# Patient Record
Sex: Male | Born: 1945
Health system: Southern US, Community
[De-identification: ages and names within clinical notes are randomized; demographics above are authoritative.]

## PROBLEM LIST (undated history)

## (undated) DIAGNOSIS — H409 Unspecified glaucoma: Secondary | ICD-10-CM

## (undated) DIAGNOSIS — G4730 Sleep apnea, unspecified: Secondary | ICD-10-CM

## (undated) DIAGNOSIS — M1990 Unspecified osteoarthritis, unspecified site: Secondary | ICD-10-CM

## (undated) DIAGNOSIS — F419 Anxiety disorder, unspecified: Secondary | ICD-10-CM

## (undated) DIAGNOSIS — J449 Chronic obstructive pulmonary disease, unspecified: Secondary | ICD-10-CM

## (undated) DIAGNOSIS — F32A Depression, unspecified: Secondary | ICD-10-CM

## (undated) DIAGNOSIS — N400 Enlarged prostate without lower urinary tract symptoms: Secondary | ICD-10-CM

## (undated) DIAGNOSIS — K219 Gastro-esophageal reflux disease without esophagitis: Secondary | ICD-10-CM

## (undated) DIAGNOSIS — I10 Essential (primary) hypertension: Secondary | ICD-10-CM

## (undated) DIAGNOSIS — T7840XA Allergy, unspecified, initial encounter: Secondary | ICD-10-CM

## (undated) HISTORY — PX: HERNIA REPAIR: SHX51

## (undated) HISTORY — DX: Unspecified glaucoma: H40.9

## (undated) HISTORY — DX: Depression, unspecified: F32.A

## (undated) HISTORY — DX: Anxiety disorder, unspecified: F41.9

## (undated) HISTORY — DX: Allergy, unspecified, initial encounter: T78.40XA

## (undated) HISTORY — PX: KNEE ARTHROSCOPY: SUR90

## (undated) HISTORY — PX: OTHER SURGICAL HISTORY: SHX169

## (undated) HISTORY — PX: COLONOSCOPY: SHX174

## (undated) HISTORY — DX: Essential (primary) hypertension: I10

## (undated) HISTORY — DX: Sleep apnea, unspecified: G47.30

## (undated) HISTORY — PX: SHOULDER SURGERY: SHX246

## (undated) HISTORY — PX: APPENDECTOMY: SHX54

---

## 1999-07-06 ENCOUNTER — Emergency Department (HOSPITAL_COMMUNITY): Admission: EM | Admit: 1999-07-06 | Discharge: 1999-07-06 | Payer: Self-pay | Admitting: Emergency Medicine

## 2004-06-02 ENCOUNTER — Ambulatory Visit (HOSPITAL_COMMUNITY): Admission: RE | Admit: 2004-06-02 | Discharge: 2004-06-02 | Payer: Self-pay | Admitting: Internal Medicine

## 2006-02-06 ENCOUNTER — Inpatient Hospital Stay (HOSPITAL_COMMUNITY): Admission: EM | Admit: 2006-02-06 | Discharge: 2006-02-07 | Payer: Self-pay | Admitting: Emergency Medicine

## 2006-04-09 ENCOUNTER — Ambulatory Visit (HOSPITAL_COMMUNITY): Admission: RE | Admit: 2006-04-09 | Discharge: 2006-04-09 | Payer: Self-pay | Admitting: *Deleted

## 2007-08-25 ENCOUNTER — Ambulatory Visit (HOSPITAL_COMMUNITY): Admission: RE | Admit: 2007-08-25 | Discharge: 2007-08-25 | Payer: Self-pay | Admitting: Orthopedic Surgery

## 2010-06-26 ENCOUNTER — Encounter: Payer: Self-pay | Admitting: Internal Medicine

## 2010-10-20 NOTE — Consult Note (Signed)
Robert, Adams NO.:  [id number]   MEDICAL RECORD NO.:  [id number]          PATIENT TYPE:  INP   LOCATION:  4736                         FACILITY:  [facility]   PHYSICIAN:  Peter M. Swaziland, M.D.  DATE OF BIRTH:  Jan 14, 1946   DATE OF CONSULTATION:  02/07/2006  DATE OF DISCHARGE:                                   CONSULTATION   HISTORY OF PRESENT ILLNESS:  Robert Adams is a very pleasant 65 year old white  male, who presented yesterday for evaluation of chest pain.  He was out  working in his workshop when he developed sudden onset of a sharp pain  localized to his right lower anterior rib cage.  The patient states he just  sat down for 20 minutes and after proximal 25 minutes, his pain resolved.  He has had no further chest pain since then.  He did have mild discomfort in  his right cheek.  He denied any diaphoresis, nausea, vomiting, and had only  mild shortness of breath.  The patient states he has had prior cardiac work-  up with a cardiac catheterization in 2003 when he was in [place],  [place].  It was noted that he had some mild plaque at that time, up to  30% but no obstructive disease.  He reports he had a routine stress test 1  year ago with Dr. Ricki Miller and that this was normal.  He has no known history of  hypercholesterolemia or diabetes or hypertension.  He is a smoker.   PAST MEDICAL HISTORY:  1. Tobacco use.  2. Chronic rash in his groin.  3. Status post right shoulder and knee surgery.  4. Status post appendectomy.  5. History of hypoglycemia.  6. History of asbestosis and radiation exposure.  7. BPH.   His prior medications include Flomax 0.4 mg daily.  He has no known  allergies.   FAMILY HISTORY:  Both parents died of natural causes.  He has 2 siblings  with atrial fibrillation.   SOCIAL HISTORY:  The patient lives with his wife.  He is retired.  He  previously worked as a Academic librarian.  He has had previous asbestos exposure.  He  smokes 1 pack per day since his teenage years.  He denies alcohol use.   REVIEW OF SYSTEMS:  He has had recent weight loss of 15-20 pounds, and he  has had a chronic rash in his groin.  He has history of chronic insomnia.  Other review of systems are negative.   PHYSICAL EXAM:  GENERAL:  The patient is pleasant white male in no apparent  distress.  VITAL SIGNS:  His blood pressure is 130/80, his pulse is 70 and regular, his  respirations are 18, he is afebrile.  Saturations are 99% on room air.  HEENT:  He is normocephalic and atraumatic.  Pupils equal, round, reactive  to light and accommodation.  He wears a moustache.  His oropharynx is clear.  NECK:  Supple without JVD, adenopathy, thyromegaly or bruits.  LUNGS:  Clear to auscultation and percussion.  CARDIAC:  Regular rate and rhythm  without gallop, murmur, rub or click.  ABDOMEN:  Soft, nontender, without as splenomegaly, masses or bruits.  EXTREMITIES:  His femoral and pedal pulses were 2+ and symmetric.  He has no  lower extremity edema.   LABORATORY DATA:  His ECG shows normal sinus rhythm.  There is a question of  Q-waves in lead V2.  Cannot rule out septal infarct, age undetermined.  There are no serial ECG changes, and he has minor nonspecific ST  abnormality.  Chest x-ray showed some atelectasis in the bases.  He had a CT  of chest which showed bilateral lower lobe scarring and right middle lobe  scarring without active disease.  Additional laboratory data, his  electrolytes were normal.  Renal function is normal.  His CBC was normal.  Coags were normal.  He has had 5 sets of cardiac enzymes, all of which are  normal.   IMPRESSION:  1. Atypical chest pain.  The patient is ruled out for myocardial      infarction.  Cardiac evaluation in the past has been unremarkable.  2. Tobacco use.  3. Chronic pulmonary scarring.  4. Chronic rash in the groin  5. Weight loss.   PLAN:  I think it would be appropriate to let Mr.  Adams be discharged home  today.  We have counseled him on smoking cessation.  We can arrange him to  have a follow-up stress Cardiolite study as an outpatient and will call to  arrange this.           ______________________________  Peter M. Swaziland, M.D.     PMJ/MEDQ  D:  02/07/2006  T:  02/07/2006  Job:  [id number]   cc:   Isidor Holts, M.D.  Juline Patch, M.D.

## 2010-10-20 NOTE — H&P (Signed)
NAMEHYLAND, MOLLENKOPF NO.:  [id number]   MEDICAL RECORD NO.:  [id number]          PATIENT TYPE:  EMS   LOCATION:  MAJO                         FACILITY:  [facility]   PHYSICIAN:  Lonia Blood, M.D.DATE OF BIRTH:  [birth year]   DATE OF ADMISSION:  02/06/2006  DATE OF DISCHARGE:                                HISTORY & PHYSICAL   PRIMARY CARE PHYSICIAN:  Juline Patch, M.D.   CHIEF COMPLAINT:  Chest pain.   HISTORY OF PRESENT ILLNESS:  [name] is a very pleasant 65-year-  old gentleman. He reports the acute onset this morning of a right-sided  stabbing chest pain.  This occurred while he was out working in his shop.  He denies that he was physically exerting himself but he was moving around.  The pain was sudden.  The pain radiated up into his right neck and into his  jaw.  This pain was associated with the sudden sense of dyspnea and mild  diaphoresis.  The pain seemed to get better when the patient sat down to  rest.  After approximately 20-30 minutes the patient seemed to have resolved  completely.  He reported the symptoms to his wife who then encouraged him to  seek further attention in the emergency room.  When I asked the patient if  he has had recent symptoms,  he denies.  He does report,  however, that he  has been losing weight unintentionally.  He states he has lost anywhere from  15-20 pounds since January.  He states he simply just does not have an  appetite.  He does not have any melena or hematochezia.  He has not had  abdominal pain.  There has not been hot or cold intolerance.  There has not  been any change in his hair or skin texture.  The patient simply reports  that he has not had much of an appetite.  Wife does support the fact that he  has lost significant weight.  She also states he has very little energy and  just does not seem himself lately.  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  He reports that he was exposed to asbestos on a daily basis for greater than  10 years.  He has also smoked since he was a teenager.  He has no known  history of diabetes but is hyperglycemic in the emergency room.  His lipid  status is unknown.  His family history is not significant for coronary  disease.  At present he feels much better.  He is having no pain.  He is  having no trouble breathing.  He states he is at his baseline.   Of note, the patient does report, however, that he has been told that he  had a heart attack in the past although he was not aware of this.  He also  had a hospitalization in [place] back in 2003 with the diagnosis of  chest pain.  At that time he apparently  underwent a cardiac catheterization  and was told that he had some mild blockages but nothing that needed  treatment then.  He has not sought cardiac evaluation or medical attention  concerning his coronary disease since that time.   REVIEW OF SYSTEMS:  Comprehensive review of systems is unremarkable with the  exception of the multiple positive illnesses noted above.  Additionally, the  patient does report a chronic erythematous, pruritic rash in his groin and  on kissing regions of the medial thigh.  He states that this has been  present since he was in the [place] but much worse actually since the 90s.  He states he has seen many, many physicians including multiple  dermatologists as well as dermatology departments at [facility] and at [facility] and that no one has been able to diagnose this appropriately.  He  states he has been on numerous courses of antifungals, anti-inflammatories  and steroids, both topical and systemic and that none of them make any  difference.   PAST MEDICAL HISTORY:  1. Tobacco abuse, one pack per day since teen years.  2. Chronic rash to the groin as discussed above.  3. Status post right shoulder and bilateral knee orthopedic procedures.  4.  Status post bilateral inguinal hernia repairs.  5. Status post appendectomy.  6. Cardiac catheterization in 2003 in [place] with report of mild      blockage.  7. 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   MEDICATIONS:  Flomax 0.4 mg p.o. q.d.   ALLERGIES:  NO KNOWN DRUG ALLERGIES.   FAMILY HISTORY:  Mother is deceased from natural causes, father is deceased  from natural causes.  Patient has a brother and sister, both suffer with  atrial fibrillation but none of whom have a history of coronary artery  disease.  There is no known history of early coronary disease in the  patient's extended family.   SOCIAL HISTORY/OCCUPATION:  He is married. He lives in [place] with his  wife.  He  retired from Building [employer] in January of this year.  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  He  also served in [employer] in this capacity.   DATA:  Sodium, potassium, chloride, bicarb, BUN and creatinine are normal.  Serum glucose is elevated at 115; pH is 7.39, pcO2 is 41, pO2 is not  obtained on a venous scan.  Myoglobin, CK-MB and troponin were all negative.  Chest x-ray reveals bibasilar plate-like atelectasis versus scarring;  12-  lead EKG is normal sinus rhythm at 68 beats-per-minute with ST depression  notable in leads II and aVF as well as Q waves which are minimally  significant in lead I and definitely significant in aVL with J point type  elevation in leads V1 through V3.   PHYSICAL EXAMINATION:  VITAL SIGNS:  Temperature 97.2, blood pressure  131/80, heart rate 71, respiratory rate 18, O2 sat 99% on room air.  GENERAL:  Well-developed, well-nourished male in no acute respiratory  distress at the present time.  HEENT:  Normocephalic, atraumatic. Pupils equal, round and reactive to light and accommodation.  Extraocular muscles intact bilaterally.  OC/OP  clear.  NECK:  No JVD, no lymphadenopathy, no thyromegaly.  LUNGS:  Clear to auscultation throughout although breath sounds are distant.  There are no focal crackles, there are no wheezes at present.  CARDIOVASCULAR:  Regular rate and  rhythm without murmurs, rubs, or gallops.  Normal S1 and S2.  ABDOMEN:  Nontender, nondistended, soft, bowel sounds present, no  hepatosplenomegaly.  No rebound, no ascites.  EXTREMITIES:  No significant clubbing, cyanosis or edema bilateral lower  extremities.  CUTANEOUS:  The patient does have a macular erythematous rash in the groin  on the scrotum spreading onto the base of the penis and also within the  groin and spreading onto bilateral medial thighs.  There is no evidence of  abscess formation.  There is no purulent discharge.  There is no fluctuans.  There is no apparent break of the skin.  NEUROLOGIC:  Cranial nerves II through XII are intact, Strenth is 5/5  strength bilateral upper and lower extremities. Alert and oriented x4, no  Babinski, 1+ DTRs throughout.   IMPRESSION AND PLAN:  1. Chest pain--Robert Adams's chest pain is in fact atypical.  He describes      it as a sharp and stabbing type pain.  Nonetheless it did radiate to      the face and was associated with diaphoresis and shortness of breath.      It improved when  he rested.  Given his questionable history of mild      coronary disease in the past, I am concern that this could, in fact,      represent coronary disease and an anginal equivalent.  His EKG is also      somewhat alarming.  I feel it is in his best interest for [place] to      evaluate him with rule out using serial EKGs and enzymes.  I am not      sufficiently concerned that this is unstable angina that I am dosing      the patient with full dose heparin or nitroglycerin.  I will      empirically place him on a beta blocker, ACE inhibitor and aspirin      therapy.  Lovenox will be dosed for DVT prophylaxis alone.  If  cardiac      enzymes are positive, the patient may require risk stratification      during his hospital stay.  2. 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  He has a      longstanding history of asbestos exposure and also smokes.  Given his      right-sided atypical chest pain and his recent history of weight loss,      I am concerned that the patient could be suffering with      asbestosis/mesothelioma or even a primary lung cancer.  I will scan the      patient's chest for further evaluation.  3. Hyperglycemia--the patient has no known history of diabetes mellitus.      His blood sugar is 115 on a serum glucose.  This is markedly elevated.      We will check a hemoglobin A1c.  If this is elevated, we will check a      fasting CBG.  We will otherwise follow CBGs for the initial 36 hours.     This could simply be secondary to dextrose-containing IV fluids      administered in the ER  but it is not clear to me if he has actually      received D5 fluid or not.  4. Groin rash--this is indeed a chronic issue  for this patient.  On gross      physical examination, I am not able to make a diagnosis.  This is a      longstanding problem that will  need outpatient followup.  The patient      is not interested in any trials of antimicrobial, antifungal or topical      treatments at this time as he states he has literally tried them all.  5. Tobacco abuse--I have counseled the patient on  an absolute need to      discontinue tobacco abuse altogether.  Tobacco cessation consultation      will be requested during this stay to further encourage his      discontinuation of tobacco abuse.      Lonia Blood, M.D.  Electronically Signed     JTM/MEDQ  D:  02/06/2006  T:  02/06/2006  Job:  [id number]

## 2010-10-20 NOTE — Op Note (Signed)
NAMEKOJI, NIEHOFF                ACCOUNT NO.:  [id number]   MEDICAL RECORD NO.:  [id number]          PATIENT TYPE:  AMB   LOCATION:  ENDO                         FACILITY:  [facility]   PHYSICIAN:  Georgiana Spinner, M.D.    DATE OF BIRTH:  02-Aug-1945   DATE OF PROCEDURE:  04/09/2006  DATE OF DISCHARGE:                                 OPERATIVE REPORT   PROCEDURE:  Colonoscopy.   INDICATIONS:  Colon cancer screening.   ANESTHESIA:  Demerol 100, Versed 10 mg.   PROCEDURE:  With the patient mildly sedated in left lateral decubitus  position, a rectal exam was performed which was unremarkable.  Subsequently  the Olympus videoscopic colonoscope was inserted into the rectum and passed  under direct vision to the cecum identified by ileocecal valve and  appendiceal orifice, both of which were photographed.  From this point, the  colonoscope was slowly withdrawn taking circumferential views of colonic  mucosa stopping only in the rectum which appeared normal on direct and  showed hemorrhoids on retroflexed view.  The endoscope was straightened and  withdrawn.  The patient's vital signs, pulse oximeter remained stable.  The  patient tolerated the procedure well without apparent complications.   FINDINGS:  Internal hemorrhoids otherwise unremarkable colonoscopic  examination to the cecum.   PLAN:  Have patient follow up with me probably in 5 years or as needed.           ______________________________  Georgiana Spinner, M.D.     GMO/MEDQ  D:  04/09/2006  T:  04/09/2006  Job:  [id number]

## 2010-10-20 NOTE — Discharge Summary (Signed)
NAMELONNIE, RETH                ACCOUNT NO.:  [id number]   MEDICAL RECORD NO.:  [id number]          PATIENT TYPE:  INP   LOCATION:  4736                         FACILITY:  [facility]   PHYSICIAN:  Isidor Holts, M.D.  DATE OF BIRTH:  1946-04-27   DATE OF ADMISSION:  02/06/2006  DATE OF DISCHARGE:  02/07/2006                                 DISCHARGE SUMMARY   DISCHARGE DIAGNOSES:  1. Atypical chest pain.  2. Smoking history.  3. Hyperglycemia.   DISCHARGE MEDICATIONS:  1. Enteric coated aspirin 81 mg daily.  2. Lopressor 12.5 mg p.o. b.i.d.  3. Flomax 0.4 mg p.o. daily.  4. Nitroglycerine 0.4 mg p.r.n. sublingually q.5 minutes for chest pain.   PROGRESS NOTE:  1. Chest x-ray dated February 06, 2006 that showed bibasilar atelectasis      or scarring.  2. Chest CT scan dated February 06, 2006.  This showed no evidence of      mass or other active disease, though with mild right middle lobe and      lower lobe scarring, thoracic spine degenerative changes also noted.   CONSULTATIONS:  Dr. Sw[place]and, cardiologist.   ADMISSION HISTORY:  Please see H&P notes of February 06, 2006 dictated by  Dr. Reather Littler. However, in brief, this is a 65 year old male, with known  history of tobacco abuse, chronic dermatitis of the groin, history of non-  obstructive coronary artery disease, as well as DJD, who presents with  atypical-sounding chest pain, located on the right of his chest, radiating  up into the neck and into the jaw, associated with dyspnea, marked  diaphoresis of approximately 20-30 minutes duration.  Systems review also  revealed some weight loss, approximately 15-20 pounds since January 2007 and  decreased appetite as well as fatigue.  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  He had served in the [employer].  He is currently  retired from [employer].   INVESTIGATION AND EVALUATION:   CLINICAL COURSE:  1. Atypical chest pain.  The patient presents with chest pain, which from      the description, was atypical for coronary artery disease, described as      sharp, right sided, radiating into the right side of the neck and the      jaw.  However the patient is a rather heavy smoker, and apparently, in      2003, had a cardiac catheterization, which showed non-obstructive      coronary artery disease.  Given his age and gender the patient does      have some risk factors for coronary artery disease. cardiac enzymes      were cycled, and these remained unelevated.  EKG showed no acute      ischemic changes, although it was reportedly abnormal, with ST      depression in leads 2 and AVF, T-waves in lead 1 and AVL with high take-  off ST elevation in leads V1 through V3.  The patient was placed on      Aspirin, beta blockers, and cardiology consultation was called.  He      remained asymptomatic throughout the rest of his hospital stay and was      seen on February 07, 2006 by Dr. Sw[place]il[place], cardiologist, who concluded      that the patient does not have acute coronary syndrome and was      currently stable for workup with stress test on an outpatient basis.   1. Smoking history.  The patient has been counseled appropriately.  He has      declined Nicoderm CQ patch and wants to quit cold [place].   1. History of asbestos exposure.  This against the background of fatigue      with weight loss, raises concern for possible asbestos-related      disease/mesothelioma.  Chest CT scan however was negative.  The patient      has therefore been reassured accordingly.   1. Hyperglycemia.  At the time of presentation, the patient was found to      have a blood glucose of 115 raising some concern for possible      hyperglycemia.  The patient has no known history of diabetes mellitus.      His blood glucose  was monitored during the course of his hospital stay      and he remained normoglycemic. It was recommended that he follow a      carbohydrate-modified diet however, and we expect his primary M.D. to      check his blood sugar periodically.   DISPOSITION:  The patient was discharged in satisfactory condition on  February 07, 2006.   DIET:  Heart Healthy/carbohydrate modified.   ACTIVITY:  As tolerated.   WOUND CARE:  Not applicable.   PAIN MANAGEMENT:  Not applicable.   FOLLOW UP:  The patient is to follow up with Dr.Pang, his PMD within 1-2  weeks of discharge.  He is to also follow up with Dr. Swaziland, cardiologist.  Dr. Elvis Coil office has undertaken to contact the patient.   SPECIAL INSTRUCTIONS:  Outpatient stress test to be arranged by Dr. Sw[place]iland.  Dr. Ricki Miller, PMD to monitor the patient's blood glucose periodically.   SUMMARY:      Isidor Holts, M.D.  Electronically Signed     CO/MEDQ  D:  02/07/2006  T:  02/07/2006  Job:  [id number]   cc:   Juline Patch, M.D.  Peter M. Swazil[place], M.D.

## 2011-01-08 ENCOUNTER — Other Ambulatory Visit: Payer: Self-pay | Admitting: Internal Medicine

## 2011-01-08 DIAGNOSIS — I83812 Varicose veins of left lower extremities with pain: Secondary | ICD-10-CM

## 2011-01-17 ENCOUNTER — Ambulatory Visit
Admission: RE | Admit: 2011-01-17 | Discharge: 2011-01-17 | Disposition: A | Discharge status: Home / Self Care | Payer: Medicare Other | Source: Ambulatory Visit | Attending: Internal Medicine | Admitting: Internal Medicine

## 2011-01-17 ENCOUNTER — Other Ambulatory Visit: Payer: Self-pay | Admitting: Internal Medicine

## 2011-01-17 VITALS — BP 144/82 | HR 67 | Temp 97.9°F | Resp 16 | Ht 71.0 in | Wt 152.0 lb

## 2011-01-17 DIAGNOSIS — I83812 Varicose veins of left lower extremities with pain: Secondary | ICD-10-CM

## 2011-01-17 HISTORY — DX: Unspecified osteoarthritis, unspecified site: M19.90

## 2011-01-17 NOTE — Progress Notes (Signed)
Pt complaining of Left popliteal pain.  States that the pain occurs at frequent intervals, sometimes lasting only 2-3 days, sometimes over several weeks.    Describes the pain as sharp & at times associated with cramping sensation.     Pt given Rx for thigh high graduated compression garment (20-30 mm Hg) to use as directed (per Dr. Deanne Coffer).  Will check with pt's insurance company re:  preauthorization for Sclerotherapy.

## 2011-02-13 ENCOUNTER — Telehealth: Payer: Self-pay | Admitting: Emergency Medicine

## 2011-03-19 NOTE — Telephone Encounter (Signed)
SEE TELEPHONE NOTE

## 2011-03-27 ENCOUNTER — Telehealth: Payer: Self-pay | Admitting: Emergency Medicine

## 2011-03-27 NOTE — Telephone Encounter (Signed)
LM FOR PT. TO CALL us TO SET UP F/U CONSERVATIVE TX AFTER HE HAS BEGUN TO WEAR THE COMPRESSION STOCKINGS.

## 2011-04-02 ENCOUNTER — Telehealth: Payer: Self-pay | Admitting: Emergency Medicine

## 2011-04-02 NOTE — Telephone Encounter (Signed)
Pt. Called and just started wearing the compression stockings.  We will see him back in 92mo for f/u appt.

## 2011-04-19 ENCOUNTER — Other Ambulatory Visit: Payer: Self-pay | Admitting: Orthopedic Surgery

## 2011-04-20 ENCOUNTER — Encounter (HOSPITAL_BASED_OUTPATIENT_CLINIC_OR_DEPARTMENT_OTHER): Payer: Self-pay | Admitting: *Deleted

## 2011-04-23 ENCOUNTER — Ambulatory Visit (HOSPITAL_BASED_OUTPATIENT_CLINIC_OR_DEPARTMENT_OTHER)
Admission: RE | Admit: 2011-04-23 | Discharge: 2011-04-23 | Disposition: A | Discharge status: Home / Self Care | Payer: Medicare Other | Source: Ambulatory Visit | Attending: Orthopedic Surgery | Admitting: Orthopedic Surgery

## 2011-04-23 ENCOUNTER — Other Ambulatory Visit: Payer: Self-pay

## 2011-04-23 DIAGNOSIS — M19019 Primary osteoarthritis, unspecified shoulder: Secondary | ICD-10-CM | POA: Insufficient documentation

## 2011-04-23 DIAGNOSIS — M719 Bursopathy, unspecified: Secondary | ICD-10-CM | POA: Insufficient documentation

## 2011-04-23 DIAGNOSIS — M7500 Adhesive capsulitis of unspecified shoulder: Secondary | ICD-10-CM | POA: Insufficient documentation

## 2011-04-23 DIAGNOSIS — Z01810 Encounter for preprocedural cardiovascular examination: Secondary | ICD-10-CM | POA: Insufficient documentation

## 2011-04-23 DIAGNOSIS — Z5333 Arthroscopic surgical procedure converted to open procedure: Secondary | ICD-10-CM | POA: Insufficient documentation

## 2011-04-23 DIAGNOSIS — K219 Gastro-esophageal reflux disease without esophagitis: Secondary | ICD-10-CM | POA: Insufficient documentation

## 2011-04-23 DIAGNOSIS — M67919 Unspecified disorder of synovium and tendon, unspecified shoulder: Secondary | ICD-10-CM | POA: Insufficient documentation

## 2011-04-23 NOTE — H&P (Signed)
March 14, 2011   Juline Patch, MD Fax# [phone]  RE: Robert Adams DOB: 08/02/1945 MEDICARE   Dear Dr. Ricki Miller:  Robert Adams returns after 2 attempted to obtain an MRI of the right shoulder.  Even though no metal fragments are visible on his plain x-rays, he has considerable metal artifact remaining from his prior shoulder arthroscopy in the 1980's. This destroyed our ability to obtain an accurate image of his rotator cuff. He does have a very large osteophyte at the distal clavicle and medial acromion. This is causing indentation of the rotator cuff. More likely than not he has a partial or complete rotator cuff tear beneath the spur.   Despite 2 attempts by [facility] technologist to obtain proper images with fat saturation modification no adequate images were obtained.   In this setting we have advised Robert Adams to proceed with direct arthroscopy. We will perform subacromial decompression, distal clavicle resection and repair of any pathology we identify with direct inspection. The surgery, after care, risks and benefits were described in detail. He would like to schedule surgery sometime in November.   I will keep you informed of his progress. Thank you for allowing me to participate in the care of your patients.   Best regards,    Katy Fitch. Naaman Plummer., MD RVS/phe T: 03-15-11

## 2011-04-23 NOTE — H&P (Signed)
March 02, 2011      Juline Patch, MD [address] [place], [place] [address] FAX # [phone]  RE: Robert Adams  DOB:   [birth year]  [employer]   Dear Dr. Ricki Miller:  Robert Adams presented for evaluation of a painful right shoulder.  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  He had a history of right shoulder pain treated by Dr. Debria Garret with right shoulder arthroscopy and open acromioplasty in 1987.  Postoperatively he had a lengthy rehab, but ultimately recovered pain free motion.  During the past four months he has had severe pain in his right shoulder, weakness of abduction, external rotation, marked limitation in motion, night pain and difficulty sleeping.  He has been using Mobic without relief.  You sent him for plain films of the shoulder obtained at [facility].  These were interpreted by Dr. Purcell Mouton to reveal significant acromioclavicular and glenohumeral degenerative changes.  There was no sign of a fracture.    Due to his persistent pain he seeks an upper extremity orthopaedic consult.    Robert Adams's past medical history is reviewed in detail.  He is 5'11" tall and weighs 153 pounds.  His pain is constant, severe, sharp in quality associated with weakness of abduction, flexion and rotation.  It is getting worse over time.  He cannot sleep.  Meloxicam 15 mg. daily does not solve his pain predicament.  His past history reveals that he has no drug allergies.  Current medications, tamsulosin 0.4 mg. daily, clonazepam 0.5 mg. daily and the aforementioned meloxicam 50 mg. daily.    Prior surgery, right shoulder arthroscopy and open acromioplasty in 1987, bilateral knee surgery in 1990's, herniorrhaphy and appendectomy.  Social history reveals that he is married, he is a nonsmoker, he does not drink alcoholic beverages.   Family history detailed and positive for diabetes, hypertension and  arthritis.  14-point review of systems reveals weight loss, poor appetite, history of corrective lenses for presbyopia, rash, sleep impairment and easy bruising.   Juline Patch, MD Page 2 March 02, 2011   RE: Robert Adams  DOB:   [birth year]  [employer]   Physical examination reveals a thin, well appearing 65 year-old gentleman.  His shoulder range of motion is markedly restricted on the right.  He has combined elevation 50 degrees on the right limited by pain.  He has virtually no rotation.  He can internally rotate to the iliac crest vs. elevation 165, external rotation 70, internal rotation T-10 and extension interscapular plane plus 5 degrees ahead of the plane on the left.    Plain x-rays of his shoulder document advanced degenerative change of the glenohumeral joint, AC joint and signs of a prior robust anterolateral acromioplasty.  He has calcification at the site of the supraspinatus tendon and irregularity at the greater tuberosity consistent with rotator cuff disease.    ASSESSMENT:   1)  Severe adhesive capsulitis.   2)  AC degenerative arthritis.        3)  Moderate glenohumeral degenerative arthritis.  Due to his marked pain with any activation of his rotator cuff he is sent for MRI to rule out cuff tear.  This is a difficult combination of adhesive capsulitis, arthritis and cuff disease.    Based on the findings of the MRI we will develop a reconstruction plan for his shoulder.    I will keep you informed of his progress.  Best regards,     Katy Fitch. Geonna Lockyer, M.D., Montez Hageman.  RVS:cmf T:  10.1.12

## 2011-04-23 NOTE — H&P (Signed)
Robert Adams is an 65 y.o. male.   Chief Complaint: c/o chronic and progressive right shoulder pain. HPI: Pt is a 65 y/o right hand dominant male who presented c/o chronic and progressive right shoulder pain.  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  He had a history of right shoulder pain treated by Dr. Debria Adams with right shoulder arthroscopy and open acromioplasty in 1987.  Postoperatively he had a lengthy rehab, but ultimately recovered pain free motion.  During the past four months he has had severe pain in his right shoulder, weakness of abduction, external rotation, marked limitation in motion, night pain and difficulty sleeping.  He has been using Mobic without relief. Plain films of the shoulder obtained at [facility].  These were interpreted to reveal significant acromioclavicular and glenohumeral degenerative changes.  There was no sign of a fracture.     Past Medical History  Diagnosis Date  . Arthritis   . GERD (gastroesophageal reflux disease)   . BPH (benign prostatic hyperplasia)     Past Surgical History  Procedure Date  . Appendectomy   . Arthroscopic surgery     both knees  . Right shoulder surgery   . Hernia repair     bilat ing   . Colonoscopy   . Knee arthroscopy     rt and lt    History reviewed. No pertinent family history. Social History:  reports that he quit smoking about 2 years ago. He has never used smokeless tobacco. He reports that he does not drink alcohol or use illicit drugs.  Allergies: No Known Allergies  No current facility-administered medications on file as of .   Medications Prior to Admission  Medication Sig Dispense Refill  . ciprofloxacin (CIPRO) 500 MG tablet Take 500 mg by mouth Nightly.        . clonazePAM (KLONOPIN) 0.5 MG tablet Take 0.5 mg by mouth Nightly.        . finasteride (PROSCAR) 5 MG tablet Take 5 mg by mouth every evening.          . Tamsulosin HCl (FLOMAX) 0.4 MG CAPS Take by mouth Nightly.          No results found for this or any previous visit (from the past 48 hour(s)).  No results found.   Pertinent items are noted in HPI.  Height 5\' 11"  (1.803 m), weight 68.04 kg (150 lb).  General appearance: alert Head: Normocephalic, without obvious abnormality Neck: supple, symmetrical, trachea midline Resp: clear to auscultation bilaterally Cardio: regular rate and rhythm, S1, S2 normal, no murmur, click, rub or gallop GI: normal findings: bowel sounds normal Extremities:.  His shoulder range of motion is markedly restricted on the right.  He has combined elevation 50 degrees on the right limited by pain.  He has virtually no rotation.  He can internally rotate to the iliac crest vs. elevation 165, external rotation 70, internal rotation T-10 and extension interscapular plane plus 5 degrees ahead of the plane on the left.    Plain x-rays of his shoulder document advanced degenerative change of the glenohumeral joint, AC joint and signs of a prior robust anterolateral acromioplasty.  He has calcification at the site of the supraspinatus tendon and irregularity at the greater tuberosity consistent with rotator cuff disease.    Pulses: 2+ and symmetric Skin: WNL Neurologic: Grossly normal    Assessment/Plan :   1)  Severe adhesive  capsulitis.   2)  AC degenerative arthritis.        3)  Moderate glenohumeral degenerative arthritis. Plan: to OR for Right SA with SAD/DCR and repair rotator cuff as needed.  Robert Adams JR,Robert Adams V 04/23/2011, 6:48 PM

## 2011-04-24 ENCOUNTER — Encounter (HOSPITAL_BASED_OUTPATIENT_CLINIC_OR_DEPARTMENT_OTHER): Payer: Self-pay | Admitting: Orthopedic Surgery

## 2011-04-24 ENCOUNTER — Ambulatory Visit (HOSPITAL_BASED_OUTPATIENT_CLINIC_OR_DEPARTMENT_OTHER): Payer: Medicare Other | Admitting: Anesthesiology

## 2011-04-24 ENCOUNTER — Encounter (HOSPITAL_BASED_OUTPATIENT_CLINIC_OR_DEPARTMENT_OTHER): Payer: Self-pay

## 2011-04-24 ENCOUNTER — Ambulatory Visit (HOSPITAL_BASED_OUTPATIENT_CLINIC_OR_DEPARTMENT_OTHER)
Admission: RE | Admit: 2011-04-24 | Discharge: 2011-04-25 | Disposition: A | Discharge status: Home / Self Care | Payer: Medicare Other | Source: Ambulatory Visit | Attending: Orthopedic Surgery | Admitting: Orthopedic Surgery

## 2011-04-24 ENCOUNTER — Encounter (HOSPITAL_BASED_OUTPATIENT_CLINIC_OR_DEPARTMENT_OTHER): Payer: Self-pay | Admitting: Anesthesiology

## 2011-04-24 ENCOUNTER — Encounter (HOSPITAL_BASED_OUTPATIENT_CLINIC_OR_DEPARTMENT_OTHER)
Admission: RE | Disposition: A | Discharge status: Home / Self Care | Payer: Self-pay | Source: Ambulatory Visit | Attending: Orthopedic Surgery

## 2011-04-24 DIAGNOSIS — Z5333 Arthroscopic surgical procedure converted to open procedure: Secondary | ICD-10-CM | POA: Insufficient documentation

## 2011-04-24 DIAGNOSIS — M25819 Other specified joint disorders, unspecified shoulder: Secondary | ICD-10-CM | POA: Insufficient documentation

## 2011-04-24 DIAGNOSIS — M719 Bursopathy, unspecified: Secondary | ICD-10-CM | POA: Insufficient documentation

## 2011-04-24 DIAGNOSIS — Z01810 Encounter for preprocedural cardiovascular examination: Secondary | ICD-10-CM | POA: Insufficient documentation

## 2011-04-24 DIAGNOSIS — M67919 Unspecified disorder of synovium and tendon, unspecified shoulder: Secondary | ICD-10-CM | POA: Insufficient documentation

## 2011-04-24 DIAGNOSIS — M19019 Primary osteoarthritis, unspecified shoulder: Secondary | ICD-10-CM | POA: Insufficient documentation

## 2011-04-24 DIAGNOSIS — M6580 Other synovitis and tenosynovitis, unspecified site: Secondary | ICD-10-CM | POA: Insufficient documentation

## 2011-04-24 DIAGNOSIS — M7500 Adhesive capsulitis of unspecified shoulder: Secondary | ICD-10-CM | POA: Insufficient documentation

## 2011-04-24 DIAGNOSIS — K219 Gastro-esophageal reflux disease without esophagitis: Secondary | ICD-10-CM | POA: Insufficient documentation

## 2011-04-24 HISTORY — DX: Benign prostatic hyperplasia without lower urinary tract symptoms: N40.0

## 2011-04-24 HISTORY — DX: Gastro-esophageal reflux disease without esophagitis: K21.9

## 2011-04-24 SURGERY — ARTHROSCOPY, SHOULDER, WITH ROTATOR CUFF REPAIR
Anesthesia: General | Site: Shoulder | Laterality: Right | Wound class: Clean

## 2011-04-24 MED ORDER — METHOCARBAMOL 100 MG/ML IJ SOLN: 500.0000 mg | Freq: Four times a day (QID) | INTRAVENOUS | Status: DC | PRN

## 2011-04-24 MED ORDER — MENTHOL 3 MG MT LOZG: 1.0000 | LOZENGE | OROMUCOSAL | Status: DC | PRN

## 2011-04-24 MED ORDER — FLEET ENEMA 7-19 GM/118ML RE ENEM: 1.0000 | ENEMA | Freq: Every day | RECTAL | Status: DC | PRN

## 2011-04-24 MED ORDER — HYDROMORPHONE HCL 2 MG PO TABS
2.0000 mg | ORAL_TABLET | ORAL | Status: DC | PRN
  Administered 2011-04-24: 4 mg via ORAL
  Administered 2011-04-24: 2 mg via ORAL

## 2011-04-24 MED ORDER — POLYETHYLENE GLYCOL 3350 17 G PO PACK: 17.0000 g | PACK | Freq: Every day | ORAL | Status: DC | PRN

## 2011-04-24 MED ORDER — BISACODYL 5 MG PO TBEC: 10.0000 mg | DELAYED_RELEASE_TABLET | Freq: Every day | ORAL | Status: DC | PRN

## 2011-04-24 MED ORDER — ZOLPIDEM TARTRATE 5 MG PO TABS: 5.0000 mg | ORAL_TABLET | Freq: Every evening | ORAL | Status: DC | PRN

## 2011-04-24 MED ORDER — FENTANYL CITRATE 0.05 MG/ML IJ SOLN
50.0000 ug | INTRAMUSCULAR | Status: DC | PRN
  Administered 2011-04-24: 100 ug via INTRAVENOUS

## 2011-04-24 MED ORDER — SODIUM CHLORIDE 0.9 % IR SOLN
Status: DC | PRN
  Administered 2011-04-24 (×6): 3000 mL

## 2011-04-24 MED ORDER — ONDANSETRON HCL 4 MG/2ML IJ SOLN: 4.0000 mg | Freq: Four times a day (QID) | INTRAMUSCULAR | Status: DC | PRN

## 2011-04-24 MED ORDER — DOCUSATE SODIUM 100 MG PO CAPS: 100.0000 mg | ORAL_CAPSULE | Freq: Two times a day (BID) | ORAL | Status: DC

## 2011-04-24 MED ORDER — DEXTROSE-NACL 5-0.45 % IV SOLN
INTRAVENOUS | Status: DC
  Administered 2011-04-24: 22:00:00 via INTRAVENOUS

## 2011-04-24 MED ORDER — ROPIVACAINE HCL 5 MG/ML IJ SOLN
INTRAMUSCULAR | Status: DC | PRN
  Administered 2011-04-24: 30 mL via EPIDURAL

## 2011-04-24 MED ORDER — PROPOFOL 10 MG/ML IV EMUL
INTRAVENOUS | Status: DC | PRN
  Administered 2011-04-24: 200 mg via INTRAVENOUS

## 2011-04-24 MED ORDER — HYDROMORPHONE HCL PF 1 MG/ML IJ SOLN: 0.2500 mg | INTRAMUSCULAR | Status: DC | PRN

## 2011-04-24 MED ORDER — PROMETHAZINE HCL 25 MG/ML IJ SOLN: 12.5000 mg | Freq: Four times a day (QID) | INTRAMUSCULAR | Status: DC | PRN

## 2011-04-24 MED ORDER — LACTATED RINGERS IV SOLN
INTRAVENOUS | Status: DC
  Administered 2011-04-24 (×4): via INTRAVENOUS

## 2011-04-24 MED ORDER — METHOCARBAMOL 500 MG PO TABS: 500.0000 mg | ORAL_TABLET | Freq: Four times a day (QID) | ORAL | Status: DC | PRN

## 2011-04-24 MED ORDER — LIDOCAINE-EPINEPHRINE (PF) 2 %-1:200000 IJ SOLN
INTRAMUSCULAR | Status: DC | PRN
Start: 2011-04-24 — End: 2011-04-24
  Administered 2011-04-24: 40 mL via INTRADERMAL

## 2011-04-24 MED ORDER — TAMSULOSIN HCL 0.4 MG PO CAPS: 0.4000 mg | ORAL_CAPSULE | Freq: Every evening | ORAL | Status: DC

## 2011-04-24 MED ORDER — ONDANSETRON HCL 4 MG/2ML IJ SOLN
INTRAMUSCULAR | Status: DC | PRN
  Administered 2011-04-24: 4 mg via INTRAVENOUS

## 2011-04-24 MED ORDER — HYDROMORPHONE HCL PF 1 MG/ML IJ SOLN: 0.5000 mg | INTRAMUSCULAR | Status: DC | PRN

## 2011-04-24 MED ORDER — METOCLOPRAMIDE HCL 5 MG/ML IJ SOLN: 5.0000 mg | Freq: Three times a day (TID) | INTRAMUSCULAR | Status: DC | PRN

## 2011-04-24 MED ORDER — MIDAZOLAM HCL 2 MG/2ML IJ SOLN
1.0000 mg | INTRAMUSCULAR | Status: DC | PRN
  Administered 2011-04-24: 2 mg via INTRAVENOUS

## 2011-04-24 MED ORDER — IBUPROFEN 600 MG PO TABS: 600.0000 mg | ORAL_TABLET | Freq: Four times a day (QID) | ORAL | Status: AC | PRN

## 2011-04-24 MED ORDER — ACETAMINOPHEN 325 MG PO TABS: 650.0000 mg | ORAL_TABLET | Freq: Four times a day (QID) | ORAL | Status: DC | PRN

## 2011-04-24 MED ORDER — BISACODYL 10 MG RE SUPP: 10.0000 mg | Freq: Every day | RECTAL | Status: DC | PRN

## 2011-04-24 MED ORDER — ONDANSETRON HCL 4 MG PO TABS: 4.0000 mg | ORAL_TABLET | Freq: Four times a day (QID) | ORAL | Status: DC | PRN

## 2011-04-24 MED ORDER — CHLORHEXIDINE GLUCONATE 4 % EX LIQD: 60.0000 mL | Freq: Once | CUTANEOUS | Status: DC

## 2011-04-24 MED ORDER — EPHEDRINE SULFATE 50 MG/ML IJ SOLN
INTRAMUSCULAR | Status: DC | PRN
  Administered 2011-04-24 (×2): 10 mg via INTRAVENOUS

## 2011-04-24 MED ORDER — METOCLOPRAMIDE HCL 5 MG PO TABS: 5.0000 mg | ORAL_TABLET | Freq: Three times a day (TID) | ORAL | Status: DC | PRN

## 2011-04-24 MED ORDER — DEXAMETHASONE SODIUM PHOSPHATE 4 MG/ML IJ SOLN
INTRAMUSCULAR | Status: DC | PRN
  Administered 2011-04-24: 10 mg via INTRAVENOUS

## 2011-04-24 MED ORDER — FINASTERIDE 5 MG PO TABS: 5.0000 mg | ORAL_TABLET | Freq: Every evening | ORAL | Status: DC

## 2011-04-24 MED ORDER — CEFAZOLIN SODIUM 1-5 GM-% IV SOLN
1.0000 g | Freq: Four times a day (QID) | INTRAVENOUS | Status: DC
  Administered 2011-04-24 – 2011-04-25 (×2): 1 g via INTRAVENOUS

## 2011-04-24 MED ORDER — CEPHALEXIN 500 MG PO CAPS: 500.0000 mg | ORAL_CAPSULE | Freq: Three times a day (TID) | ORAL | Status: AC

## 2011-04-24 MED ORDER — PHENOL 1.4 % MT LIQD: 1.0000 | OROMUCOSAL | Status: DC | PRN

## 2011-04-24 MED ORDER — CEFAZOLIN SODIUM 1-5 GM-% IV SOLN
1.0000 g | Freq: Once | INTRAVENOUS | Status: AC
  Administered 2011-04-24: 1 g via INTRAVENOUS

## 2011-04-24 MED ORDER — ACETAMINOPHEN 650 MG RE SUPP: 650.0000 mg | Freq: Four times a day (QID) | RECTAL | Status: DC | PRN

## 2011-04-24 MED ORDER — FENTANYL CITRATE 0.05 MG/ML IJ SOLN
INTRAMUSCULAR | Status: DC | PRN
Start: 2011-04-24 — End: 2011-04-24
  Administered 2011-04-24: 50 ug via INTRAVENOUS
  Administered 2011-04-24: 25 ug via INTRAVENOUS
  Administered 2011-04-24: 50 ug via INTRAVENOUS

## 2011-04-24 MED ORDER — HYDROMORPHONE HCL 2 MG PO TABS: ORAL_TABLET | ORAL | Status: AC

## 2011-04-24 MED ORDER — MAGNESIUM HYDROXIDE 400 MG/5ML PO SUSP: 30.0000 mL | Freq: Two times a day (BID) | ORAL | Status: DC | PRN

## 2011-04-24 SURGICAL SUPPLY — 86 items
ANCH SUT SWLK 19.1 CLS EYLT VT (Anchor) ×2 IMPLANT
ANCH SUT SWLK 19.1X4.75 (Anchor) ×2 IMPLANT
ANCHOR BIO SWLOCK 4.75 W/TIG (Anchor) ×2 IMPLANT
ANCHOR SUT BIO SW 4.75X19.1 (Anchor) ×2 IMPLANT
BANDAGE ADHESIVE 1X3 (GAUZE/BANDAGES/DRESSINGS) IMPLANT
BLADE AVERAGE 25X9 (BLADE) IMPLANT
BLADE CUTTER MENIS 5.5 (BLADE) IMPLANT
BLADE SURG 15 STRL LF DISP TIS (BLADE) ×2 IMPLANT
BLADE SURG 15 STRL SS (BLADE) ×4
BLADE VORTEX 6.0 (BLADE) ×2 IMPLANT
BUR EGG/OVAL CARBIDE (BURR) ×1 IMPLANT
CANISTER OMNI JUG 16 LITER (MISCELLANEOUS) ×2 IMPLANT
CANISTER SUCTION 2500CC (MISCELLANEOUS) IMPLANT
CANNULA 5.75X7 CRYSTAL CLEAR (CANNULA) IMPLANT
CANNULA SHOULDER 7CM (CANNULA) IMPLANT
CANNULA TWIST IN 8.25X7CM (CANNULA) IMPLANT
CLEANER CAUTERY TIP 5X5 PAD (MISCELLANEOUS) IMPLANT
CLOTH BEACON ORANGE TIMEOUT ST (SAFETY) ×2 IMPLANT
CUTTER MENISCUS  4.2MM (BLADE) ×1
CUTTER MENISCUS 4.2MM (BLADE) ×1 IMPLANT
DECANTER SPIKE VIAL GLASS SM (MISCELLANEOUS) IMPLANT
DRAPE INCISE IOBAN 66X45 STRL (DRAPES) ×2 IMPLANT
DRAPE STERI 35X30 U-POUCH (DRAPES) ×2 IMPLANT
DRAPE SURG 17X23 STRL (DRAPES) ×2 IMPLANT
DRAPE U-SHAPE 47X51 STRL (DRAPES) ×2 IMPLANT
DRAPE U-SHAPE 76X120 STRL (DRAPES) ×4 IMPLANT
DRSG PAD ABDOMINAL 8X10 ST (GAUZE/BANDAGES/DRESSINGS) ×2 IMPLANT
DURAPREP 26ML APPLICATOR (WOUND CARE) ×2 IMPLANT
ELECT REM PT RETURN 9FT ADLT (ELECTROSURGICAL)
ELECTRODE REM PT RTRN 9FT ADLT (ELECTROSURGICAL) IMPLANT
GLOVE BIO SURGEON STRL SZ 6.5 (GLOVE) ×2 IMPLANT
GLOVE BIOGEL M STRL SZ7.5 (GLOVE) ×2 IMPLANT
GLOVE BIOGEL PI IND STRL 7.0 (GLOVE) IMPLANT
GLOVE BIOGEL PI IND STRL 8 (GLOVE) ×2 IMPLANT
GLOVE BIOGEL PI INDICATOR 7.0 (GLOVE) ×2
GLOVE BIOGEL PI INDICATOR 8 (GLOVE) ×2
GLOVE ORTHO TXT STRL SZ7.5 (GLOVE) ×2 IMPLANT
GOWN PREVENTION PLUS XLARGE (GOWN DISPOSABLE) ×3 IMPLANT
GOWN PREVENTION PLUS XXLARGE (GOWN DISPOSABLE) ×4 IMPLANT
NDL SCORPION (NEEDLE) ×1 IMPLANT
NDL SUT 6 .5 CRC .975X.05 MAYO (NEEDLE) IMPLANT
NEEDLE MAYO TAPER (NEEDLE)
NEEDLE MINI RC 24MM (NEEDLE) IMPLANT
NEEDLE SCORPION (NEEDLE) ×2 IMPLANT
PACK ARTHROSCOPY DSU (CUSTOM PROCEDURE TRAY) ×2 IMPLANT
PACK BASIN DAY SURGERY FS (CUSTOM PROCEDURE TRAY) ×2 IMPLANT
PAD CLEANER CAUTERY TIP 5X5 (MISCELLANEOUS) ×1
PASSER SUT SWANSON 36MM LOOP (INSTRUMENTS) IMPLANT
PENCIL BUTTON HOLSTER BLD 10FT (ELECTRODE) ×1 IMPLANT
RESECTOR FULL RADIUS 4.2MM (BLADE) IMPLANT
RESECTOR FULL RADIUS 4.8MM (BLADE) IMPLANT
SLEEVE SCD COMPRESS KNEE MED (MISCELLANEOUS) ×2 IMPLANT
SLING ARM FOAM STRAP LRG (SOFTGOODS) ×1 IMPLANT
SLING ARM FOAM STRAP MED (SOFTGOODS) IMPLANT
SPONGE GAUZE 4X4 12PLY (GAUZE/BANDAGES/DRESSINGS) ×2 IMPLANT
SPONGE LAP 4X18 X RAY DECT (DISPOSABLE) ×1 IMPLANT
STRIP CLOSURE SKIN 1/2X4 (GAUZE/BANDAGES/DRESSINGS) ×2 IMPLANT
SUCTION FRAZIER TIP 10 FR DISP (SUCTIONS) ×1 IMPLANT
SUT ETHIBOND 2 OS 4 DA (SUTURE) IMPLANT
SUT ETHILON 4 0 PS 2 18 (SUTURE) IMPLANT
SUT FIBERWIRE #2 38 T-5 BLUE (SUTURE)
SUT FIBERWIRE 3-0 18 TAPR NDL (SUTURE)
SUT PROLENE 1 CT (SUTURE) IMPLANT
SUT PROLENE 3 0 PS 2 (SUTURE) ×3 IMPLANT
SUT TIGER TAPE 7 IN WHITE (SUTURE) IMPLANT
SUT VIC AB 0 CT1 27 (SUTURE)
SUT VIC AB 0 CT1 27XBRD ANBCTR (SUTURE) IMPLANT
SUT VIC AB 0 SH 27 (SUTURE) IMPLANT
SUT VIC AB 2-0 SH 27 (SUTURE) ×2
SUT VIC AB 2-0 SH 27XBRD (SUTURE) IMPLANT
SUT VIC AB 3-0 SH 27 (SUTURE) ×2
SUT VIC AB 3-0 SH 27X BRD (SUTURE) IMPLANT
SUT VIC AB 3-0 X1 27 (SUTURE) IMPLANT
SUTURE FIBERWR #2 38 T-5 BLUE (SUTURE) IMPLANT
SUTURE FIBERWR 3-0 18 TAPR NDL (SUTURE) IMPLANT
SYR 3ML 23GX1 SAFETY (SYRINGE) IMPLANT
SYR BULB 3OZ (MISCELLANEOUS) IMPLANT
TAPE CLOTH SURG 6X10 WHT LF (GAUZE/BANDAGES/DRESSINGS) ×1 IMPLANT
TAPE FIBER 2MM 7IN #2 BLUE (SUTURE) IMPLANT
TAPE PAPER 3X10 WHT MICROPORE (GAUZE/BANDAGES/DRESSINGS) ×2 IMPLANT
TOWEL OR 17X24 6PK STRL BLUE (TOWEL DISPOSABLE) ×2 IMPLANT
TUBE CONNECTING 20X1/4 (TUBING) ×2 IMPLANT
TUBING ARTHROSCOPY IRRIG 16FT (MISCELLANEOUS) IMPLANT
WAND STAR VAC 90 (SURGICAL WAND) ×2 IMPLANT
WATER STERILE IRR 1000ML POUR (IV SOLUTION) ×2 IMPLANT
YANKAUER SUCT BULB TIP NO VENT (SUCTIONS) IMPLANT

## 2011-04-24 NOTE — Anesthesia Preprocedure Evaluation (Signed)
Anesthesia Evaluation  Patient identified by MRN, date of birth, ID band Patient awake    Reviewed: Allergy & Precautions, H&P , NPO status , Patient's Chart, lab work & pertinent test results  Airway Mallampati: II  Neck ROM: full    Dental   Pulmonary          Cardiovascular     Neuro/Psych    GI/Hepatic GERD-  ,  Endo/Other    Renal/GU      Musculoskeletal   Abdominal   Peds  Hematology   Anesthesia Other Findings   Reproductive/Obstetrics                           Anesthesia Physical Anesthesia Plan  ASA: II  Anesthesia Plan: General   Post-op Pain Management: MAC Combined w/ Regional for Post-op pain   Induction: Intravenous  Airway Management Planned: Oral ETT  Additional Equipment:   Intra-op Plan:   Post-operative Plan:   Informed Consent: I have reviewed the patients History and Physical, chart, labs and discussed the procedure including the risks, benefits and alternatives for the proposed anesthesia with the patient or authorized representative who has indicated his/her understanding and acceptance.     Plan Discussed with: CRNA and Surgeon  Anesthesia Plan Comments:         Anesthesia Quick Evaluation

## 2011-04-24 NOTE — Op Note (Signed)
Op note dictated 04/24/11  MRN:  [id number] CSN:  [id number]

## 2011-04-24 NOTE — Op Note (Signed)
Robert Adams, Robert Adams                ACCOUNT NO.:  [id number]  MEDICAL RECORD NO.:  [id number]  LOCATION:                                 FACILITY:  PHYSICIAN:  Katy Fitch. Khiry Pasquariello, M.D.      DATE OF BIRTH:  DATE OF PROCEDURE:  04/24/2011 DATE OF DISCHARGE:                              OPERATIVE REPORT   PREOPERATIVE DIAGNOSIS:  Chronic stage III impingement of right shoulder with probable rotator cuff tear. Robert Adams is status post arthroscopic subacromial decompression with open acromioplasty and repair of his rotator cuff performed in 1987. MRI imaging of his shoulder was impossible due to multiple retained metallic foreign bodies following his 1987 shoulder arthroscopy.  Also clinically, he had signs of adhesive capsulitis.  POSTOPERATIVE DIAGNOSES: 1. A 75% pasta lesion of supraspinatus and infraspinatus tendons. 2. Significant acromioclavicular degenerative arthritis with impinging     osteophytes of distal clavicle and medial acromion. 3. Severe subacromial bursitis with multiple metallic flake foreign     bodies from prior arthroscopy with severe scarring of bursa. 4. Significant synovitis due to metallic foreign bodies and chronic     adhesive capsulitis inflammation.  OPERATION: 1. Diagnostic arthroscopy of right glenohumeral joint. 2. Arthroscopic synovectomy, debridement of labrum, and capsulectomy. 3. Arthroscopic subacromial decompression with acromioplasty,     coracoacromial ligament relaxation, and bursectomy with removal of     extensive metallic foreign body impregnating entire subacromial     bursal space. 4. Arthroscopic distal clavicle resection. 5. Open hybrid reconstruction of rotator cuff with a speed bridge     technique, a medial suture bridge, and 2 medial mattress sutures     for anatomic footprint.  SURGEON:  Katy Fitch. Avien Taha, MD  ASSISTANT:  Marveen Reeks Dasnoit, PAC  ANESTHESIA:  General endotracheal, supplemented by a right plexus  block.  SUPERVISING ANESTHESIOLOGIST:  Robert Rich, MD  INDICATIONS:  Robert Adams is a 65 year old gentleman who is self- referred for evaluation and management of chronic shoulder pain.  He had a remote history of arthroscopic evaluation of his right shoulder by Dr. Debria Garret 25 years ago with arthroscopic subacromial decompression and repair of his cuff. Robert Adams had clinical examination findings compatible with adhesive capsulitis and a probable rotator cuff tear.  We sent him for plain films, it has documented very prominent distal clavicle and medial acromion.  He had reactive bone formation at the greater tuberosity suggesting a cuff tear.  We sent him for an MRI that was not technically possible due to his metal foreign bodies in the bursa.  Given this situation and given his pain and impairment, we decided that it was in his best interest to proceed with diagnostic arthroscopy and appropriate intervention.  He is brought to the operating room at this time with a goal of relieving his pain, improving his function, and correcting as much of his retained metal foreign material as possible.  He understood that we had a spectrum of choices he might be faced with, which include rehabilitation for rotator cuff repair, rehabilitation for subacromial decompression, bursectomy, and distal clavicle resection, and rehabilitation of adhesive capsulitis.  Preoperatively, questions were invited and answered in detail in  the office as well as holding area.  He was interviewed by Robert Adams for anesthesia informed consent and after detailed description of the procedure underwent a right plexus block by Robert Adams without complication in the holding area.  This led to very satisfactory anesthesia of the right upper extremity and forequarter.  PROCEDURE:  Robert Adams was brought to room 2 of the [facility] and placed in supine position on the operating table.   Following the induction of general endotracheal anesthesia under Dr. Seward Meth direct supervision, he was carefully positioned in the beach-chair position with aid of a torso and head holder designed for shoulder arthroscopy.  The entire right arm and forequarter were prepped with DuraPrep and draped with impervious arthroscopy drapes.  Ancef 2 g were administered as an IV prophylactic antibiotic.  Procedure commenced with routine surgical time-out.  We placed the arthroscope through a standard posterior viewing portal with anterior switching stick instrumentation.  Diagnostic arthroscopy revealed abundant scarring of the anterior capsule, significant synovitis and a moderate degree of labral degenerative changes.  The long head of the biceps had a stable origin at the superior labrum and was normal through the rotator interval.  The subscapularis was invested in scar that was debrided with a 4.2-mm suction shaver brought in anteriorly.  We performed an anterior capsulectomy with opening of the rotator interval.  The supraspinatus and infraspinatus tendons were directly inspected.  There was a significant pasta lesion with significant degenerative change in the tendon.  This was debrided to stable margin and left a footprint in more than 75% of the insertion of supraspinatus and infraspinatus off of the greater tuberosity.  The greater tuberosity was then decorticated with a suction shaver in anticipation of possible arthroscopic repair.  We subsequently removed the arthroscopic equipment from the glenohumeral joint, placed in subacromial space.  We found florid bursitis with an exceptional amount of metal flake from his prior arthroscopy.  This was the technical reason for inability to visualize his cuff on the MRI.  After bursectomy, the coracoacromial ligament was released with cutting cautery followed by leveling of the acromion to a type 1 morphology. The capsule of the [facility]  joint was taken down and documentation of the osteophyte was accomplished with a digital camera before resection.  The distal cm of clavicle was resected, and hemostasis was achieved with bipolar cautery.  After completion of bursectomy, we started an arthroscopic speed bridge repair; however, due to challenges with the scarring of the bursa and getting a proper angle for insertion of our medial row of swivel locks, I ultimately decided to re-enter his prior surgical incision, split the deltoid, complete further tendon debridement, and complete the pasta lesion to a complete tear.  We then decorticated the greater tuberosity using a power bur to lower the profile of the tuberosity 3 mm removing the reactive osteophytes.  After debridement of the tendon, we proceeded to perform a speed bridge technique with a medial suture bridge followed by anatomic inset of the cuff to a decorticated footprint.  Care was taken to identify and protect the long head of biceps throughout the procedure.  An excellent repair was achieved.  The bursa was then irrigated.  A hand rasp was used to bevel the lateral edge of the acromion.  The deltoid split was then repaired with mattress suture of 0 Vicryl figure-of-eight style x3, followed by repair of the skin with subcutaneous 0 Vicryl, 2-0 Vicryl, and intradermal 3-0 Prolene.  Robert Adams was  placed in a sling with general anesthesia and transferred to recovery room with stable signs.  He will be admitted to recovery care center for observation of his vital signs and prophylactic antibiotics in the form of Ancef 1 g IV q.6 h. x3 doses.     Katy Fitch Nikhil Osei, M.D.     RVS/MEDQ  D:  04/24/2011  T:  04/24/2011  Job:  [id number]  cc:   Juline Patch, M.D.

## 2011-04-24 NOTE — Anesthesia Procedure Notes (Signed)
Anesthesia Regional Block:  Interscalene brachial plexus block  Pre-Anesthetic Checklist: ,, timeout performed, Correct Patient, Correct Site, Correct Laterality, Correct Procedure, Correct Position, site marked, Risks and benefits discussed,  Surgical consent,  Pre-op evaluation,  At surgeon's request and post-op pain management  Laterality: Right  Prep: chloraprep       Needles:  Injection technique: Single-shot  Needle Type: Echogenic Stimulator Needle     Needle Length: 5cm 5 cm Needle Gauge: 22 and 22 G    Additional Needles:  Procedures: ultrasound guided and nerve stimulator Interscalene brachial plexus block  Nerve Stimulator or Paresthesia:  Response: biceps flexion, 0.45 mA,   Additional Responses:   Narrative:  Start time: 04/24/2011 1:20 PM End time: 04/24/2011 1:33 PM Injection made incrementally with aspirations every 5 mL.  Performed by: Personally  Anesthesiologist: Dr Chaney Malling  Additional Notes: Functioning IV was confirmed and monitors were applied.  A 50mm 22ga Arrow echogenic stimulator needle was used. Sterile prep and drape,hand hygiene and sterile gloves were used.  Negative aspiration and negative test dose prior to incremental administration of local anesthetic. The patient tolerated the procedure well.  Ultrasound guidance: relevent anatomy identified, needle position confirmed, local anesthetic spread visualized around nerve(s), vascular puncture avoided.  Image printed for medical record.   Interscalene brachial plexus block

## 2011-04-24 NOTE — Transfer of Care (Signed)
Immediate Anesthesia Transfer of Care Note  Patient: Robert Adams  Procedure(s) Performed:  SHOULDER ARTHROSCOPY WITH ROTATOR CUFF REPAIR - Arthroscopy right shoulder, sad, dcr,  with open rotator cuff repair  Patient Location: PACU  Anesthesia Type: General and Regional  Level of Consciousness: awake, alert  and oriented  Airway & Oxygen Therapy: Patient Spontanous Breathing and Patient connected to face mask oxygen  Post-op Assessment: Report given to PACU RN  Post vital signs: stable  Complications: No apparent anesthesia complications

## 2011-04-24 NOTE — Brief Op Note (Signed)
04/24/2011  3:46 PM  PATIENT:  Llana Aliment  65 y.o. male  PRE-OPERATIVE DIAGNOSIS:  stage 3 impingement right / shoulder AC degenerative athritis 75% PASTA tear supra and infra spinatus, frozen shoulder  POST-OPERATIVE DIAGNOSIS:  right shoulder impingement with rotator cuff tear( 75% PASTA), adhesive capsulitis,  PROCEDURE:  Procedure(s): SHOULDER ARTHROSCOPY WITH DEBRIDEMENT OF ADHESIVE CAPSULITIS CONTRACTURE OPEN HYBRID ROTATOR CUFF REPAIR 75% PASTA, SUBACROMIAL DECOMPRESSION AND SCOPE DISTAL CLAVICLE RESECTION  SURGEON:  Surgeon(s): Wyn Forster., MD  PHYSICIAN ASSISTANT:   ASSISTANTS: Annye Rusk, P.A.-C  ANESTHESIA:   general  EBL:  Total I/O In: 1000 [I.V.:1000] Out: -   BLOOD ADMINISTERED:none  DRAINS: none   LOCAL MEDICATIONS USED:  NONE  SPECIMEN:  No Specimen  DISPOSITION OF SPECIMEN:  N/A  COUNTS:  YES  TOURNIQUET:  * No tourniquets in log *  DICTATION: .Other Dictation: Dictation Number [id number]  PLAN OF CARE: Admit for overnight observation  PATIENT DISPOSITION:  PACU - hemodynamically stable.

## 2011-04-24 NOTE — Anesthesia Postprocedure Evaluation (Signed)
Anesthesia Post Note  Patient: Robert Adams  Procedure(s) Performed:  SHOULDER ARTHROSCOPY WITH ROTATOR CUFF REPAIR - Arthroscopy right shoulder, sad, dcr,  with open rotator cuff repair  Anesthesia type: MAC  Patient location: PACU  Post pain: Pain level controlled  Post assessment: Patient's Cardiovascular Status Stable  Last Vitals:  Filed Vitals:   04/24/11 1700  BP: 140/58  Pulse: 76  Temp:   Resp: 25    Post vital signs: Reviewed and stable  Level of consciousness: sedated  Complications: No apparent anesthesia complications

## 2011-04-24 NOTE — H&P (Signed)
  H&P documentation: 04/24/11  -History and Physical Reviewed  -Patient has been re-examined  -No change in the plan of care  Sayan Aldava JR,Doyl Bitting V  

## 2011-04-24 NOTE — Progress Notes (Signed)
Assisted Dr. Chaney Malling with right, ultrasound guided, supraclavicular block. Side rails up, monitors on throughout procedure. See vital signs in flow sheet. Tolerated Procedure well. Wife in

## 2011-05-01 NOTE — Addendum Note (Signed)
Addendum  created 05/01/11 0856 by [place] Radley Teston, CRNA   Modules edited:Anesthesia Responsible Staff    

## 2011-05-01 NOTE — Addendum Note (Signed)
Addendum  created 05/01/11 0856 by Lance Coon, CRNA   Modules edited:Anesthesia Responsible Staff

## 2011-05-31 ENCOUNTER — Other Ambulatory Visit: Payer: Self-pay | Admitting: Interventional Radiology

## 2011-05-31 DIAGNOSIS — I83819 Varicose veins of unspecified lower extremities with pain: Secondary | ICD-10-CM

## 2011-06-26 ENCOUNTER — Ambulatory Visit
Admission: RE | Admit: 2011-06-26 | Discharge: 2011-06-26 | Disposition: A | Discharge status: Home / Self Care | Payer: Medicare Other | Source: Ambulatory Visit | Attending: Interventional Radiology | Admitting: Interventional Radiology

## 2011-06-26 DIAGNOSIS — I83819 Varicose veins of unspecified lower extremities with pain: Secondary | ICD-10-CM

## 2011-06-26 NOTE — Progress Notes (Signed)
Patient states the past three months has "not been good" wearing the compression stockings.  The thigh-high ones were too big (he had the smallest size).  "Absolutely no change" in symptoms.  Pain is unchanged behind left knee.  jkl

## 2011-07-31 ENCOUNTER — Other Ambulatory Visit: Payer: Self-pay | Admitting: Interventional Radiology

## 2011-07-31 DIAGNOSIS — I83812 Varicose veins of left lower extremities with pain: Secondary | ICD-10-CM

## 2011-08-01 ENCOUNTER — Other Ambulatory Visit: Payer: Self-pay | Admitting: Interventional Radiology

## 2011-08-01 DIAGNOSIS — I83812 Varicose veins of left lower extremities with pain: Secondary | ICD-10-CM

## 2011-08-14 ENCOUNTER — Other Ambulatory Visit: Payer: Medicare Other

## 2011-08-14 ENCOUNTER — Ambulatory Visit
Admission: RE | Admit: 2011-08-14 | Discharge: 2011-08-14 | Disposition: A | Discharge status: Home / Self Care | Payer: Medicare Other | Source: Ambulatory Visit | Attending: Interventional Radiology | Admitting: Interventional Radiology

## 2011-08-14 DIAGNOSIS — I83812 Varicose veins of left lower extremities with pain: Secondary | ICD-10-CM

## 2011-08-16 ENCOUNTER — Other Ambulatory Visit: Payer: Medicare Other

## 2011-08-16 ENCOUNTER — Ambulatory Visit: Payer: Medicare Other

## 2011-08-21 ENCOUNTER — Other Ambulatory Visit: Payer: Medicare Other

## 2011-09-11 ENCOUNTER — Ambulatory Visit
Admission: RE | Admit: 2011-09-11 | Discharge: 2011-09-11 | Disposition: A | Discharge status: Home / Self Care | Payer: Medicare Other | Source: Ambulatory Visit | Attending: Interventional Radiology | Admitting: Interventional Radiology

## 2011-09-11 DIAGNOSIS — I83812 Varicose veins of left lower extremities with pain: Secondary | ICD-10-CM

## 2011-09-11 NOTE — Progress Notes (Signed)
Pt reports that he wore thigh high graduated compression garment on LLE x 2 wks post sclerotherapy of LLE.  States that he continues to experience discomfort in affected areas, especially while sitting.  Has not been taking prn OTC pain meds for discomfort.

## 2014-06-11 ENCOUNTER — Encounter (INDEPENDENT_AMBULATORY_CARE_PROVIDER_SITE_OTHER): Payer: Self-pay | Admitting: Ophthalmology

## 2014-06-15 ENCOUNTER — Encounter (INDEPENDENT_AMBULATORY_CARE_PROVIDER_SITE_OTHER): Payer: Medicare Other | Admitting: Ophthalmology

## 2014-06-15 DIAGNOSIS — H3531 Nonexudative age-related macular degeneration: Secondary | ICD-10-CM | POA: Diagnosis not present

## 2014-06-15 DIAGNOSIS — H2513 Age-related nuclear cataract, bilateral: Secondary | ICD-10-CM

## 2014-06-15 DIAGNOSIS — H43813 Vitreous degeneration, bilateral: Secondary | ICD-10-CM

## 2014-06-19 DIAGNOSIS — H18413 Arcus senilis, bilateral: Secondary | ICD-10-CM | POA: Diagnosis not present

## 2014-06-19 DIAGNOSIS — H5203 Hypermetropia, bilateral: Secondary | ICD-10-CM | POA: Diagnosis not present

## 2014-06-19 DIAGNOSIS — H52223 Regular astigmatism, bilateral: Secondary | ICD-10-CM | POA: Diagnosis not present

## 2014-06-19 DIAGNOSIS — H524 Presbyopia: Secondary | ICD-10-CM | POA: Diagnosis not present

## 2014-06-19 DIAGNOSIS — H40013 Open angle with borderline findings, low risk, bilateral: Secondary | ICD-10-CM | POA: Diagnosis not present

## 2014-06-19 DIAGNOSIS — H11153 Pinguecula, bilateral: Secondary | ICD-10-CM | POA: Diagnosis not present

## 2014-06-19 DIAGNOSIS — H2513 Age-related nuclear cataract, bilateral: Secondary | ICD-10-CM | POA: Diagnosis not present

## 2014-06-19 DIAGNOSIS — H43813 Vitreous degeneration, bilateral: Secondary | ICD-10-CM | POA: Diagnosis not present

## 2014-06-19 DIAGNOSIS — H25013 Cortical age-related cataract, bilateral: Secondary | ICD-10-CM | POA: Diagnosis not present

## 2014-06-21 ENCOUNTER — Encounter (INDEPENDENT_AMBULATORY_CARE_PROVIDER_SITE_OTHER): Payer: Self-pay | Admitting: Ophthalmology

## 2014-07-14 DIAGNOSIS — M545 Low back pain: Secondary | ICD-10-CM | POA: Diagnosis not present

## 2014-07-14 DIAGNOSIS — M47816 Spondylosis without myelopathy or radiculopathy, lumbar region: Secondary | ICD-10-CM | POA: Diagnosis not present

## 2014-07-14 DIAGNOSIS — R300 Dysuria: Secondary | ICD-10-CM | POA: Diagnosis not present

## 2014-10-25 DIAGNOSIS — R0602 Shortness of breath: Secondary | ICD-10-CM | POA: Diagnosis not present

## 2014-10-25 DIAGNOSIS — Z125 Encounter for screening for malignant neoplasm of prostate: Secondary | ICD-10-CM | POA: Diagnosis not present

## 2014-10-25 DIAGNOSIS — L729 Follicular cyst of the skin and subcutaneous tissue, unspecified: Secondary | ICD-10-CM | POA: Diagnosis not present

## 2014-10-25 DIAGNOSIS — R5383 Other fatigue: Secondary | ICD-10-CM | POA: Diagnosis not present

## 2014-12-03 DIAGNOSIS — M1712 Unilateral primary osteoarthritis, left knee: Secondary | ICD-10-CM | POA: Diagnosis not present

## 2014-12-22 DIAGNOSIS — M5417 Radiculopathy, lumbosacral region: Secondary | ICD-10-CM | POA: Diagnosis not present

## 2014-12-22 DIAGNOSIS — M5136 Other intervertebral disc degeneration, lumbar region: Secondary | ICD-10-CM | POA: Diagnosis not present

## 2014-12-22 DIAGNOSIS — M1712 Unilateral primary osteoarthritis, left knee: Secondary | ICD-10-CM | POA: Diagnosis not present

## 2014-12-22 DIAGNOSIS — M179 Osteoarthritis of knee, unspecified: Secondary | ICD-10-CM | POA: Diagnosis not present

## 2014-12-22 DIAGNOSIS — M545 Low back pain: Secondary | ICD-10-CM | POA: Diagnosis not present

## 2014-12-22 DIAGNOSIS — M791 Myalgia: Secondary | ICD-10-CM | POA: Diagnosis not present

## 2014-12-22 DIAGNOSIS — M542 Cervicalgia: Secondary | ICD-10-CM | POA: Diagnosis not present

## 2014-12-22 DIAGNOSIS — M25551 Pain in right hip: Secondary | ICD-10-CM | POA: Diagnosis not present

## 2014-12-22 DIAGNOSIS — M5023 Other cervical disc displacement, cervicothoracic region: Secondary | ICD-10-CM | POA: Diagnosis not present

## 2014-12-22 DIAGNOSIS — M461 Sacroiliitis, not elsewhere classified: Secondary | ICD-10-CM | POA: Diagnosis not present

## 2014-12-22 DIAGNOSIS — M25562 Pain in left knee: Secondary | ICD-10-CM | POA: Diagnosis not present

## 2014-12-24 DIAGNOSIS — M25551 Pain in right hip: Secondary | ICD-10-CM | POA: Diagnosis not present

## 2014-12-24 DIAGNOSIS — M5136 Other intervertebral disc degeneration, lumbar region: Secondary | ICD-10-CM | POA: Diagnosis not present

## 2014-12-24 DIAGNOSIS — M545 Low back pain: Secondary | ICD-10-CM | POA: Diagnosis not present

## 2014-12-24 DIAGNOSIS — M5417 Radiculopathy, lumbosacral region: Secondary | ICD-10-CM | POA: Diagnosis not present

## 2014-12-24 DIAGNOSIS — M5023 Other cervical disc displacement, cervicothoracic region: Secondary | ICD-10-CM | POA: Diagnosis not present

## 2014-12-24 DIAGNOSIS — M461 Sacroiliitis, not elsewhere classified: Secondary | ICD-10-CM | POA: Diagnosis not present

## 2014-12-24 DIAGNOSIS — M791 Myalgia: Secondary | ICD-10-CM | POA: Diagnosis not present

## 2014-12-24 DIAGNOSIS — M542 Cervicalgia: Secondary | ICD-10-CM | POA: Diagnosis not present

## 2014-12-24 DIAGNOSIS — M25562 Pain in left knee: Secondary | ICD-10-CM | POA: Diagnosis not present

## 2014-12-24 DIAGNOSIS — M1712 Unilateral primary osteoarthritis, left knee: Secondary | ICD-10-CM | POA: Diagnosis not present

## 2014-12-24 DIAGNOSIS — M179 Osteoarthritis of knee, unspecified: Secondary | ICD-10-CM | POA: Diagnosis not present

## 2014-12-29 DIAGNOSIS — M5023 Other cervical disc displacement, cervicothoracic region: Secondary | ICD-10-CM | POA: Diagnosis not present

## 2014-12-29 DIAGNOSIS — M461 Sacroiliitis, not elsewhere classified: Secondary | ICD-10-CM | POA: Diagnosis not present

## 2014-12-29 DIAGNOSIS — M179 Osteoarthritis of knee, unspecified: Secondary | ICD-10-CM | POA: Diagnosis not present

## 2014-12-29 DIAGNOSIS — M25551 Pain in right hip: Secondary | ICD-10-CM | POA: Diagnosis not present

## 2014-12-29 DIAGNOSIS — M5136 Other intervertebral disc degeneration, lumbar region: Secondary | ICD-10-CM | POA: Diagnosis not present

## 2014-12-29 DIAGNOSIS — M545 Low back pain: Secondary | ICD-10-CM | POA: Diagnosis not present

## 2014-12-29 DIAGNOSIS — M791 Myalgia: Secondary | ICD-10-CM | POA: Diagnosis not present

## 2014-12-29 DIAGNOSIS — M542 Cervicalgia: Secondary | ICD-10-CM | POA: Diagnosis not present

## 2014-12-29 DIAGNOSIS — M5417 Radiculopathy, lumbosacral region: Secondary | ICD-10-CM | POA: Diagnosis not present

## 2014-12-29 DIAGNOSIS — M25562 Pain in left knee: Secondary | ICD-10-CM | POA: Diagnosis not present

## 2014-12-29 DIAGNOSIS — M1712 Unilateral primary osteoarthritis, left knee: Secondary | ICD-10-CM | POA: Diagnosis not present

## 2014-12-31 DIAGNOSIS — M461 Sacroiliitis, not elsewhere classified: Secondary | ICD-10-CM | POA: Diagnosis not present

## 2014-12-31 DIAGNOSIS — M545 Low back pain: Secondary | ICD-10-CM | POA: Diagnosis not present

## 2014-12-31 DIAGNOSIS — M791 Myalgia: Secondary | ICD-10-CM | POA: Diagnosis not present

## 2014-12-31 DIAGNOSIS — M25551 Pain in right hip: Secondary | ICD-10-CM | POA: Diagnosis not present

## 2014-12-31 DIAGNOSIS — M1712 Unilateral primary osteoarthritis, left knee: Secondary | ICD-10-CM | POA: Diagnosis not present

## 2014-12-31 DIAGNOSIS — M5136 Other intervertebral disc degeneration, lumbar region: Secondary | ICD-10-CM | POA: Diagnosis not present

## 2014-12-31 DIAGNOSIS — M179 Osteoarthritis of knee, unspecified: Secondary | ICD-10-CM | POA: Diagnosis not present

## 2014-12-31 DIAGNOSIS — M25562 Pain in left knee: Secondary | ICD-10-CM | POA: Diagnosis not present

## 2014-12-31 DIAGNOSIS — M5417 Radiculopathy, lumbosacral region: Secondary | ICD-10-CM | POA: Diagnosis not present

## 2014-12-31 DIAGNOSIS — M542 Cervicalgia: Secondary | ICD-10-CM | POA: Diagnosis not present

## 2014-12-31 DIAGNOSIS — M5023 Other cervical disc displacement, cervicothoracic region: Secondary | ICD-10-CM | POA: Diagnosis not present

## 2015-01-05 DIAGNOSIS — C44629 Squamous cell carcinoma of skin of left upper limb, including shoulder: Secondary | ICD-10-CM | POA: Diagnosis not present

## 2015-01-07 DIAGNOSIS — M25551 Pain in right hip: Secondary | ICD-10-CM | POA: Diagnosis not present

## 2015-01-07 DIAGNOSIS — M25562 Pain in left knee: Secondary | ICD-10-CM | POA: Diagnosis not present

## 2015-01-07 DIAGNOSIS — M542 Cervicalgia: Secondary | ICD-10-CM | POA: Diagnosis not present

## 2015-01-07 DIAGNOSIS — M5136 Other intervertebral disc degeneration, lumbar region: Secondary | ICD-10-CM | POA: Diagnosis not present

## 2015-01-07 DIAGNOSIS — M179 Osteoarthritis of knee, unspecified: Secondary | ICD-10-CM | POA: Diagnosis not present

## 2015-01-07 DIAGNOSIS — M545 Low back pain: Secondary | ICD-10-CM | POA: Diagnosis not present

## 2015-01-07 DIAGNOSIS — M5023 Other cervical disc displacement, cervicothoracic region: Secondary | ICD-10-CM | POA: Diagnosis not present

## 2015-01-07 DIAGNOSIS — M1712 Unilateral primary osteoarthritis, left knee: Secondary | ICD-10-CM | POA: Diagnosis not present

## 2015-01-07 DIAGNOSIS — M791 Myalgia: Secondary | ICD-10-CM | POA: Diagnosis not present

## 2015-01-07 DIAGNOSIS — M5417 Radiculopathy, lumbosacral region: Secondary | ICD-10-CM | POA: Diagnosis not present

## 2015-01-07 DIAGNOSIS — M461 Sacroiliitis, not elsewhere classified: Secondary | ICD-10-CM | POA: Diagnosis not present

## 2015-01-14 DIAGNOSIS — M5417 Radiculopathy, lumbosacral region: Secondary | ICD-10-CM | POA: Diagnosis not present

## 2015-01-14 DIAGNOSIS — M179 Osteoarthritis of knee, unspecified: Secondary | ICD-10-CM | POA: Diagnosis not present

## 2015-01-14 DIAGNOSIS — M25551 Pain in right hip: Secondary | ICD-10-CM | POA: Diagnosis not present

## 2015-01-14 DIAGNOSIS — M791 Myalgia: Secondary | ICD-10-CM | POA: Diagnosis not present

## 2015-01-14 DIAGNOSIS — M5136 Other intervertebral disc degeneration, lumbar region: Secondary | ICD-10-CM | POA: Diagnosis not present

## 2015-01-14 DIAGNOSIS — M1712 Unilateral primary osteoarthritis, left knee: Secondary | ICD-10-CM | POA: Diagnosis not present

## 2015-01-14 DIAGNOSIS — M25562 Pain in left knee: Secondary | ICD-10-CM | POA: Diagnosis not present

## 2015-01-14 DIAGNOSIS — M461 Sacroiliitis, not elsewhere classified: Secondary | ICD-10-CM | POA: Diagnosis not present

## 2015-01-14 DIAGNOSIS — M5023 Other cervical disc displacement, cervicothoracic region: Secondary | ICD-10-CM | POA: Diagnosis not present

## 2015-01-14 DIAGNOSIS — M542 Cervicalgia: Secondary | ICD-10-CM | POA: Diagnosis not present

## 2015-01-14 DIAGNOSIS — M545 Low back pain: Secondary | ICD-10-CM | POA: Diagnosis not present

## 2015-01-20 DIAGNOSIS — M461 Sacroiliitis, not elsewhere classified: Secondary | ICD-10-CM | POA: Diagnosis not present

## 2015-01-20 DIAGNOSIS — M5136 Other intervertebral disc degeneration, lumbar region: Secondary | ICD-10-CM | POA: Diagnosis not present

## 2015-01-20 DIAGNOSIS — M25562 Pain in left knee: Secondary | ICD-10-CM | POA: Diagnosis not present

## 2015-01-20 DIAGNOSIS — M179 Osteoarthritis of knee, unspecified: Secondary | ICD-10-CM | POA: Diagnosis not present

## 2015-01-20 DIAGNOSIS — M1712 Unilateral primary osteoarthritis, left knee: Secondary | ICD-10-CM | POA: Diagnosis not present

## 2015-01-20 DIAGNOSIS — M545 Low back pain: Secondary | ICD-10-CM | POA: Diagnosis not present

## 2015-01-20 DIAGNOSIS — M5023 Other cervical disc displacement, cervicothoracic region: Secondary | ICD-10-CM | POA: Diagnosis not present

## 2015-01-20 DIAGNOSIS — M791 Myalgia: Secondary | ICD-10-CM | POA: Diagnosis not present

## 2015-01-20 DIAGNOSIS — M25551 Pain in right hip: Secondary | ICD-10-CM | POA: Diagnosis not present

## 2015-01-20 DIAGNOSIS — M5417 Radiculopathy, lumbosacral region: Secondary | ICD-10-CM | POA: Diagnosis not present

## 2015-01-20 DIAGNOSIS — M542 Cervicalgia: Secondary | ICD-10-CM | POA: Diagnosis not present

## 2015-01-28 DIAGNOSIS — M5023 Other cervical disc displacement, cervicothoracic region: Secondary | ICD-10-CM | POA: Diagnosis not present

## 2015-01-28 DIAGNOSIS — M461 Sacroiliitis, not elsewhere classified: Secondary | ICD-10-CM | POA: Diagnosis not present

## 2015-01-28 DIAGNOSIS — M5417 Radiculopathy, lumbosacral region: Secondary | ICD-10-CM | POA: Diagnosis not present

## 2015-01-28 DIAGNOSIS — M545 Low back pain: Secondary | ICD-10-CM | POA: Diagnosis not present

## 2015-01-28 DIAGNOSIS — M542 Cervicalgia: Secondary | ICD-10-CM | POA: Diagnosis not present

## 2015-01-28 DIAGNOSIS — M1712 Unilateral primary osteoarthritis, left knee: Secondary | ICD-10-CM | POA: Diagnosis not present

## 2015-01-28 DIAGNOSIS — M25562 Pain in left knee: Secondary | ICD-10-CM | POA: Diagnosis not present

## 2015-01-28 DIAGNOSIS — M25551 Pain in right hip: Secondary | ICD-10-CM | POA: Diagnosis not present

## 2015-01-28 DIAGNOSIS — M5136 Other intervertebral disc degeneration, lumbar region: Secondary | ICD-10-CM | POA: Diagnosis not present

## 2015-01-28 DIAGNOSIS — M179 Osteoarthritis of knee, unspecified: Secondary | ICD-10-CM | POA: Diagnosis not present

## 2015-01-28 DIAGNOSIS — M791 Myalgia: Secondary | ICD-10-CM | POA: Diagnosis not present

## 2015-02-02 DIAGNOSIS — Z08 Encounter for follow-up examination after completed treatment for malignant neoplasm: Secondary | ICD-10-CM | POA: Diagnosis not present

## 2015-02-02 DIAGNOSIS — Z85828 Personal history of other malignant neoplasm of skin: Secondary | ICD-10-CM | POA: Diagnosis not present

## 2015-02-11 DIAGNOSIS — M79605 Pain in left leg: Secondary | ICD-10-CM | POA: Diagnosis not present

## 2015-02-28 DIAGNOSIS — E780 Pure hypercholesterolemia: Secondary | ICD-10-CM | POA: Diagnosis not present

## 2015-02-28 DIAGNOSIS — R5383 Other fatigue: Secondary | ICD-10-CM | POA: Diagnosis not present

## 2015-02-28 DIAGNOSIS — R0602 Shortness of breath: Secondary | ICD-10-CM | POA: Diagnosis not present

## 2015-02-28 DIAGNOSIS — Z125 Encounter for screening for malignant neoplasm of prostate: Secondary | ICD-10-CM | POA: Diagnosis not present

## 2015-03-04 DIAGNOSIS — M150 Primary generalized (osteo)arthritis: Secondary | ICD-10-CM | POA: Diagnosis not present

## 2015-03-04 DIAGNOSIS — N401 Enlarged prostate with lower urinary tract symptoms: Secondary | ICD-10-CM | POA: Diagnosis not present

## 2015-03-04 DIAGNOSIS — F411 Generalized anxiety disorder: Secondary | ICD-10-CM | POA: Diagnosis not present

## 2015-03-04 DIAGNOSIS — Q619 Cystic kidney disease, unspecified: Secondary | ICD-10-CM | POA: Diagnosis not present

## 2015-03-04 DIAGNOSIS — Z23 Encounter for immunization: Secondary | ICD-10-CM | POA: Diagnosis not present

## 2015-08-05 DIAGNOSIS — N3090 Cystitis, unspecified without hematuria: Secondary | ICD-10-CM | POA: Diagnosis not present

## 2015-08-08 DIAGNOSIS — H2513 Age-related nuclear cataract, bilateral: Secondary | ICD-10-CM | POA: Diagnosis not present

## 2015-08-08 DIAGNOSIS — H04123 Dry eye syndrome of bilateral lacrimal glands: Secondary | ICD-10-CM | POA: Diagnosis not present

## 2015-08-08 DIAGNOSIS — H353131 Nonexudative age-related macular degeneration, bilateral, early dry stage: Secondary | ICD-10-CM | POA: Diagnosis not present

## 2015-08-08 DIAGNOSIS — H11153 Pinguecula, bilateral: Secondary | ICD-10-CM | POA: Diagnosis not present

## 2015-08-08 DIAGNOSIS — H40013 Open angle with borderline findings, low risk, bilateral: Secondary | ICD-10-CM | POA: Diagnosis not present

## 2015-08-08 DIAGNOSIS — H18413 Arcus senilis, bilateral: Secondary | ICD-10-CM | POA: Diagnosis not present

## 2015-08-08 DIAGNOSIS — H11423 Conjunctival edema, bilateral: Secondary | ICD-10-CM | POA: Diagnosis not present

## 2015-08-08 DIAGNOSIS — H3589 Other specified retinal disorders: Secondary | ICD-10-CM | POA: Diagnosis not present

## 2015-08-19 DIAGNOSIS — Z0000 Encounter for general adult medical examination without abnormal findings: Secondary | ICD-10-CM | POA: Diagnosis not present

## 2015-08-19 DIAGNOSIS — R102 Pelvic and perineal pain: Secondary | ICD-10-CM | POA: Diagnosis not present

## 2015-08-19 DIAGNOSIS — M545 Low back pain: Secondary | ICD-10-CM | POA: Diagnosis not present

## 2015-09-14 DIAGNOSIS — M1712 Unilateral primary osteoarthritis, left knee: Secondary | ICD-10-CM | POA: Diagnosis not present

## 2015-11-14 DIAGNOSIS — E7800 Pure hypercholesterolemia, unspecified: Secondary | ICD-10-CM | POA: Diagnosis not present

## 2015-11-14 DIAGNOSIS — Z0000 Encounter for general adult medical examination without abnormal findings: Secondary | ICD-10-CM | POA: Diagnosis not present

## 2015-11-14 DIAGNOSIS — G4700 Insomnia, unspecified: Secondary | ICD-10-CM | POA: Diagnosis not present

## 2015-11-25 DIAGNOSIS — M1712 Unilateral primary osteoarthritis, left knee: Secondary | ICD-10-CM | POA: Diagnosis not present

## 2015-12-14 DIAGNOSIS — F5101 Primary insomnia: Secondary | ICD-10-CM | POA: Diagnosis not present

## 2015-12-14 DIAGNOSIS — Z125 Encounter for screening for malignant neoplasm of prostate: Secondary | ICD-10-CM | POA: Diagnosis not present

## 2015-12-14 DIAGNOSIS — N401 Enlarged prostate with lower urinary tract symptoms: Secondary | ICD-10-CM | POA: Diagnosis not present

## 2015-12-14 DIAGNOSIS — Z0000 Encounter for general adult medical examination without abnormal findings: Secondary | ICD-10-CM | POA: Diagnosis not present

## 2016-04-04 DIAGNOSIS — R05 Cough: Secondary | ICD-10-CM | POA: Diagnosis not present

## 2016-05-14 DIAGNOSIS — H2513 Age-related nuclear cataract, bilateral: Secondary | ICD-10-CM | POA: Diagnosis not present

## 2016-05-14 DIAGNOSIS — H5213 Myopia, bilateral: Secondary | ICD-10-CM | POA: Diagnosis not present

## 2016-05-14 DIAGNOSIS — H524 Presbyopia: Secondary | ICD-10-CM | POA: Diagnosis not present

## 2016-05-14 DIAGNOSIS — H40013 Open angle with borderline findings, low risk, bilateral: Secondary | ICD-10-CM | POA: Diagnosis not present

## 2016-05-14 DIAGNOSIS — H52203 Unspecified astigmatism, bilateral: Secondary | ICD-10-CM | POA: Diagnosis not present

## 2016-05-14 DIAGNOSIS — H43391 Other vitreous opacities, right eye: Secondary | ICD-10-CM | POA: Diagnosis not present

## 2016-05-21 ENCOUNTER — Ambulatory Visit: Payer: 59 | Admitting: Family Medicine

## 2016-05-21 DIAGNOSIS — N400 Enlarged prostate without lower urinary tract symptoms: Secondary | ICD-10-CM | POA: Diagnosis not present

## 2016-05-21 DIAGNOSIS — F411 Generalized anxiety disorder: Secondary | ICD-10-CM | POA: Diagnosis not present

## 2016-05-21 DIAGNOSIS — Z23 Encounter for immunization: Secondary | ICD-10-CM | POA: Diagnosis not present

## 2016-05-21 DIAGNOSIS — E784 Other hyperlipidemia: Secondary | ICD-10-CM | POA: Diagnosis not present

## 2016-05-21 DIAGNOSIS — R7301 Impaired fasting glucose: Secondary | ICD-10-CM | POA: Diagnosis not present

## 2016-05-21 DIAGNOSIS — Z0000 Encounter for general adult medical examination without abnormal findings: Secondary | ICD-10-CM | POA: Diagnosis not present

## 2016-06-11 DIAGNOSIS — J069 Acute upper respiratory infection, unspecified: Secondary | ICD-10-CM | POA: Diagnosis not present

## 2016-06-11 DIAGNOSIS — R0600 Dyspnea, unspecified: Secondary | ICD-10-CM | POA: Diagnosis not present

## 2016-06-11 DIAGNOSIS — R0602 Shortness of breath: Secondary | ICD-10-CM | POA: Diagnosis not present

## 2016-06-24 ENCOUNTER — Emergency Department (HOSPITAL_BASED_OUTPATIENT_CLINIC_OR_DEPARTMENT_OTHER): Payer: Medicare Other

## 2016-06-24 ENCOUNTER — Emergency Department (HOSPITAL_BASED_OUTPATIENT_CLINIC_OR_DEPARTMENT_OTHER)
Admission: EM | Admit: 2016-06-24 | Discharge: 2016-06-24 | Disposition: A | Discharge status: Home / Self Care | Payer: Medicare Other | Attending: Emergency Medicine | Admitting: Emergency Medicine

## 2016-06-24 ENCOUNTER — Encounter (HOSPITAL_BASED_OUTPATIENT_CLINIC_OR_DEPARTMENT_OTHER): Payer: Self-pay | Admitting: *Deleted

## 2016-06-24 DIAGNOSIS — S0990XA Unspecified injury of head, initial encounter: Secondary | ICD-10-CM

## 2016-06-24 DIAGNOSIS — Y999 Unspecified external cause status: Secondary | ICD-10-CM | POA: Insufficient documentation

## 2016-06-24 DIAGNOSIS — Y929 Unspecified place or not applicable: Secondary | ICD-10-CM | POA: Insufficient documentation

## 2016-06-24 DIAGNOSIS — Y9301 Activity, walking, marching and hiking: Secondary | ICD-10-CM | POA: Diagnosis not present

## 2016-06-24 DIAGNOSIS — W1809XA Striking against other object with subsequent fall, initial encounter: Secondary | ICD-10-CM | POA: Diagnosis not present

## 2016-06-24 DIAGNOSIS — W19XXXA Unspecified fall, initial encounter: Secondary | ICD-10-CM

## 2016-06-24 DIAGNOSIS — M533 Sacrococcygeal disorders, not elsewhere classified: Secondary | ICD-10-CM | POA: Diagnosis not present

## 2016-06-24 DIAGNOSIS — Z87891 Personal history of nicotine dependence: Secondary | ICD-10-CM | POA: Diagnosis not present

## 2016-06-24 DIAGNOSIS — S3992XA Unspecified injury of lower back, initial encounter: Secondary | ICD-10-CM | POA: Diagnosis not present

## 2016-06-24 DIAGNOSIS — R51 Headache: Secondary | ICD-10-CM | POA: Diagnosis not present

## 2016-06-24 MED ORDER — ONDANSETRON HCL 4 MG PO TABS: 4.0000 mg | ORAL_TABLET | Freq: Three times a day (TID) | ORAL | 0 refills | Status: DC | PRN

## 2016-06-24 NOTE — ED Triage Notes (Signed)
Pt c/o fall with head injury x 2 days ago today h/a and dizziness. Also c/o tailbone pain

## 2016-06-24 NOTE — ED Provider Notes (Signed)
[facility] Provider Note   CSN: [id number] Arrival date & time: 06/24/16  1158     History   Chief Complaint Chief Complaint  Patient presents with  . Head Injury    HPI Robert Adams is a 71 y.o. male.  The history is provided by the patient. No language interpreter was used.  Head Injury     Robert Adams is a 71 y.o. male who presents to the Emergency Department complaining of head injury.  Yesterday he was walking down the steps when he misstepped on the last step and went to reach the hand rail. He twisted and fell backwards, striking the back of his head and his tailbone. He was then immediately dazed and dizzy and had a posterior headache as well as nauseousness. No vomiting. He has been able to walk without difficulty, no chest pain or fevers, vomiting. Today he has mild intermittent dizziness as well as mild headache and he comes in for evaluation. He does not take any blood thinners or have any medical problems.  Past Medical History:  Diagnosis Date  . Arthritis   . BPH (benign prostatic hyperplasia)   . GERD (gastroesophageal reflux disease)     There are no active problems to display for this patient.   Past Surgical History:  Procedure Laterality Date  . APPENDECTOMY    . Arthroscopic surgery     both knees  . COLONOSCOPY    . HERNIA REPAIR     bilat ing   . KNEE ARTHROSCOPY     rt and lt  . Right Shoulder Surgery         Home Medications    Prior to Admission medications   Medication Sig Start Date End Date Taking? Authorizing Provider  finasteride (PROSCAR) 5 MG tablet Take 5 mg by mouth every evening.      Historical Provider, MD  Tamsulosin HCl (FLOMAX) 0.4 MG CAPS Take by mouth Nightly.      Historical Provider, MD    Family History History reviewed. No pertinent family history.  Social History Social History  Substance Use Topics  . Smoking status: Former Smoker    Packs/day: 1.00    Years: 25.00    Quit date:  01/16/2009  . Smokeless tobacco: Never Used  . Alcohol use No     Allergies   Patient has no known allergies.   Review of Systems Review of Systems  All other systems reviewed and are negative.    Physical Exam Updated Vital Signs BP 137/75   Pulse 85   Temp 98.1 F (36.7 C)   Resp 16   Ht 5' 10.5" (1.791 m)   Wt 160 lb (72.6 kg)   SpO2 97%   BMI 22.63 kg/m   Physical Exam  Constitutional: He is oriented to person, place, and time. He appears well-developed and well-nourished.  HENT:  Head: Normocephalic and atraumatic.  Eyes: EOM are normal. Pupils are equal, round, and reactive to light.  Cardiovascular: Normal rate and regular rhythm.   No murmur heard. Pulmonary/Chest: Effort normal and breath sounds normal. No respiratory distress.  Abdominal: Soft. There is no tenderness. There is no rebound and no guarding.  Musculoskeletal:  No C, T, L-spine tenderness. There is mild tenderness over the left SI joint.  Neurological: He is alert and oriented to person, place, and time. No cranial nerve deficit.  5 out of 5 strength in all 4 extremities, sensation to light touch intact in all four  extremities.   Skin: Skin is warm and dry.  Psychiatric: He has a normal mood and affect. His behavior is normal.  Nursing note and vitals reviewed.    ED Treatments / Results  Labs (all labs ordered are listed, but only abnormal results are displayed) Labs Reviewed - No data to display  EKG  EKG Interpretation None       Radiology Ct Head Wo Contrast  Result Date: 06/24/2016 CLINICAL DATA:  Fall yesterday striking back of the head, occipital headache and dizziness, coccyx pain EXAM: CT HEAD WITHOUT CONTRAST TECHNIQUE: Contiguous axial images were obtained from the base of the skull through the vertex without intravenous contrast. COMPARISON:  None. FINDINGS: Brain: No intracranial hemorrhage, mass effect or midline shift. No acute cortical infarction. No mass lesion is  noted on this unenhanced scan. Mild cerebral atrophy. No intra or extra-axial fluid collection. No mass lesion is noted on this unenhanced scan. Vascular: Atherosclerotic calcifications of carotid siphon. Mild atherosclerotic calcifications vertebral arteries. Skull: No skull fracture is noted. Sinuses/Orbits: No acute findings Other: None IMPRESSION: No acute intracranial abnormality. Mild cerebral atrophy. No acute cortical infarction. Atherosclerotic calcifications of carotid siphon. Electronically Signed   By: Lahoma Crocker M.D.   On: 06/24/2016 12:55    Procedures Procedures (including critical care time)  Medications Ordered in ED Medications - No data to display   Initial Impression / Assessment and Plan / ED Course  I have reviewed the triage vital signs and the nursing notes.  Pertinent labs & imaging results that were available during my care of the patient were reviewed by me and considered in my medical decision making (see chart for details).     Patient here for evaluation of headache and dizziness following a fall yesterday. He is neurovascularly intact on examination. No acute intracranial abnormality on CT hand. The clinical picture is not consistent with cervical injury, vertebral dissection, cva. He is able to ambulate department without difficulty. Discussed home care and outpatient follow-up as well as return precautions.  Final Clinical Impressions(s) / ED Diagnoses   Final diagnoses:  Fall    New Prescriptions New Prescriptions   No medications on file     [name], MD 06/25/16 1210

## 2016-06-25 DIAGNOSIS — H401131 Primary open-angle glaucoma, bilateral, mild stage: Secondary | ICD-10-CM | POA: Diagnosis not present

## 2016-06-26 DIAGNOSIS — R42 Dizziness and giddiness: Secondary | ICD-10-CM | POA: Diagnosis not present

## 2016-08-06 DIAGNOSIS — M899 Disorder of bone, unspecified: Secondary | ICD-10-CM | POA: Diagnosis not present

## 2016-09-07 DIAGNOSIS — E119 Type 2 diabetes mellitus without complications: Secondary | ICD-10-CM | POA: Diagnosis not present

## 2016-10-22 ENCOUNTER — Emergency Department (HOSPITAL_BASED_OUTPATIENT_CLINIC_OR_DEPARTMENT_OTHER)
Admission: EM | Admit: 2016-10-22 | Discharge: 2016-10-22 | Disposition: A | Discharge status: Home / Self Care | Payer: Medicare Other | Attending: Physician Assistant | Admitting: Physician Assistant

## 2016-10-22 ENCOUNTER — Encounter (HOSPITAL_BASED_OUTPATIENT_CLINIC_OR_DEPARTMENT_OTHER): Payer: Self-pay | Admitting: Emergency Medicine

## 2016-10-22 ENCOUNTER — Emergency Department (HOSPITAL_BASED_OUTPATIENT_CLINIC_OR_DEPARTMENT_OTHER): Payer: Medicare Other

## 2016-10-22 DIAGNOSIS — J441 Chronic obstructive pulmonary disease with (acute) exacerbation: Secondary | ICD-10-CM | POA: Diagnosis not present

## 2016-10-22 DIAGNOSIS — R0602 Shortness of breath: Secondary | ICD-10-CM | POA: Diagnosis not present

## 2016-10-22 DIAGNOSIS — Z79899 Other long term (current) drug therapy: Secondary | ICD-10-CM | POA: Diagnosis not present

## 2016-10-22 DIAGNOSIS — Z87891 Personal history of nicotine dependence: Secondary | ICD-10-CM | POA: Insufficient documentation

## 2016-10-22 HISTORY — DX: Chronic obstructive pulmonary disease, unspecified: J44.9

## 2016-10-22 MED ORDER — ALBUTEROL SULFATE HFA 108 (90 BASE) MCG/ACT IN AERS
2.0000 | INHALATION_SPRAY | Freq: Once | RESPIRATORY_TRACT | Status: AC
  Administered 2016-10-22: 2 via RESPIRATORY_TRACT
  Filled 2016-10-22: qty 6.7

## 2016-10-22 MED ORDER — AZITHROMYCIN 250 MG PO TABS
500.0000 mg | ORAL_TABLET | Freq: Once | ORAL | Status: AC
  Administered 2016-10-22: 500 mg via ORAL
  Filled 2016-10-22: qty 2

## 2016-10-22 MED ORDER — AEROCHAMBER PLUS W/MASK MISC
1.0000 | Freq: Once | Status: AC
  Administered 2016-10-22: 1
  Filled 2016-10-22: qty 1

## 2016-10-22 MED ORDER — PREDNISONE 20 MG PO TABS: ORAL_TABLET | ORAL | 0 refills | Status: DC

## 2016-10-22 MED ORDER — PREDNISONE 50 MG PO TABS
60.0000 mg | ORAL_TABLET | Freq: Once | ORAL | Status: AC
  Administered 2016-10-22: 60 mg via ORAL
  Filled 2016-10-22: qty 1

## 2016-10-22 MED ORDER — AZITHROMYCIN 250 MG PO TABS: 250.0000 mg | ORAL_TABLET | Freq: Once | ORAL | 0 refills | Status: AC

## 2016-10-22 MED FILL — AZITHROMYCIN 250 MG TABLET: 250 | 4 days supply | Qty: 4 | Fill #0

## 2016-10-22 MED FILL — predniSONE 20 MG TABS: 20 | 9 days supply | Qty: 16 | Fill #0

## 2016-10-22 NOTE — Discharge Instructions (Signed)
Please follow-up with your primary care physician and an ENT physician. We are concerned that the source of breath, cough, clearing her throat could be a symptom of something much greater than COPD exacerbation.

## 2016-10-22 NOTE — ED Triage Notes (Signed)
Patient has had SOB for the last 4 days. Patient has been taking his wifes medications for COPD because he never got his filled. Patient has cough and clearing his throat frequently

## 2016-10-22 NOTE — ED Provider Notes (Signed)
[facility] DEPT [facility] Provider Note   CSN: [id number] Arrival date & time: 10/22/16  1541  By signing my name below, I, Robert Adams, attest that this documentation has been prepared under the direction and in the presence of physician practitioner, Thomasene Lot, Fredia Sorrow, MD. Electronically Signed: [name], Scribe. 10/22/2016. 4:50 PM.  History   Chief Complaint Chief Complaint  Patient presents with  . Shortness of Breath   The history is provided by the patient. No language interpreter was used.    HPI Comments: Robert Adams is a 71 y.o. male with PMHx including COPD who presents to the Emergency Department complaining of constant, gradually worsening dyspnea and wheezing for 4-5 days. He states his symptoms are worse with lying flat and believes they may be related to seasonal allergies. He has been using his wife's Ventolin and Symbicort without significant improvement of his symptoms; patient has used these medications in the past but is out of his personal prescriptions. He is a former smoker and has not smoked in 5 years. Patient had a scheduled appointment with his PCP today for the same but forgot about it and missed it. He denies cough, rhinorrhea, fevers, or any other associated symptoms.  Past Medical History:  Diagnosis Date  . Arthritis   . BPH (benign prostatic hyperplasia)   . COPD (chronic obstructive pulmonary disease) ([place])   . GERD (gastroesophageal reflux disease)     There are no active problems to display for this patient.   Past Surgical History:  Procedure Laterality Date  . APPENDECTOMY    . Arthroscopic surgery     both knees  . COLONOSCOPY    . HERNIA REPAIR     bilat ing   . KNEE ARTHROSCOPY     rt and lt  . Right Shoulder Surgery         Home Medications    Prior to Admission medications   Medication Sig Start Date End Date Taking? Authorizing Provider  albuterol (PROVENTIL HFA;VENTOLIN HFA) 108 (90 Base) MCG/ACT inhaler  Inhale into the lungs every 6 (six) hours as needed for wheezing or shortness of breath.   Yes [provider]  budesonide-formoterol (SYMBICORT) 80-4.5 MCG/ACT inhaler Inhale 2 puffs into the lungs 2 (two) times daily.   Yes [provider]  azithromycin (ZITHROMAX Z-PAK) 250 MG tablet Take 1 tablet (250 mg total) by mouth once. Take once daily 10/22/16 10/22/16  [name], MD  finasteride (PROSCAR) 5 MG tablet Take 5 mg by mouth every evening.      [provider]  ondansetron (ZOFRAN) 4 MG tablet Take 1 tablet (4 mg total) by mouth every 8 (eight) hours as needed for nausea or vomiting. 06/24/16   [name], MD  predniSONE (DELTASONE) 20 MG tablet Day 1 and 2: Take 3 tabs  Day 3-5: Take 2 tabs.  Day 5-8: take 1 tab 10/22/16   [name], MD  Tamsulosin HCl (FLOMAX) 0.4 MG CAPS Take by mouth Nightly.      [provider]    Family History History reviewed. No pertinent family history.  Social History Social History  Substance Use Topics  . Smoking status: Former Smoker    Packs/day: 1.00    Years: 25.00    Quit date: 01/16/2009  . Smokeless tobacco: Never Used  . Alcohol use No     Allergies   Patient has no known allergies.   Review of Systems Review of Systems  Constitutional: Negative for fever.  HENT: Negative for rhinorrhea.   Respiratory: Positive for shortness of breath and wheezing. Negative for cough.    Physical Exam Updated Vital Signs BP (!) 150/85 (BP Location: Right Arm)   Pulse 73   Temp 98.5 F (36.9 C) (Oral)   Resp 18   Ht 5\' 11"  (1.803 m)   Wt 74.8 kg (165 lb)   SpO2 99%   BMI 23.01 kg/m   Physical Exam  Constitutional: He is oriented to person, place, and time. He appears well-developed and well-nourished. No distress.  HENT:  Head: Normocephalic and atraumatic.  Eyes: Conjunctivae and EOM are normal.  Neck: Neck supple. No tracheal deviation present.  Cardiovascular: Normal rate.    Pulmonary/Chest: Effort normal. No respiratory distress.  Musculoskeletal: Normal range of motion.  Neurological: He is alert and oriented to person, place, and time.  Skin: Skin is warm and dry.  Psychiatric: He has a normal mood and affect. His behavior is normal.  Nursing note and vitals reviewed.  ED Treatments / Results  Labs (all labs ordered are listed, but only abnormal results are displayed) Labs Reviewed - No data to display  EKG  EKG Interpretation None       Radiology Dg Chest 2 View  Result Date: 10/22/2016 CLINICAL DATA:  Shortness of breath for 4 days. EXAM: CHEST  2 VIEW COMPARISON:  Chest radiographs 06/01/2008. More recent studies are unavailable. FINDINGS: The heart size and mediastinal contours are stable. There is mild aortic atherosclerosis. The lungs are clear. There is no pleural effusion or pneumothorax. Asymmetric density projecting over the anterior aspect of the left first rib on the frontal examination is stable, and appears osseous. Mild thoracic spine degenerative changes and probable postsurgical changes at the right [facility] joint are noted. IMPRESSION: No active cardiopulmonary process. Electronically Signed   By: Richardean Sale M.D.   On: 10/22/2016 16:06    Procedures Procedures (including critical care time)  DIAGNOSTIC STUDIES: Oxygen Saturation is 99% on RA, normal by my interpretation.    COORDINATION OF CARE: 4:50 PM Discussed treatment plan with pt at bedside and pt agreed to plan.  Medications Ordered in ED Medications  azithromycin (ZITHROMAX) tablet 500 mg (not administered)  predniSONE (DELTASONE) tablet 60 mg (not administered)  albuterol (PROVENTIL HFA;VENTOLIN HFA) 108 (90 Base) MCG/ACT inhaler 2 puff (not administered)  aerochamber plus with mask device 1 each (not administered)     Initial Impression / Assessment and Plan / ED Course  I have reviewed the triage vital signs and the nursing notes.  Pertinent labs & imaging  results that were available during my care of the patient were reviewed by me and considered in my medical decision making (see chart for details).    Patient is a very well-appearing 71 year old male presenting of increasing shortness of breath the last several days. Patient has not short of breath on exam, lungs sound clear, not tachypnic. Patient says it happens mostly at night in the morning. Patient has been told he is COPD in the past. We will treat him for COPD exacerbation. Concerned it could be a viral influenced lung inflammation. However given the fact that he smoked and he is having a tick his throat where he needs to cough a lot, I am concerned that there could be other neoplasm. We'll have him follow-up with ENT and his primary care physician for further testing.  Final Clinical Impressions(s) / ED Diagnoses   Final diagnoses:  COPD exacerbation ([place])    New Prescriptions  New Prescriptions   AZITHROMYCIN (ZITHROMAX Z-PAK) 250 MG TABLET    Take 1 tablet (250 mg total) by mouth once. Take once daily   PREDNISONE (DELTASONE) 20 MG TABLET    Day 1 and 2: Take 3 tabs  Day 3-5: Take 2 tabs.  Day 5-8: take 1 tab   I personally performed the services described in this documentation, which was scribed in my presence. The recorded information has been reviewed and is accurate.      Macarthur Critchley, MD 10/22/16 [phone]

## 2016-11-05 DIAGNOSIS — J989 Respiratory disorder, unspecified: Secondary | ICD-10-CM | POA: Diagnosis not present

## 2016-12-21 ENCOUNTER — Institutional Professional Consult (permissible substitution): Payer: Medicare Other | Admitting: Pulmonary Disease

## 2016-12-21 ENCOUNTER — Institutional Professional Consult (permissible substitution): Payer: Medicare Other | Admitting: Internal Medicine

## 2016-12-24 DIAGNOSIS — H40013 Open angle with borderline findings, low risk, bilateral: Secondary | ICD-10-CM | POA: Diagnosis not present

## 2016-12-24 DIAGNOSIS — H401131 Primary open-angle glaucoma, bilateral, mild stage: Secondary | ICD-10-CM | POA: Insufficient documentation

## 2016-12-27 ENCOUNTER — Encounter: Payer: Self-pay | Admitting: Internal Medicine

## 2016-12-27 ENCOUNTER — Ambulatory Visit (INDEPENDENT_AMBULATORY_CARE_PROVIDER_SITE_OTHER): Payer: Medicare Other | Admitting: Internal Medicine

## 2016-12-27 ENCOUNTER — Encounter (INDEPENDENT_AMBULATORY_CARE_PROVIDER_SITE_OTHER): Payer: Self-pay

## 2016-12-27 VITALS — BP 120/70 | HR 75 | Ht 70.5 in | Wt 167.0 lb

## 2016-12-27 DIAGNOSIS — J449 Chronic obstructive pulmonary disease, unspecified: Secondary | ICD-10-CM | POA: Diagnosis not present

## 2016-12-27 DIAGNOSIS — J441 Chronic obstructive pulmonary disease with (acute) exacerbation: Secondary | ICD-10-CM | POA: Insufficient documentation

## 2016-12-27 MED ORDER — BUDESONIDE-FORMOTEROL FUMARATE 160-4.5 MCG/ACT IN AERO: INHALATION_SPRAY | RESPIRATORY_TRACT | 12 refills | Status: DC

## 2016-12-27 NOTE — Patient Instructions (Signed)
Plan A = Automatic if having bad enough breathing to justify it = symbicort 160 Take 2 puffs first thing in am and then another 2 puffs about 12 hours later.    Work on inhaler technique:  relax and gently blow all the way out then take a nice smooth deep breath back in, triggering the inhaler at same time you start breathing in.  Hold for up to 5 seconds if you can. Blow out thru nose. Rinse and gargle with water when done      Plan B = Backup Only use your albuterol (proair) as a rescue medication to be used if you can't catch your breath by resting or doing a relaxed purse lip breathing pattern.  - The less you use it, the better it will work when you need it. - Ok to use the inhaler up to 2 puffs  every 4 hours if you must but call for appointment if use goes up over your usual need - Don't leave home without it !!  (think of it like the spare tire for your car)     If you are satisfied with your treatment plan,  let your doctor know and he/she can either refill your medications or you can return here when your prescription runs out.     If in any way you are not 100% satisfied,  please tell us.  If 100% better, tell your friends!  Pulmonary follow up is as needed

## 2016-12-27 NOTE — Progress Notes (Signed)
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 Pulmonary Consult    Referred by Dr. Claris Adams. Pt c/o SOB x 4 months. He states he gets SOB if he walks too fast, walks up an incline or does "too much work".    doe = MMRC1 = can walk nl pace, flat grade, can't hurry or go up hills or steps s sob mb and back 200 ft slt grade walking back to house but not so sob he has to stop when gets back  Some better after saba/ no need at rest or noct / now rarely using saba   No obvious day to day or daytime variability or assoc excess/ purulent sputum or mucus plugs or hemoptysis or cp or chest tightness, subjective wheeze or overt sinus or hb symptoms. No unusual exp hx or h/o childhood pna/ asthma or knowledge of premature birth.  Sleeping ok without nocturnal  or early am exacerbation  of respiratory  c/o's or need for noct saba. Also denies any obvious fluctuation of symptoms with weather or environmental changes or other aggravating or alleviating factors except as outlined above   Current Medications, Allergies, Complete Past Medical History, Past Surgical History, Family History, and Social History were reviewed in [employer] record.  ROS  The following are not active complaints unless bolded sore throat, dysphagia, dental problems, itching, sneezing,  nasal congestion or excess/ purulent secretions, ear ache,   fever, chills, sweats, unintended wt loss, classically pleuritic or exertional cp,  orthopnea pnd or leg swelling, presyncope, palpitations, abdominal pain, anorexia, nausea, vomiting, diarrhea  or change in  bowel or bladder habits, change in stools or urine, dysuria,hematuria,  rash, arthralgias, visual complaints, headache, numbness, weakness or ataxia or problems with walking or coordination,  change in mood/affect or memory.               Review of Systems     Objective:   Physical Exam   amb slt hoarse wm nad  Wt Readings from Last 3 Encounters:  12/27/16 167 lb (75.8 kg)  10/22/16 165 lb (74.8 kg)  06/24/16 160 lb (72.6 kg)    Vital signs reviewed - Note on arrival 02 sats  95% on RA     HEENT: nl dentition, turbinates bilaterally, and oropharynx. Nl external ear canals without cough reflex   NECK :  without JVD/Nodes/TM/ nl carotid upstrokes bilaterally   LUNGS: no acc muscle use,  slt barrel chest, distant bs s wheeze   CV:  RRR  no s3 or murmur or increase in P2, and no edema   ABD:  soft and nontender with pos hoover's late insp  in the supine position. No bruits or organomegaly appreciated, bowel sounds nl  MS:  Nl gait/ ext warm without deformities, calf tenderness, cyanosis or clubbing No obvious joint restrictions   SKIN: warm and dry without lesions    NEURO:  alert, approp, nl sensorium with  no motor or cerebellar deficits apparent.     I personally reviewed images and agree with radiology impression  as follows:  CXR:   10/22/16 The heart size and mediastinal contours are stable. There is mild aortic atherosclerosis. The lungs are clear. There is no pleural effusion or pneumothorax. Asymmetric density projecting over the anterior aspect of the left first rib on the frontal examination is stable, and appears osseous. Mild thoracic spine degenerative changes and probable postsurgical changes at the right [facility] joint are noted.    Assessment:

## 2016-12-28 NOTE — Assessment & Plan Note (Signed)
Quit smoking  2012  - Spirometry 12/27/2016  FEV1 1.45 (45%)  Ratio 47  With no rx prior  - 12/27/2016  After extensive coaching HFA effectiveness =    75% from a baseline of 50%  Although he technically has severe airflow obst on spirometry, he is only barely GOLD III and  doesn't have much in terms of limiting symptoms and no tendency to over use of saba but note he has had one flare of copd and the greatest risk of recurrence is h/o exacerbation so he is a good candidate to use symbicort 160 2bid for any flare of sob or cough or need for saba.  symbicort is ideal for this as onset of action is in 5 min but he needs to understand once he starts using it should take 2 bid until 100% back to baseline.   He doesn't appear receptive to using any kind of maint long term inhaler, so I thinkg this is his best option.    Discussed with Pt:  I reviewed the Fletcher curve with the patient that basically indicates  if you quit smoking when your best day FEV1 is still well preserved (as is clearly  the case here)  it is highly unlikely you will progress to severe disease and informed the patient there was  no medication on the market that has proven to alter the curve/ its downward trajectory  or the likelihood of progression of their disease(unlike other chronic medical conditions such as atheroclerosis where we do think we can change the natural hx with risk reducing meds)    Therefore  maintaining abstinence is the most important aspect of care, not choice of inhalers or for that matter, doctors, and pulmonary f/u can be prn    Total time devoted to counseling  > 50 % of initial 60 min office visit:  review case with pt/ discussion of options/alternatives/ personally creating written customized instructions  in presence of pt  then going over those specific  Instructions directly with the pt including how to use all of the meds but in particular covering each new medication in detail and the difference between  the maintenance= "automatic" meds and the prns using an action plan format for the latter (If this problem/symptom => do that organization reading Left to right).  Please see AVS from this visit for a full list of these instructions which I personally wrote for this pt and  are unique to this visit.

## 2017-01-18 DIAGNOSIS — J989 Respiratory disorder, unspecified: Secondary | ICD-10-CM | POA: Diagnosis not present

## 2017-01-18 DIAGNOSIS — N401 Enlarged prostate with lower urinary tract symptoms: Secondary | ICD-10-CM | POA: Diagnosis not present

## 2017-01-18 DIAGNOSIS — Z79899 Other long term (current) drug therapy: Secondary | ICD-10-CM | POA: Diagnosis not present

## 2017-01-18 DIAGNOSIS — Z1211 Encounter for screening for malignant neoplasm of colon: Secondary | ICD-10-CM | POA: Diagnosis not present

## 2017-01-18 DIAGNOSIS — Z23 Encounter for immunization: Secondary | ICD-10-CM | POA: Diagnosis not present

## 2017-01-18 DIAGNOSIS — R351 Nocturia: Secondary | ICD-10-CM | POA: Diagnosis not present

## 2017-01-18 DIAGNOSIS — G4700 Insomnia, unspecified: Secondary | ICD-10-CM | POA: Diagnosis not present

## 2017-01-21 ENCOUNTER — Other Ambulatory Visit: Payer: Self-pay | Admitting: Family Medicine

## 2017-01-21 DIAGNOSIS — Z87891 Personal history of nicotine dependence: Secondary | ICD-10-CM

## 2017-01-25 ENCOUNTER — Inpatient Hospital Stay
Admission: RE | Admit: 2017-01-25 | Discharge: 2017-01-25 | Disposition: A | Discharge status: Home / Self Care | Payer: Medicare Other | Source: Ambulatory Visit | Attending: Family Medicine | Admitting: Family Medicine

## 2017-01-30 ENCOUNTER — Ambulatory Visit
Admission: RE | Admit: 2017-01-30 | Discharge: 2017-01-30 | Disposition: A | Discharge status: Home / Self Care | Payer: Medicare Other | Source: Ambulatory Visit | Attending: Family Medicine | Admitting: Family Medicine

## 2017-01-30 DIAGNOSIS — Z87891 Personal history of nicotine dependence: Secondary | ICD-10-CM | POA: Diagnosis not present

## 2017-02-05 DIAGNOSIS — N390 Urinary tract infection, site not specified: Secondary | ICD-10-CM | POA: Diagnosis not present

## 2017-02-05 DIAGNOSIS — J439 Emphysema, unspecified: Secondary | ICD-10-CM | POA: Diagnosis not present

## 2017-02-05 DIAGNOSIS — F411 Generalized anxiety disorder: Secondary | ICD-10-CM | POA: Diagnosis not present

## 2017-02-05 DIAGNOSIS — I2510 Atherosclerotic heart disease of native coronary artery without angina pectoris: Secondary | ICD-10-CM | POA: Diagnosis not present

## 2017-02-06 DIAGNOSIS — N410 Acute prostatitis: Secondary | ICD-10-CM | POA: Diagnosis not present

## 2017-02-23 DIAGNOSIS — J449 Chronic obstructive pulmonary disease, unspecified: Secondary | ICD-10-CM | POA: Diagnosis not present

## 2017-02-25 DIAGNOSIS — J189 Pneumonia, unspecified organism: Secondary | ICD-10-CM | POA: Diagnosis not present

## 2017-05-03 DIAGNOSIS — G4700 Insomnia, unspecified: Secondary | ICD-10-CM | POA: Diagnosis not present

## 2017-05-03 DIAGNOSIS — I2510 Atherosclerotic heart disease of native coronary artery without angina pectoris: Secondary | ICD-10-CM | POA: Diagnosis not present

## 2017-05-03 DIAGNOSIS — F411 Generalized anxiety disorder: Secondary | ICD-10-CM | POA: Diagnosis not present

## 2017-05-06 DIAGNOSIS — L989 Disorder of the skin and subcutaneous tissue, unspecified: Secondary | ICD-10-CM | POA: Diagnosis not present

## 2017-05-06 DIAGNOSIS — I10 Essential (primary) hypertension: Secondary | ICD-10-CM | POA: Diagnosis not present

## 2017-05-06 DIAGNOSIS — R7301 Impaired fasting glucose: Secondary | ICD-10-CM | POA: Diagnosis not present

## 2017-05-06 DIAGNOSIS — Z125 Encounter for screening for malignant neoplasm of prostate: Secondary | ICD-10-CM | POA: Diagnosis not present

## 2017-05-06 DIAGNOSIS — N400 Enlarged prostate without lower urinary tract symptoms: Secondary | ICD-10-CM | POA: Diagnosis not present

## 2017-05-06 DIAGNOSIS — E785 Hyperlipidemia, unspecified: Secondary | ICD-10-CM | POA: Diagnosis not present

## 2017-05-06 DIAGNOSIS — D2239 Melanocytic nevi of other parts of face: Secondary | ICD-10-CM | POA: Diagnosis not present

## 2017-05-06 DIAGNOSIS — H6123 Impacted cerumen, bilateral: Secondary | ICD-10-CM | POA: Diagnosis not present

## 2017-05-06 DIAGNOSIS — L820 Inflamed seborrheic keratosis: Secondary | ICD-10-CM | POA: Diagnosis not present

## 2017-05-22 DIAGNOSIS — I2510 Atherosclerotic heart disease of native coronary artery without angina pectoris: Secondary | ICD-10-CM | POA: Diagnosis not present

## 2017-05-22 DIAGNOSIS — J449 Chronic obstructive pulmonary disease, unspecified: Secondary | ICD-10-CM | POA: Diagnosis not present

## 2017-05-22 DIAGNOSIS — K219 Gastro-esophageal reflux disease without esophagitis: Secondary | ICD-10-CM | POA: Diagnosis not present

## 2017-05-22 DIAGNOSIS — F411 Generalized anxiety disorder: Secondary | ICD-10-CM | POA: Diagnosis not present

## 2017-05-29 DIAGNOSIS — M79662 Pain in left lower leg: Secondary | ICD-10-CM | POA: Diagnosis not present

## 2017-05-29 DIAGNOSIS — R7989 Other specified abnormal findings of blood chemistry: Secondary | ICD-10-CM | POA: Diagnosis not present

## 2017-05-29 DIAGNOSIS — R0602 Shortness of breath: Secondary | ICD-10-CM | POA: Diagnosis not present

## 2017-05-29 DIAGNOSIS — I700 Atherosclerosis of aorta: Secondary | ICD-10-CM | POA: Diagnosis not present

## 2017-05-29 DIAGNOSIS — M7989 Other specified soft tissue disorders: Secondary | ICD-10-CM | POA: Diagnosis not present

## 2017-05-29 DIAGNOSIS — J439 Emphysema, unspecified: Secondary | ICD-10-CM | POA: Diagnosis not present

## 2017-06-03 DIAGNOSIS — I700 Atherosclerosis of aorta: Secondary | ICD-10-CM | POA: Diagnosis not present

## 2017-06-03 DIAGNOSIS — R252 Cramp and spasm: Secondary | ICD-10-CM | POA: Diagnosis not present

## 2017-06-03 DIAGNOSIS — J449 Chronic obstructive pulmonary disease, unspecified: Secondary | ICD-10-CM | POA: Diagnosis not present

## 2017-06-03 DIAGNOSIS — I2510 Atherosclerotic heart disease of native coronary artery without angina pectoris: Secondary | ICD-10-CM | POA: Diagnosis not present

## 2017-06-12 ENCOUNTER — Encounter: Payer: Self-pay | Admitting: Cardiovascular Disease

## 2017-06-12 ENCOUNTER — Ambulatory Visit (INDEPENDENT_AMBULATORY_CARE_PROVIDER_SITE_OTHER): Payer: Medicare Other | Admitting: Cardiovascular Disease

## 2017-06-12 ENCOUNTER — Encounter (INDEPENDENT_AMBULATORY_CARE_PROVIDER_SITE_OTHER): Payer: Self-pay

## 2017-06-12 VITALS — BP 122/74 | HR 74 | Ht 70.5 in | Wt 169.0 lb

## 2017-06-12 DIAGNOSIS — R0609 Other forms of dyspnea: Secondary | ICD-10-CM | POA: Insufficient documentation

## 2017-06-12 NOTE — Assessment & Plan Note (Signed)
Mr. Recupero has a long history of tobacco abuse having smoked 50 pack years and stopped 7 years ago. He did notice the onset of dyspnea over the last 3-4 months but denies chest pain. I am getting a 2-D echo and exercise Myoview stress test to rule out an ischemic etiology.

## 2017-06-12 NOTE — Progress Notes (Signed)
06/12/2017 Robert Adams   09/27/1945  [id number]  Primary Physician Hayden Rasmussen, MD Primary Cardiologist: Lorretta Harp MD [name], [place]  HPI:  Robert Adams is a 72 y.o. thin-appearing married Caucasian male father of 2, grandfather of 4 grandchildren who is retired from being a Higher education careers adviser. He was referred by Dr. Darron Doom for cardiovascular evaluation because of shortness of breath. His risk factors include 50 pack years having quit 7 years ago. He apparently has a brother who had stenting. He has no other risk factors. He's never had a heart attack or stroke. He has noticed increasing dyspnea on exertion in the last 3-4 months.    Current Meds  Medication Sig  . albuterol (PROVENTIL HFA;VENTOLIN HFA) 108 (90 Base) MCG/ACT inhaler Inhale into the lungs every 6 (six) hours as needed for wheezing or shortness of breath.  . budesonide-formoterol (SYMBICORT) 160-4.5 MCG/ACT inhaler Take 2 puffs first thing in am and then another 2 puffs about 12 hours later.  Marland Kitchen CINNAMON PO Take 1 tablet by mouth.  . clonazePAM (KLONOPIN) 1 MG tablet Take 1 tablet by mouth at bedtime as needed.  . finasteride (PROSCAR) 5 MG tablet Take 5 mg by mouth every evening.    Marland Kitchen MAGNESIUM PO Take 1 tablet by mouth daily.  . Multiple Vitamins-Minerals (PRESERVISION AREDS PO) Take 1 tablet by mouth daily.  . Tamsulosin HCl (FLOMAX) 0.4 MG CAPS Take by mouth Nightly.       No Known Allergies  Social History   Socioeconomic History  . Marital status: Married    Spouse name: Not on file  . Number of children: Not on file  . Years of education: Not on file  . Highest education level: Not on file  Social Needs  . Financial resource strain: Not on file  . Food insecurity - worry: Not on file  . Food insecurity - inability: Not on file  . Transportation needs - medical: Not on file  . Transportation needs - non-medical: Not on file  Occupational History  . Not on file  Tobacco Use  .  Smoking status: Former Smoker    Packs/day: 1.00    Years: 30.00    Pack years: 30.00    Last attempt to quit: 06/04/2010    Years since quitting: 7.0  . Smokeless tobacco: Never Used  Substance and Sexual Activity  . Alcohol use: No  . Drug use: No  . Sexual activity: Not on file  Other Topics Concern  . Not on file  Social History Narrative  . Not on file     Review of Systems: General: negative for chills, fever, night sweats or weight changes.  Cardiovascular: negative for chest pain, dyspnea on exertion, edema, orthopnea, palpitations, paroxysmal nocturnal dyspnea or shortness of breath Dermatological: negative for rash Respiratory: negative for cough or wheezing Urologic: negative for hematuria Abdominal: negative for nausea, vomiting, diarrhea, bright red blood per rectum, melena, or hematemesis Neurologic: negative for visual changes, syncope, or dizziness All other systems reviewed and are otherwise negative except as noted above.    Blood pressure 122/74, pulse 74, height 5' 10.5" (1.791 m), weight 169 lb (76.7 kg).  General appearance: alert and no distress Neck: no adenopathy, no carotid bruit, no JVD, supple, symmetrical, trachea midline and thyroid not enlarged, symmetric, no tenderness/mass/nodules Lungs: clear to auscultation bilaterally Heart: regular rate and rhythm, S1, S2 normal, no murmur, click, rub or gallop Extremities: extremities normal, atraumatic, no cyanosis  or edema Pulses: 2+ and symmetric Skin: Skin color, texture, turgor normal. No rashes or lesions Neurologic: Alert and oriented X 3, normal strength and tone. Normal symmetric reflexes. Normal coordination and gait  EKG normal sinus rhythm at 75 with incomplete left bundle branch block, left axis deviation and septal Q waves. I personally reviewed his EKG.  ASSESSMENT AND PLAN:   Dyspnea on exertion Mr. Fruth has a long history of tobacco abuse having smoked 50 pack years and stopped 7 years  ago. He did notice the onset of dyspnea over the last 3-4 months but denies chest pain. I am getting a 2-D echo and exercise Myoview stress test to rule out an ischemic etiology.      Lorretta Harp MD FACP,FACC,FAHA, [facility] 06/12/2017 10:24 AM

## 2017-06-12 NOTE — Patient Instructions (Signed)
Medication Instructions: Your physician recommends that you continue on your current medications as directed. Please refer to the Current Medication list given to you today.   Testing/Procedures: Your physician has requested that you have an echocardiogram. Echocardiography is a painless test that uses sound waves to create images of your heart. It provides your doctor with information about the size and shape of your heart and how well your heart's chambers and valves are working. This procedure takes approximately one hour. There are no restrictions for this procedure.  Your physician has requested that you have en exercise stress myoview. For further information please visit [email or web address]. Please follow instruction sheet, as given.  Follow-Up: Your physician recommends that you schedule a follow-up appointment as needed with Dr. Gwenlyn Found.

## 2017-06-14 ENCOUNTER — Telehealth (HOSPITAL_COMMUNITY): Payer: Self-pay

## 2017-06-14 NOTE — Telephone Encounter (Signed)
Encounter complete. 

## 2017-06-17 ENCOUNTER — Telehealth: Payer: Self-pay | Admitting: Cardiovascular Disease

## 2017-06-17 NOTE — Telephone Encounter (Signed)
New message     patient states he can not have myocardial perfusion test done , does not feel he is capable of doing treadmil

## 2017-06-17 NOTE — Telephone Encounter (Signed)
Spoke with patient and he does not feel he can walk on the treadmill secondary to his breathing and leg pain. Will forward to Dr Gwenlyn Found for recommendations.

## 2017-06-18 NOTE — Telephone Encounter (Signed)
Spoke with patient and told him I would let Dr Gwenlyn Found know that he canceled the previous stress test and wanted to know what other test he could do in its place. Informed him that Dr Gwenlyn Found is in clinic and it may be the end of the day before he hears back. He voiced understanding.

## 2017-06-19 ENCOUNTER — Inpatient Hospital Stay (HOSPITAL_COMMUNITY)
Admission: RE | Admit: 2017-06-19 | Payer: Medicare Other | Source: Ambulatory Visit | Attending: Cardiovascular Disease | Admitting: Cardiovascular Disease

## 2017-06-19 NOTE — Telephone Encounter (Signed)
He can have a Lexiscan MV

## 2017-06-24 ENCOUNTER — Ambulatory Visit (HOSPITAL_COMMUNITY): Payer: Medicare Other | Attending: Internal Medicine

## 2017-06-24 ENCOUNTER — Other Ambulatory Visit: Payer: Self-pay

## 2017-06-24 DIAGNOSIS — Z87891 Personal history of nicotine dependence: Secondary | ICD-10-CM | POA: Diagnosis not present

## 2017-06-24 DIAGNOSIS — R0609 Other forms of dyspnea: Secondary | ICD-10-CM | POA: Diagnosis not present

## 2017-06-26 ENCOUNTER — Telehealth (HOSPITAL_COMMUNITY): Payer: Self-pay | Admitting: Cardiovascular Disease

## 2017-06-26 NOTE — Telephone Encounter (Signed)
Patient (patient states he can not have test done , does not feel he is capable of doing treadmill )  Patient cancelled appt on 06/17/17.

## 2017-06-28 ENCOUNTER — Other Ambulatory Visit: Payer: Self-pay | Admitting: Cardiovascular Disease

## 2017-06-28 DIAGNOSIS — R0609 Other forms of dyspnea: Principal | ICD-10-CM

## 2017-06-28 NOTE — Telephone Encounter (Signed)
Order placed

## 2017-06-28 NOTE — Telephone Encounter (Signed)
Called pt to inform him someone will be calling on Monday to arrange Lexiscan. Also explained that he will be sitting for this test. Pt verbalized understanding and thanks.  Message sent to scheduling to arrange this.

## 2017-07-01 ENCOUNTER — Telehealth: Payer: Self-pay | Admitting: Cardiovascular Disease

## 2017-07-01 NOTE — Telephone Encounter (Signed)
Tried calling the patient back. There was not any answer or voicemail set up.

## 2017-07-01 NOTE — Telephone Encounter (Signed)
New message ° ° ° °Patient calling for echo results. Please call °

## 2017-07-02 NOTE — Telephone Encounter (Signed)
Spoke with pt wife, aware of echo results. She reports his bp has been running high in the evening only. She is unable to give any numbers at this time. They will track his bp and let us know if it is consistently elevated.

## 2017-07-08 ENCOUNTER — Telehealth (HOSPITAL_COMMUNITY): Payer: Self-pay | Admitting: *Deleted

## 2017-07-08 DIAGNOSIS — H2513 Age-related nuclear cataract, bilateral: Secondary | ICD-10-CM | POA: Diagnosis not present

## 2017-07-08 DIAGNOSIS — H40013 Open angle with borderline findings, low risk, bilateral: Secondary | ICD-10-CM | POA: Diagnosis not present

## 2017-07-08 NOTE — Telephone Encounter (Signed)
Patient given detailed instructions per Myocardial Perfusion Study Information Sheet for the test on 07/10/17 at 1000. Patient notified to arrive 15 minutes early and that it is imperative to arrive on time for appointment to keep from having the test rescheduled.  If you need to cancel or reschedule your appointment, please call the office within 24 hours of your appointment. . Patient verbalized understanding.Sharene Krikorian, Ranae [place]ms

## 2017-07-08 NOTE — Telephone Encounter (Signed)
Lexiscan scheduled for 07/10/17

## 2017-07-10 ENCOUNTER — Ambulatory Visit (HOSPITAL_COMMUNITY): Payer: Medicare Other | Attending: Cardiology

## 2017-07-10 DIAGNOSIS — Z8249 Family history of ischemic heart disease and other diseases of the circulatory system: Secondary | ICD-10-CM | POA: Insufficient documentation

## 2017-07-10 DIAGNOSIS — R9439 Abnormal result of other cardiovascular function study: Secondary | ICD-10-CM | POA: Insufficient documentation

## 2017-07-10 DIAGNOSIS — I2510 Atherosclerotic heart disease of native coronary artery without angina pectoris: Secondary | ICD-10-CM | POA: Insufficient documentation

## 2017-07-10 DIAGNOSIS — R0609 Other forms of dyspnea: Secondary | ICD-10-CM | POA: Insufficient documentation

## 2017-07-10 LAB — MYOCARDIAL PERFUSION IMAGING
CHL CUP NUCLEAR SSS: 19
CSEPPHR: 87 {beats}/min
LV sys vol: 53 mL
LVDIAVOL: 114 mL (ref 62–150)
RATE: 0.34
Rest HR: 66 {beats}/min
SDS: 3
SRS: 16
TID: 1.03

## 2017-07-10 MED ORDER — TECHNETIUM TC 99M TETROFOSMIN IV KIT
10.4000 | PACK | Freq: Once | INTRAVENOUS | Status: AC | PRN
  Administered 2017-07-10: 10.4 via INTRAVENOUS
  Filled 2017-07-10: qty 11

## 2017-07-10 MED ORDER — REGADENOSON 0.4 MG/5ML IV SOLN
0.4000 mg | Freq: Once | INTRAVENOUS | Status: AC
  Administered 2017-07-10: 0.4 mg via INTRAVENOUS

## 2017-07-10 MED ORDER — TECHNETIUM TC 99M TETROFOSMIN IV KIT
31.6000 | PACK | Freq: Once | INTRAVENOUS | Status: AC | PRN
  Administered 2017-07-10: 31.6 via INTRAVENOUS
  Filled 2017-07-10: qty 32

## 2017-07-11 ENCOUNTER — Telehealth: Payer: Self-pay | Admitting: Cardiovascular Disease

## 2017-07-11 NOTE — Telephone Encounter (Signed)
Returned call to patient.Advised Dr.Berry will discuss stress test results at 07/26/17 visit.He stated his sob seems worse,would like to see Dr.Berry sooner.Advised I will send message to Dr.Berry's CMA for a sooner visit.

## 2017-07-11 NOTE — Telephone Encounter (Signed)
New message ° ° ° °Patient calling for stress test results. Please call °

## 2017-07-15 DIAGNOSIS — I2510 Atherosclerotic heart disease of native coronary artery without angina pectoris: Secondary | ICD-10-CM | POA: Diagnosis not present

## 2017-07-15 DIAGNOSIS — J449 Chronic obstructive pulmonary disease, unspecified: Secondary | ICD-10-CM | POA: Diagnosis not present

## 2017-07-15 DIAGNOSIS — K219 Gastro-esophageal reflux disease without esophagitis: Secondary | ICD-10-CM | POA: Diagnosis not present

## 2017-07-22 DIAGNOSIS — J449 Chronic obstructive pulmonary disease, unspecified: Secondary | ICD-10-CM | POA: Diagnosis not present

## 2017-07-22 DIAGNOSIS — F411 Generalized anxiety disorder: Secondary | ICD-10-CM | POA: Diagnosis not present

## 2017-07-22 DIAGNOSIS — I2510 Atherosclerotic heart disease of native coronary artery without angina pectoris: Secondary | ICD-10-CM | POA: Diagnosis not present

## 2017-07-26 ENCOUNTER — Encounter: Payer: Self-pay | Admitting: Cardiovascular Disease

## 2017-07-26 ENCOUNTER — Ambulatory Visit (INDEPENDENT_AMBULATORY_CARE_PROVIDER_SITE_OTHER): Payer: Medicare Other | Admitting: Cardiovascular Disease

## 2017-07-26 VITALS — BP 111/73 | HR 90 | Ht 70.5 in | Wt 167.2 lb

## 2017-07-26 DIAGNOSIS — Z01818 Encounter for other preprocedural examination: Secondary | ICD-10-CM

## 2017-07-26 DIAGNOSIS — R0609 Other forms of dyspnea: Secondary | ICD-10-CM

## 2017-07-26 DIAGNOSIS — R9439 Abnormal result of other cardiovascular function study: Secondary | ICD-10-CM | POA: Diagnosis not present

## 2017-07-26 LAB — CBC
HEMATOCRIT: 48.4 % (ref 37.5–51.0)
HEMOGLOBIN: 16.9 g/dL (ref 13.0–17.7)
MCH: 30.6 pg (ref 26.6–33.0)
MCHC: 34.9 g/dL (ref 31.5–35.7)
MCV: 88 fL (ref 79–97)
Platelets: 408 10*3/uL — ABNORMAL HIGH (ref 150–379)
RBC: 5.53 x10E6/uL (ref 4.14–5.80)
RDW: 13.8 % (ref 12.3–15.4)
WBC: 9.9 10*3/uL (ref 3.4–10.8)

## 2017-07-26 LAB — BASIC METABOLIC PANEL
BUN/Creatinine Ratio: 22 (ref 10–24)
BUN: 19 mg/dL (ref 8–27)
CALCIUM: 9.8 mg/dL (ref 8.6–10.2)
CO2: 23 mmol/L (ref 20–29)
Chloride: 100 mmol/L (ref 96–106)
Creatinine, Ser: 0.87 mg/dL (ref 0.76–1.27)
GFR, EST AFRICAN AMERICAN: 98 mL/min/{1.73_m2} (ref >59)
GFR, EST NON AFRICAN AMERICAN: 85 mL/min/{1.73_m2} (ref >59)
Glucose: 80 mg/dL (ref 65–99)
Potassium: 5 mmol/L (ref 3.5–5.2)
Sodium: 138 mmol/L (ref 134–144)

## 2017-07-26 LAB — PROTIME-INR
INR: 1.1 (ref 0.8–1.2)
Prothrombin Time: 11.2 s (ref 9.1–12.0)

## 2017-07-26 NOTE — Progress Notes (Signed)
07/26/2017 Robert Adams   [birth year]  [id number]  Primary Physician Hayden Rasmussen, MD Primary Cardiologist: Lorretta Harp MD Robert Adams, [place]  HPI:  Robert Adams is a 72 y.o.   thin-appearing married Caucasian male father of 2, grandfather of 4 grandchildren who is retired from being a Higher education careers adviser. He was referred by Dr. Darron Doom for cardiovascular evaluation because of shortness of breath. I last saw him in the office 06/12/17. His risk factors include 50 pack years having quit 7 years ago. He apparently has a brother who had stenting. He has no other risk factors. He's never had a heart attack or stroke. He has noticed increasing dyspnea on exertion in the last 3-4 months.   He had a Myoview stress test that showed inferior scar and an 2-D echo that was essentially normal. Based on this I decided to proceed with outpatient diagnostic coronary angiography.     Current Meds  Medication Sig  . albuterol (PROVENTIL HFA;VENTOLIN HFA) 108 (90 Base) MCG/ACT inhaler Inhale into the lungs every 6 (six) hours as needed for wheezing or shortness of breath.  . budesonide-formoterol (SYMBICORT) 160-4.5 MCG/ACT inhaler Take 2 puffs first thing in am and then another 2 puffs about 12 hours later.  Marland Kitchen CINNAMON PO Take 1 tablet by mouth.  . clonazePAM (KLONOPIN) 1 MG tablet Take 1 tablet by mouth at bedtime as needed.  . finasteride (PROSCAR) 5 MG tablet Take 5 mg by mouth every evening.    Marland Kitchen losartan (COZAAR) 25 MG tablet at bedtime.  Marland Kitchen MAGNESIUM PO Take 1 tablet by mouth daily.  . Multiple Vitamins-Minerals (PRESERVISION AREDS PO) Take 1 tablet by mouth daily.  . Tamsulosin HCl (FLOMAX) 0.4 MG CAPS Take by mouth Nightly.       No Known Allergies  Social History   Socioeconomic History  . Marital status: Married    Spouse name: Not on file  . Number of children: Not on file  . Years of education: Not on file  . Highest education level: Not on file  Social Needs  .  Financial resource strain: Not on file  . Food insecurity - worry: Not on file  . Food insecurity - inability: Not on file  . Transportation needs - medical: Not on file  . Transportation needs - non-medical: Not on file  Occupational History  . Not on file  Tobacco Use  . Smoking status: Former Smoker    Packs/day: 1.00    Years: 30.00    Pack years: 30.00    Last attempt to quit: 06/04/2010    Years since quitting: 7.1  . Smokeless tobacco: Never Used  Substance and Sexual Activity  . Alcohol use: No  . Drug use: No  . Sexual activity: Not on file  Other Topics Concern  . Not on file  Social History Narrative  . Not on file     Review of Systems: General: negative for chills, fever, night sweats or weight changes.  Cardiovascular: negative for chest pain, dyspnea on exertion, edema, orthopnea, palpitations, paroxysmal nocturnal dyspnea or shortness of breath Dermatological: negative for rash Respiratory: negative for cough or wheezing Urologic: negative for hematuria Abdominal: negative for nausea, vomiting, diarrhea, bright red blood per rectum, melena, or hematemesis Neurologic: negative for visual changes, syncope, or dizziness All other systems reviewed and are otherwise negative except as noted above.    Blood pressure 111/73, pulse 90, height 5' 10.5" (1.791 m), weight 167 lb 3.2  oz (75.8 kg).  General appearance: alert and no distress Neck: no adenopathy, no carotid bruit, no JVD, supple, symmetrical, trachea midline and thyroid not enlarged, symmetric, no tenderness/mass/nodules Lungs: clear to auscultation bilaterally Heart: regular rate and rhythm, S1, S2 normal, no murmur, click, rub or gallop Extremities: extremities normal, atraumatic, no cyanosis or edema Pulses: 2+ and symmetric Skin: Skin color, texture, turgor normal. No rashes or lesions Neurologic: Alert and oriented X 3, normal strength and tone. Normal symmetric reflexes. Normal coordination and  gait  EKG not performed today  ASSESSMENT AND PLAN:   Dyspnea on exertion Mr. Zenon returns today for follow-up of his noninvasive test previous 2-D echo was essentially normal although his Myoview stress test showed inferior scar. Based on this I decided to proceed with outpatient radial diagnostic coronary angiography.The patient understands that risks included but are not limited to stroke (1 in 1000), death (1 in 58), kidney failure [usually temporary] (1 in 500), bleeding (1 in 200), allergic reaction [possibly serious] (1 in 200). The patient understands and agrees to proceed      Lorretta Harp MD [facility], [facility] 07/26/2017 11:03 AM

## 2017-07-26 NOTE — Patient Instructions (Signed)
   [facility] [address] [place] [address] Dept: [phone] Loc: [place]  07/26/2017  You are scheduled for a Cardiac Catheterization on Monday, March 4 with Dr. Quay Burow.  1. Please arrive at the [facility] (Main Entrance A) at [facility]: [address] [place], [place] [address] at 1:00 PM (two hours before your procedure to ensure your preparation). Free valet parking service is available.   Special note: Every effort is made to have your procedure done on time. Please understand that emergencies sometimes delay scheduled procedures.  2. Diet: Do not eat or drink anything after midnight prior to your procedure except sips of water to take medications.  3. Labs: Please have the labs drawn at the [facility] office.  4. Medication instructions in preparation for your procedure:   On the morning of your procedure, take your Aspirin and any morning medicines NOT listed above.  You may use sips of water.  5. Plan for one night stay--bring personal belongings. 6. Bring a current list of your medications and current insurance cards. 7. You MUST have a responsible person to drive you home. 8. Someone MUST be with you the first 24 hours after you arrive home or your discharge will be delayed. 9. Please wear clothes that are easy to get on and off and wear slip-on shoes.  Thank you for allowing [place] to care for you!   -- [facility]

## 2017-07-26 NOTE — Assessment & Plan Note (Signed)
Robert Adams returns today for follow-up of his noninvasive test previous 2-D echo was essentially normal although his Myoview stress test showed inferior scar. Based on this I decided to proceed with outpatient radial diagnostic coronary angiography.The patient understands that risks included but are not limited to stroke (1 in 1000), death (1 in 21), kidney failure [usually temporary] (1 in 500), bleeding (1 in 200), allergic reaction [possibly serious] (1 in 200). The patient understands and agrees to proceed

## 2017-07-26 NOTE — H&P (View-Only) (Signed)
07/26/2017 TANYA MARVIN   [birth year]  [id number]  Primary Physician Hayden Rasmussen, MD Primary Cardiologist: Lorretta Harp MD [name], [place]  HPI:  Robert Adams is a 72 y.o.   thin-appearing married Caucasian male father of 2, grandfather of 4 grandchildren who is retired from being a Higher education careers adviser. He was referred by Dr. Darron Doom for cardiovascular evaluation because of shortness of breath. I last saw him in the office 06/12/17. His risk factors include 50 pack years having quit 7 years ago. He apparently has a brother who had stenting. He has no other risk factors. He's never had a heart attack or stroke. He has noticed increasing dyspnea on exertion in the last 3-4 months.   He had a Myoview stress test that showed inferior scar and an 2-D echo that was essentially normal. Based on this I decided to proceed with outpatient diagnostic coronary angiography.     Current Meds  Medication Sig  . albuterol (PROVENTIL HFA;VENTOLIN HFA) 108 (90 Base) MCG/ACT inhaler Inhale into the lungs every 6 (six) hours as needed for wheezing or shortness of breath.  . budesonide-formoterol (SYMBICORT) 160-4.5 MCG/ACT inhaler Take 2 puffs first thing in am and then another 2 puffs about 12 hours later.  Marland Kitchen CINNAMON PO Take 1 tablet by mouth.  . clonazePAM (KLONOPIN) 1 MG tablet Take 1 tablet by mouth at bedtime as needed.  . finasteride (PROSCAR) 5 MG tablet Take 5 mg by mouth every evening.    Marland Kitchen losartan (COZAAR) 25 MG tablet at bedtime.  Marland Kitchen MAGNESIUM PO Take 1 tablet by mouth daily.  . Multiple Vitamins-Minerals (PRESERVISION AREDS PO) Take 1 tablet by mouth daily.  . Tamsulosin HCl (FLOMAX) 0.4 MG CAPS Take by mouth Nightly.       No Known Allergies  Social History   Socioeconomic History  . Marital status: Married    Spouse name: Not on file  . Number of children: Not on file  . Years of education: Not on file  . Highest education level: Not on file  Social Needs  .  Financial resource strain: Not on file  . Food insecurity - worry: Not on file  . Food insecurity - inability: Not on file  . Transportation needs - medical: Not on file  . Transportation needs - non-medical: Not on file  Occupational History  . Not on file  Tobacco Use  . Smoking status: Former Smoker    Packs/day: 1.00    Years: 30.00    Pack years: 30.00    Last attempt to quit: 06/04/2010    Years since quitting: 7.1  . Smokeless tobacco: Never Used  Substance and Sexual Activity  . Alcohol use: No  . Drug use: No  . Sexual activity: Not on file  Other Topics Concern  . Not on file  Social History Narrative  . Not on file     Review of Systems: General: negative for chills, fever, night sweats or weight changes.  Cardiovascular: negative for chest pain, dyspnea on exertion, edema, orthopnea, palpitations, paroxysmal nocturnal dyspnea or shortness of breath Dermatological: negative for rash Respiratory: negative for cough or wheezing Urologic: negative for hematuria Abdominal: negative for nausea, vomiting, diarrhea, bright red blood per rectum, melena, or hematemesis Neurologic: negative for visual changes, syncope, or dizziness All other systems reviewed and are otherwise negative except as noted above.    Blood pressure 111/73, pulse 90, height 5' 10.5" (1.791 m), weight 167 lb 3.2  oz (75.8 kg).  General appearance: alert and no distress Neck: no adenopathy, no carotid bruit, no JVD, supple, symmetrical, trachea midline and thyroid not enlarged, symmetric, no tenderness/mass/nodules Lungs: clear to auscultation bilaterally Heart: regular rate and rhythm, S1, S2 normal, no murmur, click, rub or gallop Extremities: extremities normal, atraumatic, no cyanosis or edema Pulses: 2+ and symmetric Skin: Skin color, texture, turgor normal. No rashes or lesions Neurologic: Alert and oriented X 3, normal strength and tone. Normal symmetric reflexes. Normal coordination and  gait  EKG not performed today  ASSESSMENT AND PLAN:   Dyspnea on exertion Mr. Rebello returns today for follow-up of his noninvasive test previous 2-D echo was essentially normal although his Myoview stress test showed inferior scar. Based on this I decided to proceed with outpatient radial diagnostic coronary angiography.The patient understands that risks included but are not limited to stroke (1 in 1000), death (1 in 19), kidney failure [usually temporary] (1 in 500), bleeding (1 in 200), allergic reaction [possibly serious] (1 in 200). The patient understands and agrees to proceed      Lorretta Harp MD [facility], [facility] 07/26/2017 11:03 AM

## 2017-07-29 ENCOUNTER — Telehealth: Payer: Self-pay | Admitting: Cardiovascular Disease

## 2017-07-29 DIAGNOSIS — H401131 Primary open-angle glaucoma, bilateral, mild stage: Secondary | ICD-10-CM | POA: Diagnosis not present

## 2017-07-29 DIAGNOSIS — F411 Generalized anxiety disorder: Secondary | ICD-10-CM | POA: Diagnosis not present

## 2017-07-29 DIAGNOSIS — I2510 Atherosclerotic heart disease of native coronary artery without angina pectoris: Secondary | ICD-10-CM | POA: Diagnosis not present

## 2017-07-29 DIAGNOSIS — N400 Enlarged prostate without lower urinary tract symptoms: Secondary | ICD-10-CM | POA: Diagnosis not present

## 2017-07-29 DIAGNOSIS — K219 Gastro-esophageal reflux disease without esophagitis: Secondary | ICD-10-CM | POA: Diagnosis not present

## 2017-07-29 DIAGNOSIS — Z9181 History of falling: Secondary | ICD-10-CM | POA: Diagnosis not present

## 2017-07-29 DIAGNOSIS — J449 Chronic obstructive pulmonary disease, unspecified: Secondary | ICD-10-CM | POA: Diagnosis not present

## 2017-07-29 DIAGNOSIS — Z87891 Personal history of nicotine dependence: Secondary | ICD-10-CM | POA: Diagnosis not present

## 2017-07-29 DIAGNOSIS — I700 Atherosclerosis of aorta: Secondary | ICD-10-CM | POA: Diagnosis not present

## 2017-07-29 DIAGNOSIS — I10 Essential (primary) hypertension: Secondary | ICD-10-CM | POA: Diagnosis not present

## 2017-07-29 DIAGNOSIS — M1990 Unspecified osteoarthritis, unspecified site: Secondary | ICD-10-CM | POA: Diagnosis not present

## 2017-07-29 NOTE — Telephone Encounter (Signed)
Robert Adams is calling because he is having a Cath on Monday and there are a few things he wants to talk about before he has it. (1) He had a cath in 2003 while in [place] . And nothing was found and If he finds anything this time will he go ahead and fix it right then ? Please call on cell [phone].  Thanks

## 2017-07-29 NOTE — Telephone Encounter (Signed)
The patient was calling to inform Dr. Gwenlyn Found that he had a cardiac cath in 2003 in [place]. He had forgotten to mention this at his appointment.  The patient also had a few questions about the procedure which has been answered to his understanding.

## 2017-08-01 ENCOUNTER — Telehealth: Payer: Self-pay | Admitting: *Deleted

## 2017-08-01 DIAGNOSIS — F411 Generalized anxiety disorder: Secondary | ICD-10-CM | POA: Diagnosis not present

## 2017-08-01 DIAGNOSIS — I10 Essential (primary) hypertension: Secondary | ICD-10-CM | POA: Diagnosis not present

## 2017-08-01 DIAGNOSIS — J449 Chronic obstructive pulmonary disease, unspecified: Secondary | ICD-10-CM | POA: Diagnosis not present

## 2017-08-01 DIAGNOSIS — N400 Enlarged prostate without lower urinary tract symptoms: Secondary | ICD-10-CM | POA: Diagnosis not present

## 2017-08-01 DIAGNOSIS — I2510 Atherosclerotic heart disease of native coronary artery without angina pectoris: Secondary | ICD-10-CM | POA: Diagnosis not present

## 2017-08-01 DIAGNOSIS — I700 Atherosclerosis of aorta: Secondary | ICD-10-CM | POA: Diagnosis not present

## 2017-08-01 NOTE — Telephone Encounter (Signed)
I spoke with patient's wife ,Jocelyn Lamer [facility]), discussed instructions for cath, she verbalized understanding.

## 2017-08-01 NOTE — Telephone Encounter (Signed)
Catheterization scheduled at [facility] for: Monday March 4,2019 3 PM Arrival time and place: [facility] Main Entrance A/North Tower at: 1PM Nothing to eat after midnight prior to cath,  liquids until 7:30 AM, then nothing to eat or drink except medications with sips of water.   AM meds can be  taken pre-cath with sip of water including: ASA 81 mg AM of cath  Patient has responsible person to drive home post procedure and observe patient for 24 hours  LMTCB for pt to discuss instructions

## 2017-08-05 ENCOUNTER — Ambulatory Visit (HOSPITAL_COMMUNITY)
Admission: RE | Admit: 2017-08-05 | Discharge: 2017-08-05 | Disposition: A | Discharge status: Home / Self Care | Payer: Medicare Other | Source: Ambulatory Visit | Attending: Cardiovascular Disease | Admitting: Cardiovascular Disease

## 2017-08-05 ENCOUNTER — Encounter (HOSPITAL_COMMUNITY)
Admission: RE | Disposition: A | Discharge status: Home / Self Care | Payer: Self-pay | Source: Ambulatory Visit | Attending: Cardiovascular Disease

## 2017-08-05 DIAGNOSIS — Z87891 Personal history of nicotine dependence: Secondary | ICD-10-CM | POA: Insufficient documentation

## 2017-08-05 DIAGNOSIS — R0602 Shortness of breath: Secondary | ICD-10-CM | POA: Diagnosis not present

## 2017-08-05 DIAGNOSIS — R9439 Abnormal result of other cardiovascular function study: Secondary | ICD-10-CM | POA: Insufficient documentation

## 2017-08-05 DIAGNOSIS — Z7951 Long term (current) use of inhaled steroids: Secondary | ICD-10-CM | POA: Diagnosis not present

## 2017-08-05 DIAGNOSIS — R0609 Other forms of dyspnea: Secondary | ICD-10-CM | POA: Diagnosis present

## 2017-08-05 HISTORY — PX: LEFT HEART CATH AND CORONARY ANGIOGRAPHY: CATH118249

## 2017-08-05 SURGERY — LEFT HEART CATH AND CORONARY ANGIOGRAPHY
Anesthesia: LOCAL

## 2017-08-05 MED ORDER — HEPARIN (PORCINE) IN NACL 2-0.9 UNIT/ML-% IJ SOLN
INTRAMUSCULAR | Status: AC | PRN
  Administered 2017-08-05 (×2): 500 mL via INTRA_ARTERIAL

## 2017-08-05 MED ORDER — SODIUM CHLORIDE 0.9% FLUSH: 3.0000 mL | INTRAVENOUS | Status: DC | PRN

## 2017-08-05 MED ORDER — VERAPAMIL HCL 2.5 MG/ML IV SOLN
INTRAVENOUS | Status: AC
  Filled 2017-08-05: qty 2

## 2017-08-05 MED ORDER — SODIUM CHLORIDE 0.9 % IV SOLN: 250.0000 mL | INTRAVENOUS | Status: DC | PRN

## 2017-08-05 MED ORDER — ASPIRIN 81 MG PO CHEW: 81.0000 mg | CHEWABLE_TABLET | ORAL | Status: AC

## 2017-08-05 MED ORDER — SODIUM CHLORIDE 0.9 % WEIGHT BASED INFUSION: 1.0000 mL/kg/h | INTRAVENOUS | Status: DC

## 2017-08-05 MED ORDER — HEPARIN SODIUM (PORCINE) 1000 UNIT/ML IJ SOLN
INTRAMUSCULAR | Status: AC
  Filled 2017-08-05: qty 1

## 2017-08-05 MED ORDER — HEPARIN SODIUM (PORCINE) 1000 UNIT/ML IJ SOLN
INTRAMUSCULAR | Status: DC | PRN
  Administered 2017-08-05: 3500 [IU] via INTRAVENOUS

## 2017-08-05 MED ORDER — LIDOCAINE HCL (PF) 1 % IJ SOLN
INTRAMUSCULAR | Status: DC | PRN
  Administered 2017-08-05: 2 mL

## 2017-08-05 MED ORDER — SODIUM CHLORIDE 0.9 % WEIGHT BASED INFUSION
3.0000 mL/kg/h | INTRAVENOUS | Status: AC
  Administered 2017-08-05: 3 mL/kg/h via INTRAVENOUS

## 2017-08-05 MED ORDER — VERAPAMIL HCL 2.5 MG/ML IV SOLN
INTRA_ARTERIAL | Status: DC | PRN
  Administered 2017-08-05: 15:00:00 via INTRA_ARTERIAL

## 2017-08-05 MED ORDER — NITROGLYCERIN 1 MG/10 ML FOR IR/CATH LAB
INTRA_ARTERIAL | Status: AC
  Filled 2017-08-05: qty 10

## 2017-08-05 MED ORDER — ONDANSETRON HCL 4 MG/2ML IJ SOLN: 4.0000 mg | Freq: Four times a day (QID) | INTRAMUSCULAR | Status: DC | PRN

## 2017-08-05 MED ORDER — ASPIRIN 81 MG PO CHEW: 81.0000 mg | CHEWABLE_TABLET | Freq: Every day | ORAL | Status: DC

## 2017-08-05 MED ORDER — IOPAMIDOL (ISOVUE-370) INJECTION 76%
INTRAVENOUS | Status: AC
  Filled 2017-08-05: qty 100

## 2017-08-05 MED ORDER — SODIUM CHLORIDE 0.9 % IV SOLN: INTRAVENOUS | Status: AC

## 2017-08-05 MED ORDER — SODIUM CHLORIDE 0.9% FLUSH: 3.0000 mL | Freq: Two times a day (BID) | INTRAVENOUS | Status: DC

## 2017-08-05 MED ORDER — ACETAMINOPHEN 325 MG PO TABS: 650.0000 mg | ORAL_TABLET | ORAL | Status: DC | PRN

## 2017-08-05 MED ORDER — LIDOCAINE HCL (PF) 1 % IJ SOLN
INTRAMUSCULAR | Status: AC
  Filled 2017-08-05: qty 30

## 2017-08-05 MED ORDER — IOPAMIDOL (ISOVUE-370) INJECTION 76%
INTRAVENOUS | Status: DC | PRN
  Administered 2017-08-05: 65 mL via INTRA_ARTERIAL

## 2017-08-05 MED ORDER — HEPARIN (PORCINE) IN NACL 2-0.9 UNIT/ML-% IJ SOLN
INTRAMUSCULAR | Status: AC
  Filled 2017-08-05: qty 1000

## 2017-08-05 SURGICAL SUPPLY — 12 items
CATH INFINITI 5FR ANG PIGTAIL (CATHETERS) ×1 IMPLANT
CATH OPTITORQUE TIG 4.0 5F (CATHETERS) ×1 IMPLANT
DEVICE RAD COMP TR BAND LRG (VASCULAR PRODUCTS) ×1 IMPLANT
GLIDESHEATH SLEND A-KIT 6F 22G (SHEATH) ×1 IMPLANT
GUIDEWIRE INQWIRE 1.5J.035X260 (WIRE) IMPLANT
INQWIRE 1.5J .035X260CM (WIRE) ×2
KIT HEART LEFT (KITS) ×2 IMPLANT
PACK CARDIAC CATHETERIZATION (CUSTOM PROCEDURE TRAY) ×2 IMPLANT
SYR MEDRAD MARK V 150ML (SYRINGE) ×2 IMPLANT
TRANSDUCER W/STOPCOCK (MISCELLANEOUS) ×2 IMPLANT
TUBING CIL FLEX 10 FLL-RA (TUBING) ×2 IMPLANT
WIRE HI TORQ VERSACORE-J 145CM (WIRE) ×1 IMPLANT

## 2017-08-05 NOTE — Interval H&P Note (Signed)
Cath Lab Visit (complete for each Cath Lab visit)  Clinical Evaluation Leading to the Procedure:   ACS: Yes.    Non-ACS:    Anginal Classification: CCS II  Anti-ischemic medical therapy: No Therapy  Non-Invasive Test Results: Intermediate-risk stress test findings: cardiac mortality 1-3%/year  Prior CABG: No previous CABG      History and Physical Interval Note:  08/05/2017 2:13 PM  Arthor Captain Marchetta  has presented today for surgery, with the diagnosis of abnormal stress test  The various methods of treatment have been discussed with the patient and family. After consideration of risks, benefits and other options for treatment, the patient has consented to  Procedure(s): LEFT HEART CATH AND CORONARY ANGIOGRAPHY (N/A) as a surgical intervention .  The patient's history has been reviewed, patient examined, no change in status, stable for surgery.  I have reviewed the patient's chart and labs.  Questions were answered to the patient's satisfaction.     Quay Burow

## 2017-08-05 NOTE — Research (Signed)
CADFEM Informed Consent   Subject Name: Robert Adams  Subject met inclusion and exclusion criteria.  The informed consent form, study requirements and expectations were reviewed with the subject and questions and concerns were addressed prior to the signing of the consent form.  The subject verbalized understanding of the trail requirements.  The subject agreed to participate in the CADFEM trial and signed the informed consent.  The informed consent was obtained prior to performance of any protocol-specific procedures for the subject.  A copy of the signed informed consent was given to the subject and a copy was placed in the subject's medical record.  Neva Seat 08/05/2017, 2:00 PM

## 2017-08-05 NOTE — Discharge Instructions (Signed)
**Note Kylian Loh-identified via Obfuscation** Radial Site Care °Refer to this sheet in the next few weeks. These instructions provide you with information about caring for yourself after your procedure. Your health care provider may also give you more specific instructions. Your treatment has been planned according to current medical practices, but problems sometimes occur. Call your health care provider if you have any problems or questions after your procedure. °What can I expect after the procedure? °After your procedure, it is typical to have the following: °· Bruising at the radial site that usually fades within 1-2 weeks. °· Blood collecting in the tissue (hematoma) that may be painful to the touch. It should usually decrease in size and tenderness within 1-2 weeks. ° °Follow these instructions at home: °· Take medicines only as directed by your health care provider. °· You may shower 24-48 hours after the procedure or as directed by your health care provider. Remove the bandage (dressing) and gently wash the site with plain soap and water. Pat the area dry with a clean towel. Do not rub the site, because this may cause bleeding. °· Do not take baths, swim, or use a hot tub until your health care provider approves. °· Check your insertion site every day for redness, swelling, or drainage. °· Do not apply powder or lotion to the site. °· Do not flex or bend the affected arm for 24 hours or as directed by your health care provider. °· Do not push or pull heavy objects with the affected arm for 24 hours or as directed by your health care provider. °· Do not lift over 10 lb (4.5 kg) for 5 days after your procedure or as directed by your health care provider. °· Ask your health care provider when it is okay to: °? Return to work or school. °? Resume usual physical activities or sports. °? Resume sexual activity. °· Do not drive home if you are discharged the same day as the procedure. Have someone else drive you. °· You may drive 24 hours after the procedure  unless otherwise instructed by your health care provider. °· Do not operate machinery or power tools for 24 hours after the procedure. °· If your procedure was done as an outpatient procedure, which means that you went home the same day as your procedure, a responsible adult should be with you for the first 24 hours after you arrive home. °· Keep all follow-up visits as directed by your health care provider. This is important. °Contact a health care provider if: °· You have a fever. °· You have chills. °· You have increased bleeding from the radial site. Hold pressure on the site. °Get help right away if: °· You have unusual pain at the radial site. °· You have redness, warmth, or swelling at the radial site. °· You have drainage (other than a small amount of blood on the dressing) from the radial site. °· The radial site is bleeding, and the bleeding does not stop after 30 minutes of holding steady pressure on the site. °· Your arm or hand becomes pale, cool, tingly, or numb. °This information is not intended to replace advice given to you by your health care provider. Make sure you discuss any questions you have with your health care provider. °Document Released: 06/23/2010 Document Revised: 10/27/2015 Document Reviewed: 12/07/2013 °Elsevier Interactive Patient Education © 2018 [employer] ° °

## 2017-08-06 ENCOUNTER — Encounter (HOSPITAL_COMMUNITY): Payer: Self-pay | Admitting: Cardiovascular Disease

## 2017-08-06 DIAGNOSIS — R9439 Abnormal result of other cardiovascular function study: Secondary | ICD-10-CM | POA: Diagnosis not present

## 2017-08-06 DIAGNOSIS — R0602 Shortness of breath: Secondary | ICD-10-CM | POA: Diagnosis not present

## 2017-08-06 DIAGNOSIS — Z87891 Personal history of nicotine dependence: Secondary | ICD-10-CM | POA: Diagnosis not present

## 2017-08-06 DIAGNOSIS — Z7951 Long term (current) use of inhaled steroids: Secondary | ICD-10-CM | POA: Diagnosis not present

## 2017-08-07 DIAGNOSIS — F411 Generalized anxiety disorder: Secondary | ICD-10-CM | POA: Diagnosis not present

## 2017-08-07 DIAGNOSIS — I2510 Atherosclerotic heart disease of native coronary artery without angina pectoris: Secondary | ICD-10-CM | POA: Diagnosis not present

## 2017-08-07 DIAGNOSIS — I10 Essential (primary) hypertension: Secondary | ICD-10-CM | POA: Diagnosis not present

## 2017-08-07 DIAGNOSIS — I700 Atherosclerosis of aorta: Secondary | ICD-10-CM | POA: Diagnosis not present

## 2017-08-07 DIAGNOSIS — N400 Enlarged prostate without lower urinary tract symptoms: Secondary | ICD-10-CM | POA: Diagnosis not present

## 2017-08-07 DIAGNOSIS — J449 Chronic obstructive pulmonary disease, unspecified: Secondary | ICD-10-CM | POA: Diagnosis not present

## 2017-08-16 DIAGNOSIS — F411 Generalized anxiety disorder: Secondary | ICD-10-CM | POA: Diagnosis not present

## 2017-08-16 DIAGNOSIS — J449 Chronic obstructive pulmonary disease, unspecified: Secondary | ICD-10-CM | POA: Diagnosis not present

## 2017-08-16 DIAGNOSIS — L700 Acne vulgaris: Secondary | ICD-10-CM | POA: Diagnosis not present

## 2017-08-16 DIAGNOSIS — L989 Disorder of the skin and subcutaneous tissue, unspecified: Secondary | ICD-10-CM | POA: Diagnosis not present

## 2017-08-16 DIAGNOSIS — Z7189 Other specified counseling: Secondary | ICD-10-CM | POA: Diagnosis not present

## 2017-08-16 DIAGNOSIS — L570 Actinic keratosis: Secondary | ICD-10-CM | POA: Diagnosis not present

## 2017-08-21 ENCOUNTER — Telehealth: Payer: Self-pay | Admitting: Cardiovascular Disease

## 2017-08-21 NOTE — Telephone Encounter (Signed)
Called patient and let him know that he is scheduled to see Dr. Gwenlyn Found tomorrow at 10:45.

## 2017-08-22 ENCOUNTER — Ambulatory Visit: Payer: Medicare Other | Admitting: Cardiovascular Disease

## 2017-08-23 ENCOUNTER — Encounter: Payer: Self-pay | Admitting: Cardiovascular Disease

## 2017-08-23 ENCOUNTER — Ambulatory Visit (INDEPENDENT_AMBULATORY_CARE_PROVIDER_SITE_OTHER): Payer: Medicare Other | Admitting: Cardiovascular Disease

## 2017-08-23 VITALS — BP 118/71 | HR 83 | Ht 70.5 in | Wt 168.0 lb

## 2017-08-23 DIAGNOSIS — Z13220 Encounter for screening for lipoid disorders: Secondary | ICD-10-CM | POA: Diagnosis not present

## 2017-08-23 DIAGNOSIS — R0609 Other forms of dyspnea: Secondary | ICD-10-CM | POA: Diagnosis not present

## 2017-08-23 DIAGNOSIS — I10 Essential (primary) hypertension: Secondary | ICD-10-CM | POA: Diagnosis not present

## 2017-08-23 LAB — LIPID PANEL
CHOLESTEROL TOTAL: 166 mg/dL (ref 100–199)
Chol/HDL Ratio: 2.8 ratio (ref 0.0–5.0)
HDL: 60 mg/dL (ref >39)
LDL CALC: 92 mg/dL (ref 0–99)
TRIGLYCERIDES: 68 mg/dL (ref 0–149)
VLDL CHOLESTEROL CAL: 14 mg/dL (ref 5–40)

## 2017-08-23 LAB — HEPATIC FUNCTION PANEL
ALBUMIN: 4.2 g/dL (ref 3.5–4.8)
ALT: 15 IU/L (ref 0–44)
AST: 20 IU/L (ref 0–40)
Alkaline Phosphatase: 75 IU/L (ref 39–117)
BILIRUBIN, DIRECT: 0.15 mg/dL (ref 0.00–0.40)
Bilirubin Total: 0.5 mg/dL (ref 0.0–1.2)
Total Protein: 6.3 g/dL (ref 6.0–8.5)

## 2017-08-23 NOTE — Assessment & Plan Note (Signed)
History of essential hypertension blood pressure measures 118/71.He is on losartan. Continue current meds at current dosing.

## 2017-08-23 NOTE — Progress Notes (Signed)
08/23/2017 Robert Adams   01/13/1946  [id number]  Primary Physician Hayden Rasmussen, MD Primary Cardiologist: Lorretta Harp MD [name], [place]  HPI:  Robert Adams is a 72 y.o.  thin-appearing married Caucasian male father of 2, grandfather of 4 grandchildren who is retired from being a Higher education careers adviser. He was referred by Dr. Darron Doom for cardiovascular evaluation because of shortness of breath. I last saw him in the office 07/26/17. His risk factors include 50 pack years having quit 7 years ago. He apparently has a brother who had stenting. He has no other risk factors. He's never had a heart attack or stroke. He has noticed increasing dyspnea on exertion in the last 3-4 months.  He had a Myoview stress test that showed inferior scar and an 2-D echo that was essentially normal. Based on this I decided to proceed with outpatient diagnostic coronary angiography on 08/05/17 which was entirely normal.     Current Meds  Medication Sig  . albuterol (PROVENTIL HFA;VENTOLIN HFA) 108 (90 Base) MCG/ACT inhaler Inhale 2 puffs into the lungs every 6 (six) hours as needed for wheezing or shortness of breath.   . budesonide-formoterol (SYMBICORT) 160-4.5 MCG/ACT inhaler Take 2 puffs first thing in am and then another 2 puffs about 12 hours later. (Patient taking differently: Inhale 2 puffs into the lungs at bedtime. )  . CINNAMON PO Take 1 capsule by mouth daily.   . clonazePAM (KLONOPIN) 1 MG tablet Take 1 mg by mouth at bedtime.   . finasteride (PROSCAR) 5 MG tablet Take 5 mg by mouth every evening.    . latanoprost (XALATAN) 0.005 % ophthalmic solution Place 1 drop into both eyes at bedtime.  Marland Kitchen losartan (COZAAR) 25 MG tablet Take 25 mg by mouth at bedtime.   . Multiple Vitamins-Minerals (PRESERVISION AREDS 2 PO) Take 1 capsule by mouth daily.  . Tamsulosin HCl (FLOMAX) 0.4 MG CAPS Take 0.4 mg by mouth at bedtime.      No Known Allergies  Social History   Socioeconomic  History  . Marital status: Married    Spouse name: Not on file  . Number of children: Not on file  . Years of education: Not on file  . Highest education level: Not on file  Occupational History  . Not on file  Social Needs  . Financial resource strain: Not on file  . Food insecurity:    Worry: Not on file    Inability: Not on file  . Transportation needs:    Medical: Not on file    Non-medical: Not on file  Tobacco Use  . Smoking status: Former Smoker    Packs/day: 1.00    Years: 30.00    Pack years: 30.00    Last attempt to quit: 06/04/2010    Years since quitting: 7.2  . Smokeless tobacco: Never Used  Substance and Sexual Activity  . Alcohol use: No  . Drug use: No  . Sexual activity: Not on file  Lifestyle  . Physical activity:    Days per week: Not on file    Minutes per session: Not on file  . Stress: Not on file  Relationships  . Social connections:    Talks on phone: Not on file    Gets together: Not on file    Attends religious service: Not on file    Active member of club or organization: Not on file    Attends meetings of clubs or organizations: Not  on file    Relationship status: Not on file  . Intimate partner violence:    Fear of current or ex partner: Not on file    Emotionally abused: Not on file    Physically abused: Not on file    Forced sexual activity: Not on file  Other Topics Concern  . Not on file  Social History Narrative  . Not on file     Review of Systems: General: negative for chills, fever, night sweats or weight changes.  Cardiovascular: negative for chest pain, dyspnea on exertion, edema, orthopnea, palpitations, paroxysmal nocturnal dyspnea or shortness of breath Dermatological: negative for rash Respiratory: negative for cough or wheezing Urologic: negative for hematuria Abdominal: negative for nausea, vomiting, diarrhea, bright red blood per rectum, melena, or hematemesis Neurologic: negative for visual changes, syncope, or  dizziness All other systems reviewed and are otherwise negative except as noted above.    Blood pressure 118/71, pulse 83, height 5' 10.5" (1.791 m), weight 168 lb (76.2 kg).  General appearance: alert and no distress Neck: no adenopathy, no carotid bruit, no JVD, supple, symmetrical, trachea midline and thyroid not enlarged, symmetric, no tenderness/mass/nodules Lungs: clear to auscultation bilaterally Heart: regular rate and rhythm, S1, S2 normal, no murmur, click, rub or gallop Extremities: extremities normal, atraumatic, no cyanosis or edema Pulses: 2+ and symmetric Skin: Skin color, texture, turgor normal. No rashes or lesions Neurologic: Alert and oriented X 3, normal strength and tone. Normal symmetric reflexes. Normal coordination and gait  EKG not performed today  ASSESSMENT AND PLAN:   Essential hypertension History of essential hypertension blood pressure measures 118/71.He is on losartan. Continue current meds at current dosing.  Dyspnea on exertion History of dyspnea on exertion with an abnormal Myoview suggesting inferior scar. I performed an outpatient radial diagnostic cath on him 08/05/17 revealing normal coronary arteries and normal LV function. His stress test was false positive. 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 No further cardiac workup is indicated at this time.      Lorretta Harp MD FACP,FACC,FAHA, [facility] 08/23/2017 9:51 AM

## 2017-08-23 NOTE — Patient Instructions (Signed)
Medication Instructions: Your physician recommends that you continue on your current medications as directed. Please refer to the Current Medication list given to you today.  Labwork: Your physician recommends that you return for a FASTING lipid profile and hepatic function panel at your earliest convenience.   Follow-Up: Your physician recommends that you schedule a follow-up appointment as needed with Dr. Gwenlyn Found.

## 2017-08-23 NOTE — Assessment & Plan Note (Signed)
History of dyspnea on exertion with an abnormal Myoview suggesting inferior scar. I performed an outpatient radial diagnostic cath on him 08/05/17 revealing normal coronary arteries and normal LV function. His stress test was false positive. 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 No further cardiac workup is indicated at this time.

## 2017-08-24 DIAGNOSIS — J449 Chronic obstructive pulmonary disease, unspecified: Secondary | ICD-10-CM | POA: Diagnosis not present

## 2017-08-24 DIAGNOSIS — I10 Essential (primary) hypertension: Secondary | ICD-10-CM | POA: Diagnosis not present

## 2017-08-24 DIAGNOSIS — I700 Atherosclerosis of aorta: Secondary | ICD-10-CM | POA: Diagnosis not present

## 2017-08-24 DIAGNOSIS — N400 Enlarged prostate without lower urinary tract symptoms: Secondary | ICD-10-CM | POA: Diagnosis not present

## 2017-08-24 DIAGNOSIS — I2510 Atherosclerotic heart disease of native coronary artery without angina pectoris: Secondary | ICD-10-CM | POA: Diagnosis not present

## 2017-08-24 DIAGNOSIS — F411 Generalized anxiety disorder: Secondary | ICD-10-CM | POA: Diagnosis not present

## 2017-11-01 DIAGNOSIS — H401131 Primary open-angle glaucoma, bilateral, mild stage: Secondary | ICD-10-CM | POA: Diagnosis not present

## 2017-11-01 DIAGNOSIS — H25013 Cortical age-related cataract, bilateral: Secondary | ICD-10-CM | POA: Diagnosis not present

## 2017-11-01 DIAGNOSIS — H2513 Age-related nuclear cataract, bilateral: Secondary | ICD-10-CM | POA: Diagnosis not present

## 2017-12-18 DIAGNOSIS — E785 Hyperlipidemia, unspecified: Secondary | ICD-10-CM | POA: Diagnosis not present

## 2017-12-18 DIAGNOSIS — E039 Hypothyroidism, unspecified: Secondary | ICD-10-CM | POA: Diagnosis not present

## 2017-12-18 DIAGNOSIS — J449 Chronic obstructive pulmonary disease, unspecified: Secondary | ICD-10-CM | POA: Diagnosis not present

## 2017-12-18 DIAGNOSIS — G64 Other disorders of peripheral nervous system: Secondary | ICD-10-CM | POA: Diagnosis not present

## 2017-12-18 DIAGNOSIS — D519 Vitamin B12 deficiency anemia, unspecified: Secondary | ICD-10-CM | POA: Diagnosis not present

## 2017-12-18 DIAGNOSIS — M545 Low back pain: Secondary | ICD-10-CM | POA: Diagnosis not present

## 2017-12-18 DIAGNOSIS — E559 Vitamin D deficiency, unspecified: Secondary | ICD-10-CM | POA: Diagnosis not present

## 2017-12-18 DIAGNOSIS — F411 Generalized anxiety disorder: Secondary | ICD-10-CM | POA: Diagnosis not present

## 2018-01-08 ENCOUNTER — Ambulatory Visit: Payer: Medicare Other | Admitting: Family Medicine

## 2018-03-24 DIAGNOSIS — Z125 Encounter for screening for malignant neoplasm of prostate: Secondary | ICD-10-CM | POA: Diagnosis not present

## 2018-03-24 DIAGNOSIS — E785 Hyperlipidemia, unspecified: Secondary | ICD-10-CM | POA: Diagnosis not present

## 2018-03-24 DIAGNOSIS — E559 Vitamin D deficiency, unspecified: Secondary | ICD-10-CM | POA: Diagnosis not present

## 2018-03-26 DIAGNOSIS — F411 Generalized anxiety disorder: Secondary | ICD-10-CM | POA: Diagnosis not present

## 2018-03-26 DIAGNOSIS — I10 Essential (primary) hypertension: Secondary | ICD-10-CM | POA: Diagnosis not present

## 2018-03-26 DIAGNOSIS — Z23 Encounter for immunization: Secondary | ICD-10-CM | POA: Diagnosis not present

## 2018-03-26 DIAGNOSIS — M545 Low back pain: Secondary | ICD-10-CM | POA: Diagnosis not present

## 2018-03-26 DIAGNOSIS — G4700 Insomnia, unspecified: Secondary | ICD-10-CM | POA: Diagnosis not present

## 2018-04-14 ENCOUNTER — Ambulatory Visit
Admission: RE | Admit: 2018-04-14 | Discharge: 2018-04-14 | Disposition: A | Discharge status: Home / Self Care | Payer: Medicare Other | Source: Ambulatory Visit | Attending: Family Medicine | Admitting: Family Medicine

## 2018-04-14 ENCOUNTER — Other Ambulatory Visit: Payer: Self-pay | Admitting: Family Medicine

## 2018-04-14 DIAGNOSIS — M2569 Stiffness of other specified joint, not elsewhere classified: Secondary | ICD-10-CM

## 2018-04-14 DIAGNOSIS — R52 Pain, unspecified: Secondary | ICD-10-CM

## 2018-04-14 DIAGNOSIS — M2560 Stiffness of unspecified joint, not elsewhere classified: Principal | ICD-10-CM

## 2018-04-14 DIAGNOSIS — Z7689 Persons encountering health services in other specified circumstances: Secondary | ICD-10-CM | POA: Diagnosis not present

## 2018-04-14 DIAGNOSIS — J398 Other specified diseases of upper respiratory tract: Secondary | ICD-10-CM | POA: Diagnosis not present

## 2018-04-14 DIAGNOSIS — M545 Low back pain: Secondary | ICD-10-CM | POA: Diagnosis not present

## 2018-04-14 DIAGNOSIS — J449 Chronic obstructive pulmonary disease, unspecified: Secondary | ICD-10-CM | POA: Diagnosis not present

## 2018-04-22 ENCOUNTER — Other Ambulatory Visit: Payer: Self-pay | Admitting: Family Medicine

## 2018-04-22 DIAGNOSIS — N200 Calculus of kidney: Secondary | ICD-10-CM

## 2018-04-23 DIAGNOSIS — R0989 Other specified symptoms and signs involving the circulatory and respiratory systems: Secondary | ICD-10-CM | POA: Insufficient documentation

## 2018-04-23 DIAGNOSIS — K219 Gastro-esophageal reflux disease without esophagitis: Secondary | ICD-10-CM | POA: Insufficient documentation

## 2018-04-23 DIAGNOSIS — Z1211 Encounter for screening for malignant neoplasm of colon: Secondary | ICD-10-CM | POA: Diagnosis not present

## 2018-04-23 DIAGNOSIS — Z1212 Encounter for screening for malignant neoplasm of rectum: Secondary | ICD-10-CM | POA: Diagnosis not present

## 2018-04-23 DIAGNOSIS — J392 Other diseases of pharynx: Secondary | ICD-10-CM | POA: Diagnosis not present

## 2018-04-25 ENCOUNTER — Ambulatory Visit
Admission: RE | Admit: 2018-04-25 | Discharge: 2018-04-25 | Disposition: A | Discharge status: Home / Self Care | Payer: Medicare Other | Source: Ambulatory Visit | Attending: Family Medicine | Admitting: Family Medicine

## 2018-04-25 DIAGNOSIS — N200 Calculus of kidney: Secondary | ICD-10-CM

## 2018-05-05 ENCOUNTER — Other Ambulatory Visit: Payer: Self-pay | Admitting: Family Medicine

## 2018-05-05 DIAGNOSIS — N289 Disorder of kidney and ureter, unspecified: Secondary | ICD-10-CM

## 2018-05-16 ENCOUNTER — Ambulatory Visit (HOSPITAL_COMMUNITY)
Admission: RE | Admit: 2018-05-16 | Discharge: 2018-05-16 | Disposition: A | Discharge status: Home / Self Care | Payer: Medicare Other | Source: Ambulatory Visit | Attending: Family Medicine | Admitting: Family Medicine

## 2018-05-16 DIAGNOSIS — N289 Disorder of kidney and ureter, unspecified: Secondary | ICD-10-CM | POA: Diagnosis not present

## 2018-05-16 DIAGNOSIS — K7689 Other specified diseases of liver: Secondary | ICD-10-CM | POA: Insufficient documentation

## 2018-05-16 DIAGNOSIS — N2889 Other specified disorders of kidney and ureter: Secondary | ICD-10-CM | POA: Diagnosis not present

## 2018-05-16 LAB — CREATININE, SERUM
Creatinine, Ser: 0.97 mg/dL (ref 0.61–1.24)
GFR calc non Af Amer: >60 mL/min (ref >60)

## 2018-05-16 MED ORDER — GADOBUTROL 1 MMOL/ML IV SOLN
7.5000 mL | Freq: Once | INTRAVENOUS | Status: AC | PRN
  Administered 2018-05-16: 7.5 mL via INTRAVENOUS

## 2018-07-23 ENCOUNTER — Ambulatory Visit
Admission: RE | Admit: 2018-07-23 | Discharge: 2018-07-23 | Disposition: A | Discharge status: Home / Self Care | Payer: Medicare Other | Source: Ambulatory Visit | Attending: Family Medicine | Admitting: Family Medicine

## 2018-07-23 ENCOUNTER — Other Ambulatory Visit: Payer: Self-pay | Admitting: Family Medicine

## 2018-07-23 DIAGNOSIS — R1032 Left lower quadrant pain: Secondary | ICD-10-CM

## 2018-07-23 MED ORDER — IOPAMIDOL (ISOVUE-300) INJECTION 61%
100.0000 mL | Freq: Once | INTRAVENOUS | Status: AC | PRN
  Administered 2018-07-23: 100 mL via INTRAVENOUS

## 2018-12-02 IMAGING — DX DG LUMBAR SPINE COMPLETE 4+V
5 series · 5 of 5 positions shown · non-contrast
Comparison: 08/19/2015

CLINICAL DATA: Constant low back pain

EXAM:
LUMBAR SPINE - COMPLETE 4+ VIEW

[dg lumbar spine complete 4 +v (1 of 5)]
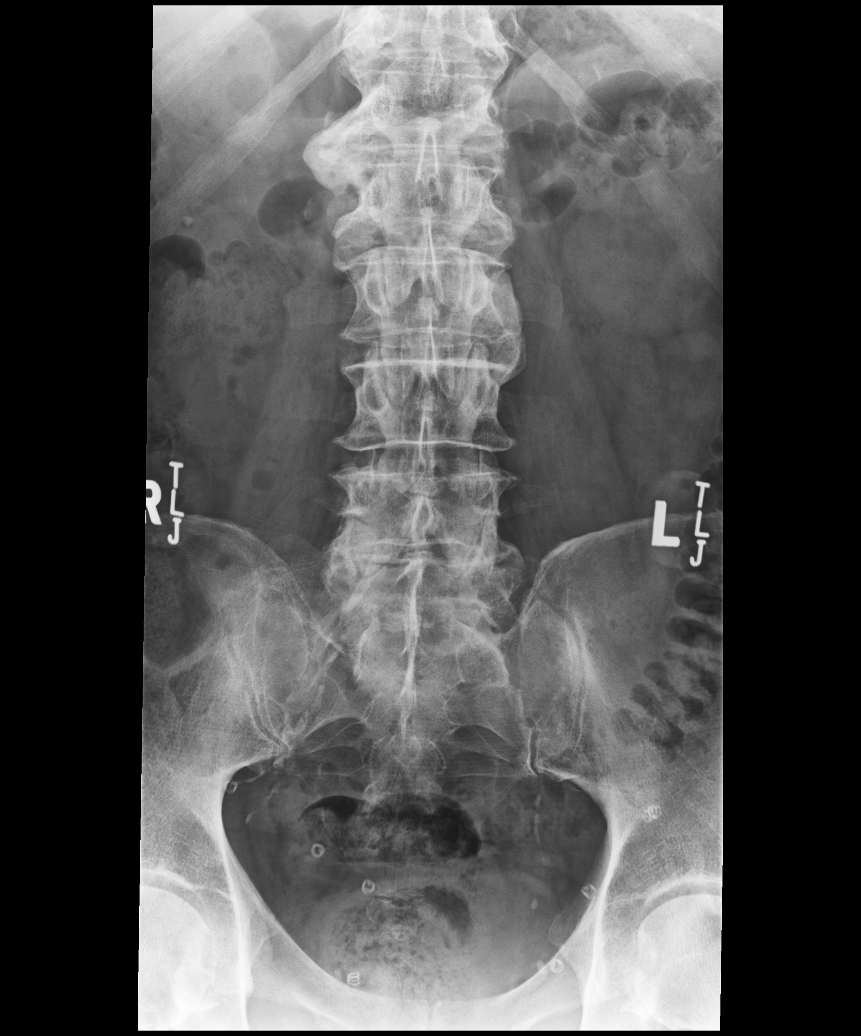

[dg lumbar spine complete 4 +v (2 of 5)]
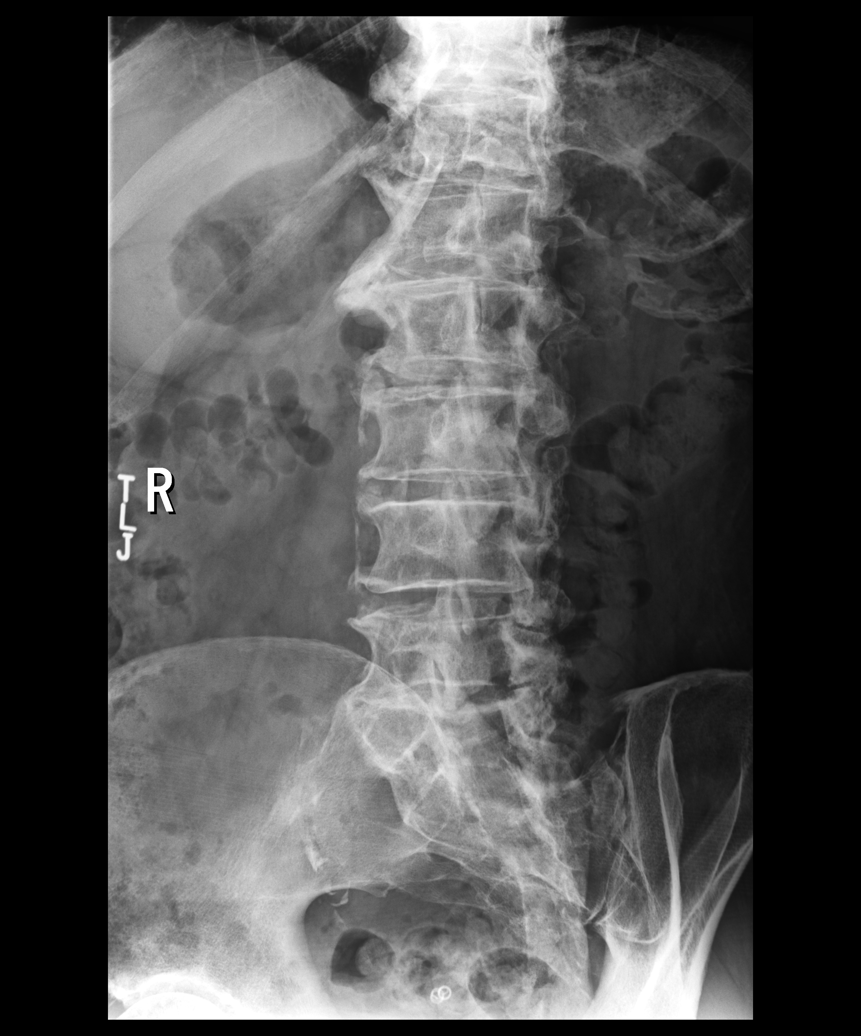

[dg lumbar spine complete 4 +v (3 of 5)]
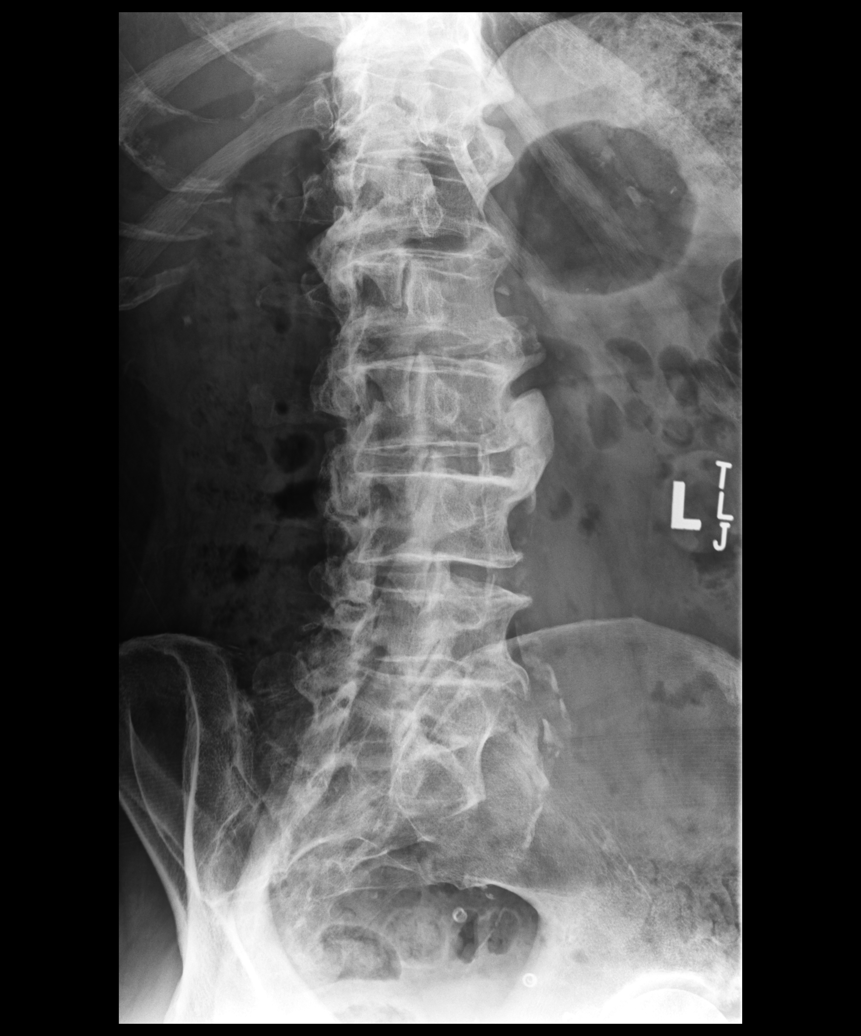

[dg lumbar spine complete 4 +v (4 of 5)]
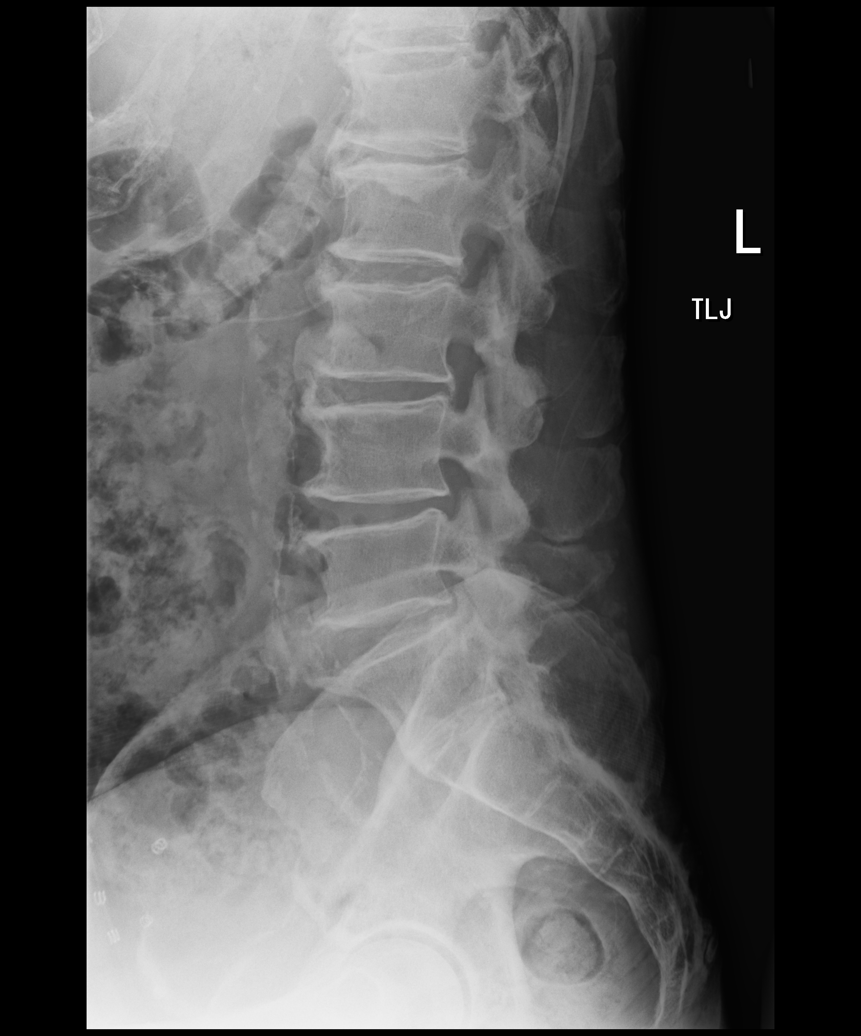

[dg lumbar spine complete 4 +v (5 of 5)]
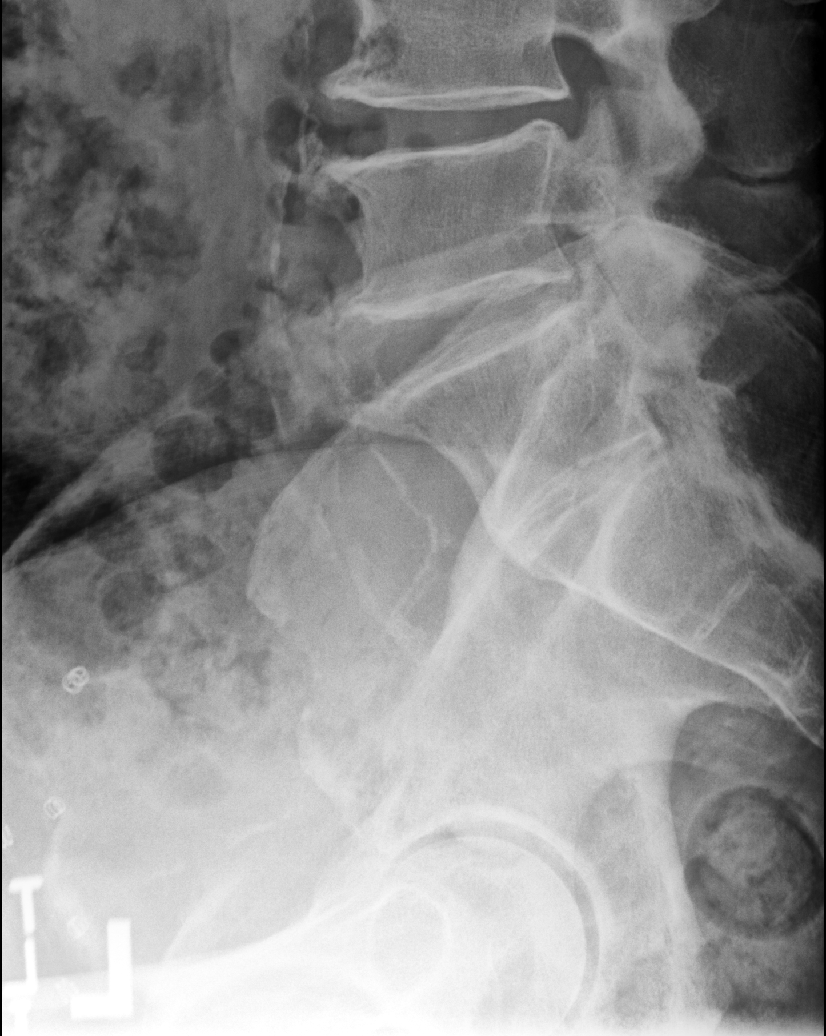

[5 of 5 positions shown; findings below may reference images not displayed]

FINDINGS: Suspected transitional anatomy with lumbarized S1 segment. Vertebral
body heights are normal. Alignment within normal limits. Mild
diffuse degenerative changes of the lumbar spine with disc space
narrowing and anterior osteophyte. Posterior facet degenerative
change L3 through the upper sacrum. Aortic atherosclerosis. Prior
hernia repair at the pelvis. Suspected bilateral kidney stones.
IMPRESSION: 1. No acute osseous abnormality.
2. Mild diffuse degenerative changes of the lumbar spine.
3. Probable bilateral kidney stones.

## 2019-01-28 ENCOUNTER — Other Ambulatory Visit: Payer: Self-pay

## 2019-01-28 DIAGNOSIS — Z20822 Contact with and (suspected) exposure to covid-19: Secondary | ICD-10-CM

## 2019-01-29 LAB — SPECIMEN STATUS REPORT

## 2019-01-29 LAB — NOVEL CORONAVIRUS, NAA: SARS-CoV-2, NAA: NOT DETECTED

## 2019-02-04 ENCOUNTER — Other Ambulatory Visit: Payer: Self-pay

## 2019-02-04 DIAGNOSIS — Z20822 Contact with and (suspected) exposure to covid-19: Secondary | ICD-10-CM

## 2019-02-05 LAB — NOVEL CORONAVIRUS, NAA: SARS-CoV-2, NAA: DETECTED — AB

## 2019-10-04 ENCOUNTER — Encounter (HOSPITAL_COMMUNITY): Payer: Self-pay | Admitting: Emergency Medicine

## 2019-10-04 ENCOUNTER — Emergency Department (HOSPITAL_COMMUNITY)
Admission: EM | Admit: 2019-10-04 | Discharge: 2019-10-04 | Disposition: A | Discharge status: Home / Self Care | Payer: Medicare Other | Attending: Emergency Medicine | Admitting: Emergency Medicine

## 2019-10-04 ENCOUNTER — Other Ambulatory Visit: Payer: Self-pay

## 2019-10-04 DIAGNOSIS — I951 Orthostatic hypotension: Secondary | ICD-10-CM | POA: Insufficient documentation

## 2019-10-04 DIAGNOSIS — Z79899 Other long term (current) drug therapy: Secondary | ICD-10-CM | POA: Diagnosis not present

## 2019-10-04 DIAGNOSIS — Z87891 Personal history of nicotine dependence: Secondary | ICD-10-CM | POA: Insufficient documentation

## 2019-10-04 DIAGNOSIS — I10 Essential (primary) hypertension: Secondary | ICD-10-CM | POA: Diagnosis not present

## 2019-10-04 DIAGNOSIS — J449 Chronic obstructive pulmonary disease, unspecified: Secondary | ICD-10-CM | POA: Insufficient documentation

## 2019-10-04 DIAGNOSIS — R42 Dizziness and giddiness: Secondary | ICD-10-CM

## 2019-10-04 LAB — BASIC METABOLIC PANEL
Anion gap: 8 (ref 5–15)
BUN: 16 mg/dL (ref 8–23)
CO2: 27 mmol/L (ref 22–32)
Calcium: 9.3 mg/dL (ref 8.9–10.3)
Chloride: 106 mmol/L (ref 98–111)
Creatinine, Ser: 0.95 mg/dL (ref 0.61–1.24)
GFR calc Af Amer: >60 mL/min (ref >60)
GFR calc non Af Amer: >60 mL/min (ref >60)
Glucose, Bld: 89 mg/dL (ref 70–99)
Potassium: 4.5 mmol/L (ref 3.5–5.1)
Sodium: 141 mmol/L (ref 135–145)

## 2019-10-04 LAB — CBC WITH DIFFERENTIAL/PLATELET
Abs Immature Granulocytes: 0.03 10*3/uL (ref 0.00–0.07)
Basophils Absolute: 0 10*3/uL (ref 0.0–0.1)
Basophils Relative: 1 %
Eosinophils Absolute: 0.2 10*3/uL (ref 0.0–0.5)
Eosinophils Relative: 2 %
HCT: 46.9 % (ref 39.0–52.0)
Hemoglobin: 15.5 g/dL (ref 13.0–17.0)
Immature Granulocytes: 0 %
Lymphocytes Relative: 12 %
Lymphs Abs: 1 10*3/uL (ref 0.7–4.0)
MCH: 29.6 pg (ref 26.0–34.0)
MCHC: 33 g/dL (ref 30.0–36.0)
MCV: 89.7 fL (ref 80.0–100.0)
Monocytes Absolute: 0.6 10*3/uL (ref 0.1–1.0)
Monocytes Relative: 8 %
Neutro Abs: 6.5 10*3/uL (ref 1.7–7.7)
Neutrophils Relative %: 77 %
Platelets: 391 10*3/uL (ref 150–400)
RBC: 5.23 MIL/uL (ref 4.22–5.81)
RDW: 13.4 % (ref 11.5–15.5)
WBC: 8.4 10*3/uL (ref 4.0–10.5)
nRBC: 0 % (ref 0.0–0.2)

## 2019-10-04 MED ORDER — SODIUM CHLORIDE 0.9 % IV BOLUS
1000.0000 mL | Freq: Once | INTRAVENOUS | Status: AC
  Administered 2019-10-04: 1000 mL via INTRAVENOUS

## 2019-10-04 NOTE — ED Provider Notes (Signed)
[facility] DEPT Provider Note   CSN: [id number] Arrival date & time: 10/04/19  1204     History Chief Complaint  Patient presents with  . Dizziness    Robert Adams is a 74 y.o. male.  74 yo M with a chief complaints of feeling unsteady.  This is been going on for about 5 days.  He states that he played a round of golf and was gardening the day before.  He since then has been fairly fatigued.  Usually first thing in the morning when he wakes up he feels more off balance and it takes him some time to feel better.  Denies any dizziness when he quickly turns his head or moves his eyes.  Has a mild tightening feeling to his head but denies headache.  Denies recent head injury or loss of consciousness.  Denies neck pain or chest pain or abdominal pain.  Feels he is been eating and drinking normally denies nausea vomiting or diarrhea denies dark stool or blood in stool.  He saw his doctor yesterday who had blood drawn.  They told him that he may need imaging if he did not improve.  Had another episode of dizziness this morning when he woke up.  Feels a bit better now but came to the ED for evaluation.  The history is provided by the patient.  Dizziness Quality:  Lightheadedness Severity:  Moderate Onset quality:  Gradual Duration:  5 days Timing:  Constant Progression:  Waxing and waning Chronicity:  New Context: standing up   Relieved by:  Nothing Worsened by:  Nothing Ineffective treatments:  None tried Associated symptoms: no chest pain, no diarrhea, no headaches, no palpitations, no shortness of breath and no vomiting        Past Medical History:  Diagnosis Date  . Arthritis   . BPH (benign prostatic hyperplasia)   . COPD (chronic obstructive pulmonary disease) ([place])   . GERD (gastroesophageal reflux disease)     Patient Active Problem List   Diagnosis Date Noted  . Essential hypertension 08/23/2017  . Dyspnea on exertion 06/12/2017  . COPD  GOLD III 12/27/2016    Past Surgical History:  Procedure Laterality Date  . APPENDECTOMY    . Arthroscopic surgery     both knees  . COLONOSCOPY    . HERNIA REPAIR     bilat ing   . KNEE ARTHROSCOPY     rt and lt  . LEFT HEART CATH AND CORONARY ANGIOGRAPHY N/A 08/05/2017   Procedure: LEFT HEART CATH AND CORONARY ANGIOGRAPHY;  Surgeon: Lorretta Harp, MD;  Location: [facility];  Service: Cardiovascular;  Laterality: N/A;  . Right Shoulder Surgery         Family History  Problem Relation Age of Onset  . Emphysema Father        smoked  . Rheum arthritis Mother   . Heart disease Sister   . Heart disease Brother     Social History   Tobacco Use  . Smoking status: Former Smoker    Packs/day: 1.00    Years: 30.00    Pack years: 30.00    Quit date: 06/04/2010    Years since quitting: 9.3  . Smokeless tobacco: Never Used  Substance Use Topics  . Alcohol use: No  . Drug use: No    Home Medications Prior to Admission medications   Medication Sig Start Date End Date Taking? Authorizing Provider  albuterol (PROVENTIL HFA;VENTOLIN HFA) 108 (90  Base) MCG/ACT inhaler Inhale 2 puffs into the lungs every 6 (six) hours as needed for wheezing or shortness of breath.     [provider]  budesonide-formoterol (SYMBICORT) 160-4.5 MCG/ACT inhaler Take 2 puffs first thing in am and then another 2 puffs about 12 hours later. Patient taking differently: Inhale 2 puffs into the lungs at bedtime.  12/27/16   Tanda Rockers, MD  CINNAMON PO Take 1 capsule by mouth daily.     [provider]  clonazePAM (KLONOPIN) 1 MG tablet Take 1 mg by mouth at bedtime.  05/09/16   [provider]  finasteride (PROSCAR) 5 MG tablet Take 5 mg by mouth every evening.      [provider]  latanoprost (XALATAN) 0.005 % ophthalmic solution Place 1 drop into both eyes at bedtime.    [provider]  losartan (COZAAR) 25 MG tablet Take 25 mg by mouth at bedtime.   07/15/17   [provider]  Multiple Vitamins-Minerals (PRESERVISION AREDS 2 PO) Take 1 capsule by mouth daily.    [provider]  Tamsulosin HCl (FLOMAX) 0.4 MG CAPS Take 0.4 mg by mouth at bedtime.     [provider]    Allergies    Patient has no known allergies.  Review of Systems   Review of Systems  Constitutional: Negative for chills and fever.  HENT: Negative for congestion and facial swelling.   Eyes: Negative for discharge and visual disturbance.  Respiratory: Negative for shortness of breath.   Cardiovascular: Negative for chest pain and palpitations.  Gastrointestinal: Negative for abdominal pain, diarrhea and vomiting.  Musculoskeletal: Negative for arthralgias and myalgias.  Skin: Negative for color change and rash.  Neurological: Positive for dizziness. Negative for tremors, syncope and headaches.  Psychiatric/Behavioral: Negative for confusion and dysphoric mood.    Physical Exam Updated Vital Signs BP (!) 153/70   Pulse 79   Temp 98.1 F (36.7 C) (Oral)   Resp 17   SpO2 97%   Physical Exam Vitals and nursing note reviewed.  Constitutional:      Appearance: He is well-developed.  HENT:     Head: Normocephalic and atraumatic.  Eyes:     Pupils: Pupils are equal, round, and reactive to light.  Neck:     Vascular: No JVD.  Cardiovascular:     Rate and Rhythm: Normal rate and regular rhythm.     Heart sounds: No murmur. No friction rub. No gallop.   Pulmonary:     Effort: No respiratory distress.     Breath sounds: No wheezing.  Abdominal:     General: There is no distension.     Tenderness: There is no guarding or rebound.  Musculoskeletal:        General: Normal range of motion.     Cervical back: Normal range of motion and neck supple.  Skin:    Coloration: Skin is not pale.     Findings: No rash.  Neurological:     Mental Status: He is alert and oriented to person, place, and time.     GCS: GCS eye subscore is 4.  GCS verbal subscore is 5. GCS motor subscore is 6.     Cranial Nerves: Cranial nerves are intact.     Sensory: Sensation is intact.     Motor: Motor function is intact.     Coordination: Coordination is intact.     Deep Tendon Reflexes: Babinski sign absent on the right side. Babinski sign absent  on the left side.     Comments: Benign neurologic exam  Psychiatric:        Behavior: Behavior normal.     ED Results / Procedures / Treatments   Labs (all labs ordered are listed, but only abnormal results are displayed) Labs Reviewed  CBC WITH DIFFERENTIAL/PLATELET  BASIC METABOLIC PANEL    EKG None  Radiology No results found.  Procedures Procedures (including critical care time)  Medications Ordered in ED Medications  sodium chloride 0.9 % bolus 1,000 mL (1,000 mLs Intravenous New Bag/Given 10/04/19 1340)    ED Course  I have reviewed the triage vital signs and the nursing notes.  Pertinent labs & imaging results that were available during my care of the patient were reviewed by me and considered in my medical decision making (see chart for details).    MDM Rules/Calculators/A&P                      74 yo M with a chief complaints of lightheadedness.  This occurs when he stands up.  Worse first thing in the morning.  This could be orthostasis.  We will give a bolus of IV fluids.  Patient is describing more of a unsteadiness than a vertiginous-like syndrome.  Denies recent URI or congestion.  No obvious signs of URI on exam.  More concerning for possible central cause.  Discussed MRI with the patient.  Patient feeling much better after bolus of IV fluids.  Able to ambulate around the room and feels much less unsteady.  He would like to forego getting an MRI at this time.  States he will call his doctor tomorrow and discuss the visit with them.  Return for worsening symptoms.  2:59 PM:  I have discussed the diagnosis/risks/treatment options with the patient and family and  believe the pt to be eligible for discharge home to follow-up with PCP. We also discussed returning to the ED immediately if new or worsening sx occur. We discussed the sx which are most concerning (e.g., one sided numbness or weakness, difficulty with speech or swallowing) that necessitate immediate return. Medications administered to the patient during their visit and any new prescriptions provided to the patient are listed below.  Medications given during this visit Medications  sodium chloride 0.9 % bolus 1,000 mL (1,000 mLs Intravenous New Bag/Given 10/04/19 1340)     The patient appears reasonably screen and/or stabilized for discharge and I doubt any other medical condition or other [facility] requiring further screening, evaluation, or treatment in the ED at this time prior to discharge.   Final Clinical Impression(s) / ED Diagnoses Final diagnoses:  Orthostasis  Dizziness    Rx / DC Orders ED Discharge Orders    None       Deno Etienne, DO 10/04/19 1459

## 2019-10-04 NOTE — ED Triage Notes (Signed)
Pt c/o dizziness since WED with ambulation.

## 2020-01-19 ENCOUNTER — Other Ambulatory Visit: Payer: Self-pay | Admitting: Family Medicine

## 2020-01-19 DIAGNOSIS — I701 Atherosclerosis of renal artery: Secondary | ICD-10-CM

## 2020-01-27 ENCOUNTER — Ambulatory Visit
Admission: RE | Admit: 2020-01-27 | Discharge: 2020-01-27 | Disposition: A | Discharge status: Home / Self Care | Payer: Medicare Other | Source: Ambulatory Visit | Attending: Family Medicine | Admitting: Family Medicine

## 2020-01-27 DIAGNOSIS — I701 Atherosclerosis of renal artery: Secondary | ICD-10-CM

## 2020-04-20 ENCOUNTER — Ambulatory Visit (INDEPENDENT_AMBULATORY_CARE_PROVIDER_SITE_OTHER): Payer: Medicare Other | Admitting: Otolaryngology

## 2020-05-11 ENCOUNTER — Other Ambulatory Visit: Payer: Self-pay

## 2020-05-11 ENCOUNTER — Encounter (INDEPENDENT_AMBULATORY_CARE_PROVIDER_SITE_OTHER): Payer: Self-pay | Admitting: Otolaryngology

## 2020-05-11 ENCOUNTER — Ambulatory Visit (INDEPENDENT_AMBULATORY_CARE_PROVIDER_SITE_OTHER): Payer: Medicare Other | Admitting: Otolaryngology

## 2020-05-11 VITALS — Temp 96.4°F

## 2020-05-11 DIAGNOSIS — K219 Gastro-esophageal reflux disease without esophagitis: Secondary | ICD-10-CM | POA: Diagnosis not present

## 2020-05-11 DIAGNOSIS — R490 Dysphonia: Secondary | ICD-10-CM | POA: Diagnosis not present

## 2020-05-11 DIAGNOSIS — I701 Atherosclerosis of renal artery: Secondary | ICD-10-CM

## 2020-05-11 NOTE — Progress Notes (Signed)
HPI: Robert Adams is a 74 y.o. male who returns today for evaluation of occasional hoarseness.  He also complains of occasionally getting choked on water and food when it goes down but this happens just intermittently.  His voice also tends to come and go at times.  He has previously been diagnosed with reflux disease but is not taking an acid medication at this point.  His voice is doing much better today and has no significant hoarseness today. He apparently had Covid 13 months ago..  Past Medical History:  Diagnosis Date  . Arthritis   . BPH (benign prostatic hyperplasia)   . COPD (chronic obstructive pulmonary disease) ([place])   . GERD (gastroesophageal reflux disease)    Past Surgical History:  Procedure Laterality Date  . APPENDECTOMY    . Arthroscopic surgery     both knees  . COLONOSCOPY    . HERNIA REPAIR     bilat ing   . KNEE ARTHROSCOPY     rt and lt  . LEFT HEART CATH AND CORONARY ANGIOGRAPHY N/A 08/05/2017   Procedure: LEFT HEART CATH AND CORONARY ANGIOGRAPHY;  Surgeon: Lorretta Harp, MD;  Location: [facility];  Service: Cardiovascular;  Laterality: N/A;  . Right Shoulder Surgery     Social History   Socioeconomic History  . Marital status: Married    Spouse name: Not on file  . Number of children: Not on file  . Years of education: Not on file  . Highest education level: Not on file  Occupational History  . Not on file  Tobacco Use  . Smoking status: Former Smoker    Packs/day: 1.00    Years: 30.00    Pack years: 30.00    Quit date: 06/04/2010    Years since quitting: 9.9  . Smokeless tobacco: Never Used  Substance and Sexual Activity  . Alcohol use: No  . Drug use: No  . Sexual activity: Not on file  Other Topics Concern  . Not on file  Social History Narrative  . Not on file   Social Determinants of Health   Financial Resource Strain:   . Difficulty of Paying Living Expenses: Not on file  Food Insecurity:   . Worried About Ship broker in the Last Year: Not on file  . Ran Out of Food in the Last Year: Not on file  Transportation Needs:   . Lack of Transportation (Medical): Not on file  . Lack of Transportation (Non-Medical): Not on file  Physical Activity:   . Days of Exercise per Week: Not on file  . Minutes of Exercise per Session: Not on file  Stress:   . Feeling of Stress : Not on file  Social Connections:   . Frequency of Communication with Friends and Family: Not on file  . Frequency of Social Gatherings with Friends and Family: Not on file  . Attends Religious Services: Not on file  . Active Member of Clubs or Organizations: Not on file  . Attends Archivist Meetings: Not on file  . Marital Status: Not on file   Family History  Problem Relation Age of Onset  . Emphysema Father        smoked  . Rheum arthritis Mother   . Heart disease Sister   . Heart disease Brother    No Known Allergies Prior to Admission medications   Medication Sig Start Date End Date Taking? Authorizing Provider  albuterol (PROVENTIL HFA;VENTOLIN HFA) 108 (90  Base) MCG/ACT inhaler Inhale 2 puffs into the lungs every 6 (six) hours as needed for wheezing or shortness of breath.    Yes [provider]  budesonide-formoterol (SYMBICORT) 160-4.5 MCG/ACT inhaler Take 2 puffs first thing in am and then another 2 puffs about 12 hours later. Patient taking differently: Inhale 2 puffs into the lungs at bedtime.  12/27/16  Yes Tanda Rockers, MD  CINNAMON PO Take 1 capsule by mouth daily.    Yes [provider]  clonazePAM (KLONOPIN) 1 MG tablet Take 1 mg by mouth at bedtime.  05/09/16  Yes [provider]  finasteride (PROSCAR) 5 MG tablet Take 5 mg by mouth every evening.     Yes [provider]  latanoprost (XALATAN) 0.005 % ophthalmic solution Place 1 drop into both eyes at bedtime.   Yes [provider]  losartan (COZAAR) 25 MG tablet Take 25 mg by mouth at bedtime.  07/15/17  Yes  [provider]  Multiple Vitamins-Minerals (PRESERVISION AREDS 2 PO) Take 1 capsule by mouth daily.   Yes [provider]  Tamsulosin HCl (FLOMAX) 0.4 MG CAPS Take 0.4 mg by mouth at bedtime.    Yes [provider]     Positive ROS: Otherwise negative  All other systems have been reviewed and were otherwise negative with the exception of those mentioned in the HPI and as above.  Physical Exam: Constitutional: Alert, well-appearing, no acute distress.  He has minimal hoarseness in the office today does have a little bit of throat clearing. Ears: External ears without lesions or tenderness. Ear canals are clear bilaterally with intact, clear TMs.  Nasal: External nose without lesions. Septum midline. Clear nasal passages bilaterally. Oral: Lips and gums without lesions. Tongue and palate mucosa without lesions. Posterior oropharynx clear. Fiberoptic laryngoscopy was performed through the right nostril.  The nasopharynx was clear.  The base of tongue vallecula epiglottis were normal.  Vocal cords were clear bilaterally with normal vocal mobility.  He had minimal arytenoid edema.  The fiberoptic laryngoscope was passed through the upper esophageal sphincter without difficulty and the upper cervical esophagus was clear with no inflammatory changes. Neck: No palpable adenopathy or masses Respiratory: Breathing comfortably  Skin: No facial/neck lesions or rash noted.  Laryngoscopy  Date/Time: 05/11/2020 11:24 AM Performed by: Rozetta Nunnery, MD Authorized by: Rozetta Nunnery, MD   Consent:    Consent obtained:  Verbal   Consent given by:  Patient Procedure details:    Indications: hoarseness, dysphagia, or aspiration     Medication:  Afrin   Instrument: flexible fiberoptic laryngoscope     Scope location: right nare   Sinus:    Right nasopharynx: normal   Mouth:    Oropharynx: normal     Vallecula: normal     Base of tongue: normal      Epiglottis: normal   Throat:    True vocal cords: normal   Comments:     On fiberoptic laryngoscopy vocal cords were clear bilaterally.  Past the laryngoscope through the upper esophageal sphincter without difficulty and the upper cervical esophagus was clear.  He had mild edema of the arytenoid mucosa.    Assessment: Hoarseness most likely related to laryngeal pharyngeal reflux disease.  No evidence of neoplasia or infection.  Plan: Suggested taking omeprazole 40 mg daily before dinner. He will follow-up if he has any persistent hoarseness or throat problems.  He is actually doing better today.   Radene Journey, MD

## 2020-05-31 ENCOUNTER — Telehealth: Payer: Self-pay

## 2020-05-31 NOTE — Telephone Encounter (Signed)
NOTES ON FILE FROM [facility] [phone] SENT REFERRAL TO SCHEDULING

## 2020-06-02 ENCOUNTER — Encounter: Payer: Self-pay | Admitting: Gastroenterology

## 2020-06-07 NOTE — Progress Notes (Unsigned)
Cardiology Clinic Note   Patient Name: Robert Adams Date of Encounter: 06/08/2020  Primary Care Provider:  Dois Davenport, MD Primary Cardiologist:  Nanetta Batty, MD  Patient Profile    Robert Shadow. Adams 75 year old male presents the clinic today for an evaluation of his chest pain.  Past Medical History    Past Medical History:  Diagnosis Date  . Arthritis   . BPH (benign prostatic hyperplasia)   . COPD (chronic obstructive pulmonary disease) ([facility])   . GERD (gastroesophageal reflux disease)    Past Surgical History:  Procedure Laterality Date  . APPENDECTOMY    . Arthroscopic surgery     both knees  . COLONOSCOPY    . HERNIA REPAIR     bilat ing   . KNEE ARTHROSCOPY     rt and lt  . LEFT HEART CATH AND CORONARY ANGIOGRAPHY N/A 08/05/2017   Procedure: LEFT HEART CATH AND CORONARY ANGIOGRAPHY;  Surgeon: Runell Gess, MD;  Location: [facility];  Service: Cardiovascular;  Laterality: N/A;  . Right Shoulder Surgery      Allergies  No Known Allergies  History of Present Illness    Mr. [name] has a PMH of essential hypertension, COPD, and dyspnea on exertion.  He was initially referred by Dr. Nadyne Coombes for cardiovascular evaluation due to his shortness of breath.  He was seen in the office last by Dr. Allyson Sabal on 08/23/2017.  His risk factors included 15 pack years of smoking which she quit 9 years ago.  He reported a brother who had cardiac stenting.  He denied previous MI or CVA.  He reported that his dyspnea had increased over the last 3-4 months.  He underwent nuclear stress test that showed inferior scar and an echocardiogram that showed normal LV function.  He underwent outpatient diagnostic coronary angiography 08/05/2017 which showed normal coronary arteries.  He presents to the clinic today for follow-up evaluation states he feels well.  2 weeks ago he had 1 episode of chest pressure while he was cooking his breakfast.  It resolved with rest and he  said no further episodes of chest discomfort.  He presented to his PCP who advised him to see cardiology.  He remains very physically active golfing 2 times per week doing housework, and yard work.  He has been having some issues with his stomach.  He has been taking prednisone for loose stools which is helped and he has an appointment scheduled to see gastroenterology.  I will give him the salty 6 diet sheet, have him maintain his physical activity, and follow-up as needed.  Today he denies chest pain, shortness of breath, lower extremity edema, fatigue, palpitations, melena, hematuria, hemoptysis, diaphoresis, weakness, presyncope, syncope, orthopnea, and PND.   Home Medications    Prior to Admission medications   Medication Sig Start Date End Date Taking? Authorizing Provider  albuterol (PROVENTIL HFA;VENTOLIN HFA) 108 (90 Base) MCG/ACT inhaler Inhale 2 puffs into the lungs every 6 (six) hours as needed for wheezing or shortness of breath.     [provider]  budesonide-formoterol (SYMBICORT) 160-4.5 MCG/ACT inhaler Take 2 puffs first thing in am and then another 2 puffs about 12 hours later. Patient taking differently: Inhale 2 puffs into the lungs at bedtime.  12/27/16   Nyoka Cowden, MD  CINNAMON PO Take 1 capsule by mouth daily.     [provider]  clonazePAM (KLONOPIN) 1 MG tablet Take 1 mg by mouth at bedtime.  05/09/16   [provider]  finasteride (PROSCAR) 5 MG tablet Take 5 mg by mouth every evening.      [provider]  latanoprost (XALATAN) 0.005 % ophthalmic solution Place 1 drop into both eyes at bedtime.    [provider]  losartan (COZAAR) 25 MG tablet Take 25 mg by mouth at bedtime.  07/15/17   [provider]  Multiple Vitamins-Minerals (PRESERVISION AREDS 2 PO) Take 1 capsule by mouth daily.    [provider]  Tamsulosin HCl (FLOMAX) 0.4 MG CAPS Take 0.4 mg by mouth at bedtime.     [provider]     Family History    Family History  Problem Relation Age of Onset  . Emphysema Father        smoked  . Rheum arthritis Mother   . Heart disease Sister   . Heart disease Brother    He indicated that the status of his mother is unknown. He indicated that the status of his father is unknown. He indicated that the status of his sister is unknown. He indicated that the status of his brother is unknown.  Social History    Social History   Socioeconomic History  . Marital status: Married    Spouse name: Not on file  . Number of children: Not on file  . Years of education: Not on file  . Highest education level: Not on file  Occupational History  . Not on file  Tobacco Use  . Smoking status: Former Smoker    Packs/day: 1.00    Years: 30.00    Pack years: 30.00    Quit date: 06/04/2010    Years since quitting: 10.0  . Smokeless tobacco: Never Used  Substance and Sexual Activity  . Alcohol use: No  . Drug use: No  . Sexual activity: Not on file  Other Topics Concern  . Not on file  Social History Narrative  . Not on file   Social Determinants of Health   Financial Resource Strain: Not on file  Food Insecurity: Not on file  Transportation Needs: Not on file  Physical Activity: Not on file  Stress: Not on file  Social Connections: Not on file  Intimate Partner Violence: Not on file     Review of Systems    General:  No chills, fever, night sweats or weight changes.  Cardiovascular:  No chest pain, dyspnea on exertion, edema, orthopnea, palpitations, paroxysmal nocturnal dyspnea. Dermatological: No rash, lesions/masses Respiratory: No cough, dyspnea Urologic: No hematuria, dysuria Abdominal:   No nausea, vomiting, diarrhea, bright red blood per rectum, melena, or hematemesis Neurologic:  No visual changes, wkns, changes in mental status. All other systems reviewed and are otherwise negative except as noted above.  Physical Exam    VS:  BP 130/79 (BP Location:  Left Arm, Patient Position: Sitting, Cuff Size: Normal)   Pulse (!) 57   Ht 5\' 11"  (1.803 m)   Wt 166 lb (75.3 kg)   SpO2 93%   BMI 23.15 kg/m  , BMI Body mass index is 23.15 kg/m. GEN: Well nourished, well developed, in no acute distress. HEENT: normal. Neck: Supple, no JVD, carotid bruits, or masses. Cardiac: RRR, no murmurs, rubs, or gallops. No clubbing, cyanosis, edema.  Radials/DP/PT 2+ and equal bilaterally.  Respiratory:  Respirations regular and unlabored, clear to auscultation bilaterally. GI: Soft, nontender, nondistended, BS + x 4. MS: no deformity or atrophy. Skin: warm and dry, no rash. Neuro:  Strength and  sensation are intact. Psych: Normal affect.  Accessory Clinical Findings    Recent Labs: 10/04/2019: BUN 16; Creatinine, Ser 0.95; Hemoglobin 15.5; Platelets 391; Potassium 4.5; Sodium 141   Recent Lipid Panel    Component Value Date/Time   CHOL 166 08/23/2017 0958   TRIG 68 08/23/2017 0958   HDL 60 08/23/2017 0958   CHOLHDL 2.8 08/23/2017 0958   LDLCALC 92 08/23/2017 0958    ECG personally reviewed by me today-sinus bradycardia possible left atrial enlargement left axis deviation incomplete right bundle branch block anterior infarct undetermined age.  Echocardiogram 06/24/2017 Study Conclusions   - Left ventricle: The cavity size was normal. Wall thickness was  normal. Systolic function was normal. The estimated ejection  fraction was in the range of 50% to 55%. Doppler parameters are  consistent with abnormal left ventricular relaxation (grade 1  diastolic dysfunction).  Cardiac catheterization 08/05/2017  The left ventricular systolic function is normal.  LV end diastolic pressure is normal.  The left ventricular ejection fraction is 55-65% by visual estimate.   SIRJAMES NIM is a 75 y.o. male Diagnostic Dominance: Right    Intervention    Assessment & Plan   1.  Chest pain-no chest pain today.  Had one episode of chest pain 2  weeks ago.  EKG today shows sinus bradycardia left atrial enlargement left axis deviation incomplete right bundle branch block anterior septal infarct undetermined age.  Underwent cardiac catheterization 08/05/2017 that showed entirely normal coronary anatomy.  Echocardiogram showed normal LV function and G1 DD. Continue losartan Heart healthy low-sodium diet-salty 6 given Increase physical activity as tolerated  Essential hypertension-BP today 130/79.  Well-controlled at home Continue losartan Heart healthy low-sodium diet-salty 6 given Increase physical activity as tolerated  Dyspnea on exertion-no increased work of breathing.  Previously felt that his dyspnea was multifactorial related to his tobacco use and career as a Building control surveyor.  Patient also has COPD.  No further ischemic work-up with pursued Continue albuterol, Symbicort, Follows with PCP  Disposition: Follow-up with me or Dr. Gwenlyn Found PRN.  Jossie Ng. Bufford Helms NP-C    06/08/2020, 3:41 PM [facility] [address] Office [phone] Fax [phone]  Notice: This dictation was prepared with Dragon dictation along with smaller phrase technology. Any transcriptional errors that result from this process are unintentional and may not be corrected upon review.

## 2020-06-08 ENCOUNTER — Other Ambulatory Visit: Payer: Self-pay

## 2020-06-08 ENCOUNTER — Encounter: Payer: Self-pay | Admitting: General Practice

## 2020-06-08 ENCOUNTER — Ambulatory Visit (INDEPENDENT_AMBULATORY_CARE_PROVIDER_SITE_OTHER): Payer: PPO | Admitting: General Practice

## 2020-06-08 VITALS — BP 130/79 | HR 57 | Ht 71.0 in | Wt 166.0 lb

## 2020-06-08 DIAGNOSIS — R0600 Dyspnea, unspecified: Secondary | ICD-10-CM

## 2020-06-08 DIAGNOSIS — R079 Chest pain, unspecified: Secondary | ICD-10-CM

## 2020-06-08 DIAGNOSIS — I200 Unstable angina: Secondary | ICD-10-CM | POA: Diagnosis not present

## 2020-06-08 DIAGNOSIS — I10 Essential (primary) hypertension: Secondary | ICD-10-CM

## 2020-06-08 DIAGNOSIS — F411 Generalized anxiety disorder: Secondary | ICD-10-CM | POA: Diagnosis not present

## 2020-06-08 DIAGNOSIS — K52831 Collagenous colitis: Secondary | ICD-10-CM | POA: Diagnosis not present

## 2020-06-08 DIAGNOSIS — R197 Diarrhea, unspecified: Secondary | ICD-10-CM | POA: Diagnosis not present

## 2020-06-08 DIAGNOSIS — M519 Unspecified thoracic, thoracolumbar and lumbosacral intervertebral disc disorder: Secondary | ICD-10-CM | POA: Diagnosis not present

## 2020-06-08 DIAGNOSIS — I2510 Atherosclerotic heart disease of native coronary artery without angina pectoris: Secondary | ICD-10-CM | POA: Diagnosis not present

## 2020-06-08 DIAGNOSIS — J439 Emphysema, unspecified: Secondary | ICD-10-CM | POA: Diagnosis not present

## 2020-06-08 DIAGNOSIS — R0609 Other forms of dyspnea: Secondary | ICD-10-CM

## 2020-06-08 NOTE — Patient Instructions (Signed)
Medication Instructions:  The current medical regimen is effective;  continue present plan and medications as directed. Please refer to the Current Medication list given to you today.  *If you need a refill on your cardiac medications before your next appointment, please call your pharmacy*  Lab Work:   Testing/Procedures:  none    none  Special Instructions PLEASE READ AND FOLLOW SALTY 6-ATTACHED-1,800mg  daily  PLEASE MAINTAIN PHYSICAL ACTIVITY AS TOLERATED  Follow-Up: Your next appointment:  AS NEEDED  with Nanetta Batty, MD   At [facility], you and your health needs are our priority.  As part of our continuing mission to provide you with exceptional heart care, we have created designated Provider Care Teams.  These Care Teams include your primary Cardiologist (physician) and Advanced Practice Providers (APPs -  Physician Assistants and Nurse Practitioners) who all work together to provide you with the care you need, when you need it.            6 SALTY THINGS TO AVOID     1,800MG  DAILY

## 2020-06-23 ENCOUNTER — Encounter: Payer: Self-pay | Admitting: Gastroenterology

## 2020-06-23 ENCOUNTER — Ambulatory Visit (INDEPENDENT_AMBULATORY_CARE_PROVIDER_SITE_OTHER): Payer: PPO | Admitting: Gastroenterology

## 2020-06-23 VITALS — BP 136/64 | HR 65 | Ht 70.5 in | Wt 166.8 lb

## 2020-06-23 DIAGNOSIS — R197 Diarrhea, unspecified: Secondary | ICD-10-CM | POA: Diagnosis not present

## 2020-06-23 MED ORDER — NA SULFATE-K SULFATE-MG SULF 17.5-3.13-1.6 GM/177ML PO SOLN: 1.0000 | Freq: Once | ORAL | 0 refills | Status: AC

## 2020-06-23 NOTE — Progress Notes (Signed)
06/23/2020 Robert Adams [id number] 1946-02-29   HISTORY OF PRESENT ILLNESS: This is a pleasant 75 year old male who is new to our office.  Has been referred here by his PCP, Dr. Darron Doom, for evaluation diarrhea.  He tells me the diarrhea has been present for about the past 1.5 months.  He says that prior to that he had regular bowel movements.  He says he has never had any episodes in the past where he has had persistent diarrhea.  He says he was going about 4-5 times a day and was waking up some at night to move his bowels.  He denies really any abdominal pain.  Denies rectal bleeding.  He says that he had stool studies performed with the urgent care and by his PCP that were all negative.  He was empirically treated with antibiotics and he says that that did not help.  They then placed him on some low-dose prednisone, 10 mg daily, and the diarrhea resolves while he is taking that.  He says that he came off the prednisone and the diarrhea returned.  While he is on the prednisone he has about 2 bowel movements daily that are more formed.  He denies any nausea, vomiting, fevers.   Past Medical History:  Diagnosis Date  . Anxiety   . Arthritis   . BPH (benign prostatic hyperplasia)   . COPD (chronic obstructive pulmonary disease) ([place])   . Depression   . GERD (gastroesophageal reflux disease)   . Sleep apnea    Past Surgical History:  Procedure Laterality Date  . APPENDECTOMY    . COLONOSCOPY    . HERNIA REPAIR     bilat ing   . KNEE ARTHROSCOPY     rt and lt  . LEFT HEART CATH AND CORONARY ANGIOGRAPHY N/A 08/05/2017   Procedure: LEFT HEART CATH AND CORONARY ANGIOGRAPHY;  Surgeon: Lorretta Harp, MD;  Location: [place];  Service: Cardiovascular;  Laterality: N/A;  . SHOULDER SURGERY Right    x 2    reports that he quit smoking about 10 years ago. He has a 30.00 pack-year smoking history. He has never used smokeless tobacco. He reports that he does not drink alcohol and  does not use drugs. family history includes Colitis in his mother; Emphysema in his father; Heart disease in his brother and sister; Liver disease in his brother; Rheum arthritis in his mother. No Known Allergies    Outpatient Encounter Medications as of 06/23/2020  Medication Sig  . albuterol (PROVENTIL HFA;VENTOLIN HFA) 108 (90 Base) MCG/ACT inhaler Inhale 2 puffs into the lungs every 6 (six) hours as needed for wheezing or shortness of breath.   . budesonide-formoterol (SYMBICORT) 160-4.5 MCG/ACT inhaler Take 2 puffs first thing in am and then another 2 puffs about 12 hours later. (Patient taking differently: Inhale 2 puffs into the lungs at bedtime.)  . Cinnamon Bark POWD Take by mouth.  Marland Kitchen CINNAMON PO Take 1 capsule by mouth daily.   . clonazePAM (KLONOPIN) 1 MG tablet Take 1 mg by mouth at bedtime.  . finasteride (PROSCAR) 5 MG tablet Take 5 mg by mouth every evening.  . Fluticasone-Umeclidin-Vilant 100-62.5-25 MCG/INH AEPB Inhale into the lungs.  . latanoprost (XALATAN) 0.005 % ophthalmic solution Place 1 drop into both eyes at bedtime.  Marland Kitchen losartan (COZAAR) 25 MG tablet Take 25 mg by mouth at bedtime.  . Multiple Vitamins-Minerals (PRESERVISION AREDS 2 PO) Take 1 capsule by mouth daily.  . predniSONE (DELTASONE)  10 MG tablet Take 10 mg by mouth daily.  . Tamsulosin HCl (FLOMAX) 0.4 MG CAPS Take 0.4 mg by mouth at bedtime.   No facility-administered encounter medications on file as of 06/23/2020.     REVIEW OF SYSTEMS  : All other systems reviewed and negative except where noted in the History of Present Illness.   PHYSICAL EXAM: BP 136/64   Pulse 65   Ht 5' 10.5" (1.791 m)   Wt 166 lb 12.8 oz (75.7 kg)   SpO2 97%   BMI 23.60 kg/m  General: Well developed white male in no acute distress Head: Normocephalic and atraumatic Eyes:  Sclerae anicteric, conjunctiva pink. Ears: Normal auditory acuity Lungs: Clear throughout to auscultation; no W/R/R. Heart: Regular rate and rhythm;  no M/R/G. Abdomen: Soft, non-distended.  BS present.  Non-tender. Rectal:  Will be done at the time of colonoscopy. Musculoskeletal: Symmetrical with no gross deformities  Skin: No lesions on visible extremities Extremities: No edema  Neurological: Alert oriented x 4, grossly non-focal Psychological:  Alert and cooperative. Normal mood and affect  ASSESSMENT AND PLAN: *Diarrhea: Has been present for about the past 6 weeks or so.  He reports that stool studies were negative x2.  No improvement on antibiotics.  Has not had a colonoscopy in about 15 years.  They placed him on prednisone, just low-dose, and diarrhea gets better while he is taking that.  Question microscopic colitis versus IBD.  We will plan for colonoscopy with Dr. Tarri Glenn.  I have asked him to discontinue the prednisone for about a week prior to his colonoscopy in order to hopefully not skew any results.  The risks, benefits, and alternatives to colonoscopy were discussed with the patient and he consents to proceed.    CC:  Hayden Rasmussen, MD

## 2020-06-23 NOTE — Patient Instructions (Signed)
If you are age 77 or older, your body mass index should be between 23-30. Your Body mass index is 23.6 kg/m. If this is out of the aforementioned range listed, please consider follow up with your Primary Care Provider.  If you are age 84 or younger, your body mass index should be between 19-25. Your Body mass index is 23.6 kg/m. If this is out of the aformentioned range listed, please consider follow up with your Primary Care Provider.   You have been scheduled for a colonoscopy. Please follow written instructions given to you at your visit today.  Please pick up your prep supplies at the pharmacy within the next 1-3 days. If you use inhalers (even only as needed), please bring them with you on the day of your procedure.  Please stop Prednisone one week before procedure.  Thank you for choosing me and [facility].  [name] Zehr,PA-C

## 2020-06-24 NOTE — Progress Notes (Signed)
Reviewed and agree with management plans. Will work to obtain the results from her recent stool studies prior to his colonoscopy.   Dawid Dupriest L. Tarri Glenn, MD, MPH

## 2020-06-24 NOTE — Progress Notes (Signed)
Stool studies dated 05/30/2020 showed a negative stool culture including no Salmonella, Shigella, Campylobacter, E. coli, or yersinia.  C. difficile toxin gene by DNA amplification was negative.

## 2020-06-24 NOTE — Progress Notes (Signed)
Received stool studies from Dr. Dennie Fetters office and placed on Dr. Tarri Glenn desk for her review.

## 2020-07-07 ENCOUNTER — Encounter: Payer: PPO | Admitting: Gastroenterology

## 2020-08-19 DIAGNOSIS — J439 Emphysema, unspecified: Secondary | ICD-10-CM | POA: Diagnosis not present

## 2020-08-19 DIAGNOSIS — I10 Essential (primary) hypertension: Secondary | ICD-10-CM | POA: Diagnosis not present

## 2020-08-19 DIAGNOSIS — J0100 Acute maxillary sinusitis, unspecified: Secondary | ICD-10-CM | POA: Diagnosis not present

## 2020-08-19 DIAGNOSIS — I4891 Unspecified atrial fibrillation: Secondary | ICD-10-CM | POA: Diagnosis not present

## 2020-08-22 ENCOUNTER — Telehealth: Payer: Self-pay | Admitting: Gastroenterology

## 2020-08-22 NOTE — Telephone Encounter (Signed)
Hi Dr. Tarri Glenn,   This patient called requesting to transfer care due to scheduling availability over to Dr. Silverio Decamp.  Please advise on scheduling.   Thank you

## 2020-08-23 NOTE — Telephone Encounter (Signed)
Thanks for your help.  KLB 

## 2020-08-23 NOTE — Telephone Encounter (Signed)
Please call the patient. I have some endoscopy openings next week. I would also be happy to schedule at 7:30. If we are still unable to meet his needs, I would be okay with that. I certainly want him to get timing care to address his concerns. Thanks.

## 2020-08-23 NOTE — Telephone Encounter (Signed)
Spoke with pt and he wanted to schedule colon, reports he has been having some dark stools. Pt scheduled for previsit 08/26/20@3 :30pm. Colon scheduled in the [place] 09/02/20@11am . Pt aware of appts. Dr. Tarri Glenn aware.

## 2020-08-26 ENCOUNTER — Ambulatory Visit (AMBULATORY_SURGERY_CENTER): Payer: PPO

## 2020-08-26 ENCOUNTER — Other Ambulatory Visit: Payer: Self-pay

## 2020-08-26 VITALS — Ht 70.5 in | Wt 165.0 lb

## 2020-08-26 DIAGNOSIS — R195 Other fecal abnormalities: Secondary | ICD-10-CM

## 2020-08-26 DIAGNOSIS — Z1211 Encounter for screening for malignant neoplasm of colon: Secondary | ICD-10-CM

## 2020-08-26 MED ORDER — NA SULFATE-K SULFATE-MG SULF 17.5-3.13-1.6 GM/177ML PO SOLN: 1.0000 | Freq: Once | ORAL | 0 refills | Status: AC

## 2020-08-26 NOTE — Progress Notes (Signed)
Pt verified name, DOB, address and insurance during PV today.  Pt mailed instruction packet to included paper to complete and mail back to [facility] with addressed and stamped envelope,  copy of consent form to read and not return, and instructions.PV completed over the phone. Pt encouraged to call with questions or issues.    No allergies to soy or egg Pt is not on blood thinners or diet pills Denies issues with sedation/intubation Denies atrial flutter/fib Denies constipation   Pt is aware of Covid safety and care partner requirements.

## 2020-08-30 ENCOUNTER — Telehealth: Payer: Self-pay | Admitting: *Deleted

## 2020-08-30 NOTE — Telephone Encounter (Signed)
Staff message received that pt hadn't received instructions in mail, requests prep info sent via [facility].  I sent pt's prep info via MyChart. I called pt to verify they received this and they have.

## 2020-08-31 ENCOUNTER — Encounter: Payer: Self-pay | Admitting: Gastroenterology

## 2020-09-02 ENCOUNTER — Encounter: Payer: Self-pay | Admitting: Gastroenterology

## 2020-09-02 ENCOUNTER — Other Ambulatory Visit: Payer: Self-pay

## 2020-09-02 ENCOUNTER — Ambulatory Visit (AMBULATORY_SURGERY_CENTER): Payer: PPO | Admitting: Gastroenterology

## 2020-09-02 VITALS — BP 98/50 | HR 69 | Temp 95.1°F | Resp 18 | Ht 70.5 in | Wt 165.0 lb

## 2020-09-02 DIAGNOSIS — R197 Diarrhea, unspecified: Secondary | ICD-10-CM

## 2020-09-02 DIAGNOSIS — D125 Benign neoplasm of sigmoid colon: Secondary | ICD-10-CM

## 2020-09-02 DIAGNOSIS — K52832 Lymphocytic colitis: Secondary | ICD-10-CM | POA: Diagnosis not present

## 2020-09-02 DIAGNOSIS — Z1211 Encounter for screening for malignant neoplasm of colon: Secondary | ICD-10-CM

## 2020-09-02 MED ORDER — SODIUM CHLORIDE 0.9 % IV SOLN: 500.0000 mL | Freq: Once | INTRAVENOUS | Status: DC

## 2020-09-02 NOTE — Patient Instructions (Signed)
Handout given:  Polyps, Diverticulosis Resume previous diet Continue current medications Await pathology results YOU HAD AN ENDOSCOPIC PROCEDURE TODAY AT [place]:   Refer to the procedure report that was given to you for any specific questions about what was found during the examination.  If the procedure report does not answer your questions, please call your gastroenterologist to clarify.  If you requested that your care partner not be given the details of your procedure findings, then the procedure report has been included in a sealed envelope for you to review at your convenience later.  YOU SHOULD EXPECT: Some feelings of bloating in the abdomen. Passage of more gas than usual.  Walking can help get rid of the air that was put into your GI tract during the procedure and reduce the bloating. If you had a lower endoscopy (such as a colonoscopy or flexible sigmoidoscopy) you may notice spotting of blood in your stool or on the toilet paper. If you underwent a bowel prep for your procedure, you may not have a normal bowel movement for a few days.  Please Note:  You might notice some irritation and congestion in your nose or some drainage.  This is from the oxygen used during your procedure.  There is no need for concern and it should clear up in a day or so.  SYMPTOMS TO REPORT IMMEDIATELY:   Following lower endoscopy (colonoscopy or flexible sigmoidoscopy):  Excessive amounts of blood in the stool  Significant tenderness or worsening of abdominal pains  Swelling of the abdomen that is new, acute  Fever of 100F or higher  For urgent or emergent issues, a gastroenterologist can be reached at any hour by calling [phone]. Do not use MyChart messaging for urgent concerns.   DIET:  We do recommend a small meal at first, but then you may proceed to your regular diet.  Drink plenty of fluids but you should avoid alcoholic beverages for 24 hours.  ACTIVITY:  You should  plan to take it easy for the rest of today and you should NOT DRIVE or use heavy machinery until tomorrow (because of the sedation medicines used during the test).    FOLLOW UP: Our staff will call the number listed on your records 48-72 hours following your procedure to check on you and address any questions or concerns that you may have regarding the information given to you following your procedure. If we do not reach you, we will leave a message.  We will attempt to reach you two times.  During this call, we will ask if you have developed any symptoms of COVID 19. If you develop any symptoms (ie: fever, flu-like symptoms, shortness of breath, cough etc.) before then, please call [phone].  If you test positive for Covid 19 in the 2 weeks post procedure, please call and report this information to [place].    If any biopsies were taken you will be contacted by phone or by letter within the next 1-3 weeks.  Please call us at [phone] if you have not heard about the biopsies in 3 weeks.   SIGNATURES/CONFIDENTIALITY: You and/or your care partner have signed paperwork which will be entered into your electronic medical record.  These signatures attest to the fact that that the information above on your After Visit Summary has been reviewed and is understood.  Full responsibility of the confidentiality of this discharge information lies with you and/or your care-partner.

## 2020-09-02 NOTE — Progress Notes (Signed)
Medical history reviewed with no changes noted. VS assessed by C.W 

## 2020-09-02 NOTE — Op Note (Addendum)
[place] Patient Name: Robert Adams Procedure Date: 09/02/2020 10:36 AM MRN: [id number] Endoscopist: Thornton Park MD, MD Age: 75 Referring MD:  Date of Birth: Oct 22, 1945 Gender: Male Account #: [id number] Procedure:                Colonoscopy Indications:              Chronic diarrhea that improves with steroids                           Prior colonoscopy >12 years ago Medicines:                Monitored Anesthesia Care Procedure:                Pre-Anesthesia Assessment:                           - Prior to the procedure, a History and Physical                            was performed, and patient medications and                            allergies were reviewed. The patient's tolerance of                            previous anesthesia was also reviewed. The risks                            and benefits of the procedure and the sedation                            options and risks were discussed with the patient.                            All questions were answered, and informed consent                            was obtained. Prior Anticoagulants: The patient has                            taken no previous anticoagulant or antiplatelet                            agents. ASA Grade Assessment: III - A patient with                            severe systemic disease. After reviewing the risks                            and benefits, the patient was deemed in                            satisfactory condition to undergo the procedure.  After obtaining informed consent, the colonoscope                            was passed under direct vision. Throughout the                            procedure, the patient's blood pressure, pulse, and                            oxygen saturations were monitored continuously. The                            Olympus [id number]) [id number] was introduced                            through the anus and advanced  to the 3 cm into the                            ileum. The colonoscopy was performed without                            difficulty. The patient tolerated the procedure                            well. The quality of the bowel preparation was                            good. The terminal ileum, ileocecal valve,                            appendiceal orifice, and rectum were photographed. Scope In: 10:44:21 AM Scope Out: 11:00:53 AM Scope Withdrawal Time: 0 hours 13 minutes 36 seconds  Total Procedure Duration: 0 hours 16 minutes 32 seconds  Findings:                 The perianal and digital rectal examinations were                            normal.                           A 6 mm polyp was found in the sigmoid colon,                            located 18 cm from the anal verge. The polyp was                            semi-pedunculated. The polyp was removed with a                            cold snare. Resection and retrieval were complete.                            Estimated blood loss was minimal.  Multiple small and large-mouthed diverticula were                            found in the sigmoid colon and descending colon.                           The remainder of the examined colonic mucosa                            appeared normal. Biopsies were taken from the right                            colon and left colon with a cold forceps for                            histology. Estimated blood loss was minimal.                           The terminal ileum appeared normal. Biopsies were                            taken with a cold forceps for histology. Estimated                            blood loss was minimal. Complications:            No immediate complications. Estimated blood loss:                            Minimal. Estimated Blood Loss:     Estimated blood loss was minimal. Impression:               - One 6 mm polyp in the sigmoid colon, removed with                             a cold snare. Resected and retrieved.                           - Diverticulosis in the sigmoid colon and in the                            descending colon.                           - The entire examined colon is normal. Biopsied.                           - The examination was otherwise normal on direct                            and retroflexion views. Recommendation:           - Patient has a contact number available for  emergencies. The signs and symptoms of potential                            delayed complications were discussed with the                            patient. Return to normal activities tomorrow.                            Written discharge instructions were provided to the                            patient.                           - Resume previous diet. High fiber diet recommended.                           - Continue present medications.                           - Await pathology results.                           - Emerging evidence supports eating a diet of                            fruits, vegetables, grains, calcium, and yogurt                            while reducing red meat and alcohol may reduce the                            risk of colon cancer. Thornton Park MD, MD 09/02/2020 11:09:19 AM This report has been signed electronically.

## 2020-09-02 NOTE — Progress Notes (Signed)
Called to room to assist during endoscopic procedure.  Patient ID and intended procedure confirmed with present staff. Received instructions for my participation in the procedure from the performing physician.  

## 2020-09-02 NOTE — Progress Notes (Signed)
PT taken to PACU. Monitors in place. VSS. Report given to RN. 

## 2020-09-06 ENCOUNTER — Telehealth: Payer: Self-pay

## 2020-09-06 NOTE — Telephone Encounter (Signed)
Second attempt follow up call to pt, lm on vm. 

## 2020-09-06 NOTE — Telephone Encounter (Signed)
No answer, left message to call back later today, B.Yekaterina Escutia RN. 

## 2020-09-13 ENCOUNTER — Other Ambulatory Visit: Payer: Self-pay

## 2020-09-13 MED ORDER — BUDESONIDE 3 MG PO CPEP: 9.0000 mg | ORAL_CAPSULE | Freq: Every day | ORAL | 3 refills | Status: DC

## 2020-09-19 DIAGNOSIS — H353132 Nonexudative age-related macular degeneration, bilateral, intermediate dry stage: Secondary | ICD-10-CM | POA: Diagnosis not present

## 2020-09-19 DIAGNOSIS — H401131 Primary open-angle glaucoma, bilateral, mild stage: Secondary | ICD-10-CM | POA: Diagnosis not present

## 2020-09-19 DIAGNOSIS — H02055 Trichiasis without entropian left lower eyelid: Secondary | ICD-10-CM | POA: Diagnosis not present

## 2020-09-19 DIAGNOSIS — D23121 Other benign neoplasm of skin of left upper eyelid, including canthus: Secondary | ICD-10-CM | POA: Diagnosis not present

## 2020-10-20 DIAGNOSIS — F411 Generalized anxiety disorder: Secondary | ICD-10-CM | POA: Diagnosis not present

## 2020-10-20 DIAGNOSIS — K219 Gastro-esophageal reflux disease without esophagitis: Secondary | ICD-10-CM | POA: Diagnosis not present

## 2020-10-20 DIAGNOSIS — I2510 Atherosclerotic heart disease of native coronary artery without angina pectoris: Secondary | ICD-10-CM | POA: Diagnosis not present

## 2020-10-20 DIAGNOSIS — B349 Viral infection, unspecified: Secondary | ICD-10-CM | POA: Diagnosis not present

## 2020-10-20 DIAGNOSIS — J441 Chronic obstructive pulmonary disease with (acute) exacerbation: Secondary | ICD-10-CM | POA: Diagnosis not present

## 2020-10-27 ENCOUNTER — Ambulatory Visit (INDEPENDENT_AMBULATORY_CARE_PROVIDER_SITE_OTHER): Payer: PPO | Admitting: Gastroenterology

## 2020-10-27 ENCOUNTER — Ambulatory Visit: Payer: PPO | Admitting: Gastroenterology

## 2020-10-27 ENCOUNTER — Encounter: Payer: Self-pay | Admitting: Gastroenterology

## 2020-10-27 VITALS — BP 130/70 | HR 70 | Ht 70.0 in | Wt 162.4 lb

## 2020-10-27 DIAGNOSIS — K52832 Lymphocytic colitis: Secondary | ICD-10-CM | POA: Diagnosis not present

## 2020-10-27 MED ORDER — BUDESONIDE 3 MG PO CPEP: 9.0000 mg | ORAL_CAPSULE | Freq: Every day | ORAL | 3 refills | Status: DC

## 2020-10-27 NOTE — Patient Instructions (Addendum)
Your colonoscopy biopsies showed that your diarrhea has been caused by lymphocytic colitis.  Continue to take budesonide 3 pills every day (for a total dose of 9mg  daily). In July, reduce the dose to 6mg  daily.   I recommend that you go to the UpToDate for more information on lymphocytic colitis.  Drink plenty of water. I don't want you to get dehydrated.   Avoid aspirin, ibuprofen, Aleve, Naproxen, Goody's, BCs, and other NSAIDs. Use of those medications can make the colitis worse.  Consider taking your daily probiotic. Humphrey Rolls is my favorite.   I'd like to see you back in 3 months. Let me know if you need anything in the meantime.    We have scheduled you a follow up with Dr. Tarri Glenn on 01/31/21 at 3:40pm. Your Budesonide has been refilled.  It was great seeing you today! Thank you for entrusting me with your care and choosing [facility].  Dr. Tarri Glenn  If you are age 36 or older, your body mass index should be between 23-30. Your Body mass index is 23.3 kg/m. If this is out of the aforementioned range listed, please consider follow up with your Primary Care Provider.  If you are age 68 or younger, your body mass index should be between 19-25. Your Body mass index is 23.3 kg/m. If this is out of the aformentioned range listed, please consider follow up with your Primary Care Provider.   The [facility] providers would like to encourage you to use [facility] to communicate with providers for non-urgent requests or questions.  Due to long hold times on the telephone, sending your provider a message by [facility] may be faster and more efficient way to get a response. Please allow 48 business hours for a response.  Please remember that this is for non-urgent requests/questions.

## 2020-10-27 NOTE — Progress Notes (Signed)
Referring Provider: Hayden Rasmussen, MD Primary Care Physician:  Hayden Rasmussen, MD  Chief complaint:  Diarrhea   IMPRESSION:  Lymphocytic colitis: Reviewed the diagnosis, natural history, and treatment of lymphocytic colitis.  Continue budesonide 9 mg daily.Continue 3 months after bowel habits return to normal (with a goal of less than 3 bowel movements daily) and then taper to the lowest effective dose. I recommended that the patient avoid all NSAIDs as this may exacerbate a flare.  There is also some association of worsening symptoms with with proton pump inhibitors, H2 blockers, and SSRIs.  Personal history of colon polyp: Tubular adenoma removed on colonoscopy 09/02/20. Could consider colonoscopy in 7 years if it is clinically appropriate at that time.   PLAN: - Continue budesonide 9 mg daily for one additional month, then decrease to 6 mg daily - Avoid NSAIDs - Follow up in 3 months, earlier as needed  Please see the "Patient Instructions" section for addition details about the plan.  HPI: Robert Adams is a 75 y.o. male who returns in follow-up after his colonoscopy 09/02/20.  My first office visit with [name].  He was initially seen by [name] 06/23/2020 for a nearly 92-month history of diarrhea having 4-5 bowel movements a day with some nocturnal awakening.  Empiric antibiotics provided no relief.  He was placed on prednisone 10 mg daily and the diarrhea resolved.  Colonoscopy 09/02/2020 showed a 6 mm sigmoid polyp, left-sided diverticulosis, and normal colonic and terminal ileal mucosa.  Biopsies confirmed lymphocytic colitis and a tubular adenoma.  Biopsies of the terminal ileum were normal.  Symptoms are controlled on Entocort 9 mg daily. He had one episode of diarrhea this week. He is having 1-2 formed BM daily. No blood or mucous. No abdominal pain. Energy level is good. Appetite is good. He has been on prednisone recently for a concurrent chest infection.     Annual labs  with PCP 10/04/19 showed a normal BMP and normal CBC  His mother had colitis. No known family history of colon cancer or polyps. No family history of uterine/endometrial cancer, pancreatic cancer or gastric/stomach cancer.   Past Medical History:  Diagnosis Date  . Allergy    seasonal  . Anxiety   . Arthritis   . BPH (benign prostatic hyperplasia)   . COPD (chronic obstructive pulmonary disease) ([place])   . Depression   . GERD (gastroesophageal reflux disease)   . Glaucoma   . Hypertension   . Sleep apnea     Past Surgical History:  Procedure Laterality Date  . APPENDECTOMY    . COLONOSCOPY     12 yrs ago at least  . HERNIA REPAIR     bilat ing   . KNEE ARTHROSCOPY     rt and lt  . LEFT HEART CATH AND CORONARY ANGIOGRAPHY N/A 08/05/2017   Procedure: LEFT HEART CATH AND CORONARY ANGIOGRAPHY;  Surgeon: Lorretta Harp, MD;  Location: [facility];  Service: Cardiovascular;  Laterality: N/A;  . SHOULDER SURGERY Right    x 2    Current Outpatient Medications  Medication Sig Dispense Refill  . albuterol (PROVENTIL HFA;VENTOLIN HFA) 108 (90 Base) MCG/ACT inhaler Inhale 2 puffs into the lungs every 6 (six) hours as needed for wheezing or shortness of breath.     Marland Kitchen augmented betamethasone dipropionate (DIPROLENE-AF) 0.05 % cream APPLY SMALL AMOUNT TO AFFECTED AREA TWICE A DAY    . budesonide (ENTOCORT EC) 3 MG 24 hr capsule Take 3  capsules (9 mg total) by mouth daily. 90 capsule 3  . budesonide-formoterol (SYMBICORT) 160-4.5 MCG/ACT inhaler Take 2 puffs first thing in am and then another 2 puffs about 12 hours later. (Patient taking differently: Inhale 2 puffs into the lungs at bedtime.) 1 Inhaler 12  . Cinnamon Bark POWD Take by mouth.    . clonazePAM (KLONOPIN) 1 MG tablet Take 1 mg by mouth at bedtime.    . finasteride (PROSCAR) 5 MG tablet Take 5 mg by mouth every evening.    . Fluticasone-Umeclidin-Vilant 100-62.5-25 MCG/INH AEPB Inhale into the lungs.    . latanoprost  (XALATAN) 0.005 % ophthalmic solution Place 1 drop into both eyes at bedtime.    Marland Kitchen losartan (COZAAR) 25 MG tablet Take 25 mg by mouth at bedtime.    . Multiple Vitamins-Minerals (PRESERVISION AREDS 2 PO) Take 1 capsule by mouth daily.    . predniSONE (DELTASONE) 10 MG tablet Take 10 mg by mouth daily. On tapering dose currently for a chest cold.    . Tamsulosin HCl (FLOMAX) 0.4 MG CAPS Take 0.4 mg by mouth at bedtime.     No current facility-administered medications for this visit.    Allergies as of 10/27/2020  . (No Known Allergies)    Family History  Problem Relation Age of Onset  . Emphysema Father        smoked  . Rheum arthritis Mother   . Colitis Mother   . Colon polyps Mother 27  . Heart disease Sister   . Heart disease Brother   . Liver disease Brother   . Colon cancer Neg Hx   . Esophageal cancer Neg Hx   . Pancreatic cancer Neg Hx   . Stomach cancer Neg Hx   . Rectal cancer Neg Hx      Review of Systems: 12 system ROS is negative except as noted above.   Physical Exam: General:   Alert,  well-nourished, pleasant and cooperative in NAD Head:  Normocephalic and atraumatic. Eyes:  Sclera clear, no icterus.   Conjunctiva pink. Abdomen:  Soft, nontender, nondistended, normal bowel sounds, no rebound or guarding. No hepatosplenomegaly.   Rectal:  Deferred  Msk:  Symmetrical. No boney deformities LAD: No inguinal or umbilical LAD Extremities:  No clubbing or edema. Neurologic:  Alert and  oriented x4;  grossly nonfocal Skin:  Intact without significant lesions or rashes. Psych:  Alert and cooperative. Normal mood and affect.    Tyianna Menefee L. Tarri Glenn, MD, MPH 10/27/2020, 9:28 AM

## 2020-11-09 DIAGNOSIS — D23121 Other benign neoplasm of skin of left upper eyelid, including canthus: Secondary | ICD-10-CM | POA: Diagnosis not present

## 2020-11-09 DIAGNOSIS — H40012 Open angle with borderline findings, low risk, left eye: Secondary | ICD-10-CM | POA: Diagnosis not present

## 2020-11-14 DIAGNOSIS — K219 Gastro-esophageal reflux disease without esophagitis: Secondary | ICD-10-CM | POA: Diagnosis not present

## 2020-11-14 DIAGNOSIS — G64 Other disorders of peripheral nervous system: Secondary | ICD-10-CM | POA: Diagnosis not present

## 2020-11-14 DIAGNOSIS — N400 Enlarged prostate without lower urinary tract symptoms: Secondary | ICD-10-CM | POA: Diagnosis not present

## 2020-11-14 DIAGNOSIS — I2510 Atherosclerotic heart disease of native coronary artery without angina pectoris: Secondary | ICD-10-CM | POA: Diagnosis not present

## 2020-11-14 DIAGNOSIS — Z125 Encounter for screening for malignant neoplasm of prostate: Secondary | ICD-10-CM | POA: Diagnosis not present

## 2020-11-14 DIAGNOSIS — I4891 Unspecified atrial fibrillation: Secondary | ICD-10-CM | POA: Diagnosis not present

## 2020-11-14 DIAGNOSIS — I700 Atherosclerosis of aorta: Secondary | ICD-10-CM | POA: Diagnosis not present

## 2020-11-14 DIAGNOSIS — F411 Generalized anxiety disorder: Secondary | ICD-10-CM | POA: Diagnosis not present

## 2020-11-14 DIAGNOSIS — I10 Essential (primary) hypertension: Secondary | ICD-10-CM | POA: Diagnosis not present

## 2020-11-14 DIAGNOSIS — J449 Chronic obstructive pulmonary disease, unspecified: Secondary | ICD-10-CM | POA: Diagnosis not present

## 2020-11-14 DIAGNOSIS — Z0001 Encounter for general adult medical examination with abnormal findings: Secondary | ICD-10-CM | POA: Diagnosis not present

## 2020-11-17 DIAGNOSIS — G5763 Lesion of plantar nerve, bilateral lower limbs: Secondary | ICD-10-CM | POA: Diagnosis not present

## 2020-11-25 DIAGNOSIS — G5763 Lesion of plantar nerve, bilateral lower limbs: Secondary | ICD-10-CM | POA: Diagnosis not present

## 2020-12-02 DIAGNOSIS — M71572 Other bursitis, not elsewhere classified, left ankle and foot: Secondary | ICD-10-CM | POA: Diagnosis not present

## 2020-12-02 DIAGNOSIS — M71571 Other bursitis, not elsewhere classified, right ankle and foot: Secondary | ICD-10-CM | POA: Diagnosis not present

## 2020-12-02 DIAGNOSIS — G5763 Lesion of plantar nerve, bilateral lower limbs: Secondary | ICD-10-CM | POA: Diagnosis not present

## 2020-12-09 DIAGNOSIS — M7752 Other enthesopathy of left foot: Secondary | ICD-10-CM | POA: Diagnosis not present

## 2020-12-09 DIAGNOSIS — M792 Neuralgia and neuritis, unspecified: Secondary | ICD-10-CM | POA: Diagnosis not present

## 2020-12-09 DIAGNOSIS — M7751 Other enthesopathy of right foot: Secondary | ICD-10-CM | POA: Diagnosis not present

## 2021-01-10 DIAGNOSIS — L820 Inflamed seborrheic keratosis: Secondary | ICD-10-CM | POA: Diagnosis not present

## 2021-01-10 DIAGNOSIS — D485 Neoplasm of uncertain behavior of skin: Secondary | ICD-10-CM | POA: Diagnosis not present

## 2021-01-31 ENCOUNTER — Ambulatory Visit: Payer: PPO | Admitting: Gastroenterology

## 2021-02-16 DIAGNOSIS — I4891 Unspecified atrial fibrillation: Secondary | ICD-10-CM | POA: Diagnosis not present

## 2021-02-16 DIAGNOSIS — I2510 Atherosclerotic heart disease of native coronary artery without angina pectoris: Secondary | ICD-10-CM | POA: Diagnosis not present

## 2021-02-16 DIAGNOSIS — I10 Essential (primary) hypertension: Secondary | ICD-10-CM | POA: Diagnosis not present

## 2021-02-16 DIAGNOSIS — R0609 Other forms of dyspnea: Secondary | ICD-10-CM | POA: Diagnosis not present

## 2021-02-16 DIAGNOSIS — J441 Chronic obstructive pulmonary disease with (acute) exacerbation: Secondary | ICD-10-CM | POA: Diagnosis not present

## 2021-02-27 DIAGNOSIS — I2510 Atherosclerotic heart disease of native coronary artery without angina pectoris: Secondary | ICD-10-CM | POA: Diagnosis not present

## 2021-02-27 DIAGNOSIS — R0609 Other forms of dyspnea: Secondary | ICD-10-CM | POA: Diagnosis not present

## 2021-02-27 DIAGNOSIS — J441 Chronic obstructive pulmonary disease with (acute) exacerbation: Secondary | ICD-10-CM | POA: Diagnosis not present

## 2021-02-27 DIAGNOSIS — F411 Generalized anxiety disorder: Secondary | ICD-10-CM | POA: Diagnosis not present

## 2021-03-03 ENCOUNTER — Ambulatory Visit (INDEPENDENT_AMBULATORY_CARE_PROVIDER_SITE_OTHER): Payer: PPO | Admitting: Gastroenterology

## 2021-03-03 ENCOUNTER — Encounter: Payer: Self-pay | Admitting: Gastroenterology

## 2021-03-03 VITALS — BP 122/64 | HR 84 | Ht 69.25 in | Wt 165.1 lb

## 2021-03-03 DIAGNOSIS — K52832 Lymphocytic colitis: Secondary | ICD-10-CM | POA: Diagnosis not present

## 2021-03-03 NOTE — Progress Notes (Signed)
Referring Provider: Hayden Rasmussen, MD Primary Care Physician:  Hayden Rasmussen, MD  Chief complaint:  Lymphocytic colitis   IMPRESSION:  Lymphocytic colitis: Asymptomatic off budesonide. I recommended that he continue to avoid all NSAIDs as well as proton pump inhibitors, H2 blockers, and SSRIs.  Personal history of colon polyp: Tubular adenoma removed on colonoscopy 09/02/20. Could consider colonoscopy in 7 years if it is clinically appropriate at that time.   PLAN: - Resume budesonide 9 mg daily if diarrhea recurrs - Avoid NSAIDs - Follow up PRN with any recurrent symptoms  Please see the "Patient Instructions" section for addition details about the plan.  HPI: Robert Adams is a 75 y.o. male who returns in follow-up for lymphocytic colitis.   He was initially seen by [name] 06/23/2020 for a nearly 69-month history of diarrhea having 4-5 bowel movements a day with some nocturnal awakening.  Empiric antibiotics provided no relief.  He was placed on prednisone 10 mg daily and the diarrhea resolved.  Colonoscopy 09/02/2020 showed a 6 mm sigmoid polyp, left-sided diverticulosis, and normal colonic and terminal ileal mucosa.  Biopsies confirmed lymphocytic colitis and a tubular adenoma.  Biopsies of the terminal ileum were normal.  Symptoms are controlled on Entocort 9 mg daily. He had one episode of diarrhea this week. He is having 1-2 formed BM daily. No blood or mucous. No abdominal pain. Energy level is good. Appetite is good. He has been on prednisone recently for a concurrent chest infection.     He tapered off the Entocort after his last visit. He is having 1-2 BM formed bowel movements. Sense of complete evacuation. No blood or mucous in the stool.   Annual labs with PCP 10/04/19 showed a normal BMP and normal CBC  His mother had colitis. No known family history of colon cancer or polyps. No family history of uterine/endometrial cancer, pancreatic cancer or gastric/stomach  cancer.   Past Medical History:  Diagnosis Date   Allergy    seasonal   Anxiety    Arthritis    BPH (benign prostatic hyperplasia)    COPD (chronic obstructive pulmonary disease) ([facility])    Depression    GERD (gastroesophageal reflux disease)    Glaucoma    Hypertension    Sleep apnea     Past Surgical History:  Procedure Laterality Date   APPENDECTOMY     COLONOSCOPY     12 yrs ago at least   [place] ing    KNEE ARTHROSCOPY     rt and lt   LEFT HEART CATH AND CORONARY ANGIOGRAPHY N/A 08/05/2017   Procedure: LEFT HEART CATH AND CORONARY ANGIOGRAPHY;  Surgeon: Lorretta Harp, MD;  Location: [facility];  Service: Cardiovascular;  Laterality: N/A;   SHOULDER SURGERY Right    x 2    Current Outpatient Medications  Medication Sig Dispense Refill   albuterol (PROVENTIL HFA;VENTOLIN HFA) 108 (90 Base) MCG/ACT inhaler Inhale 2 puffs into the lungs every 6 (six) hours as needed for wheezing or shortness of breath.      augmented betamethasone dipropionate (DIPROLENE-AF) 0.05 % cream APPLY SMALL AMOUNT TO AFFECTED AREA TWICE A DAY     budesonide-formoterol (SYMBICORT) 160-4.5 MCG/ACT inhaler Take 2 puffs first thing in am and then another 2 puffs about 12 hours later. (Patient taking differently: Inhale 2 puffs into the lungs at bedtime.) 1 Inhaler 12   clonazePAM (KLONOPIN) 1 MG tablet Take 1 mg by mouth  at bedtime.     finasteride (PROSCAR) 5 MG tablet Take 5 mg by mouth every evening.     Fluticasone-Umeclidin-Vilant 100-62.5-25 MCG/INH AEPB Inhale into the lungs.     ipratropium-albuterol (DUONEB) 0.5-2.5 (3) MG/3ML SOLN Take 3 mLs by nebulization every 6 (six) hours as needed.     losartan (COZAAR) 25 MG tablet Take 25 mg by mouth at bedtime.     Multiple Vitamins-Minerals (PRESERVISION AREDS 2 PO) Take 1 capsule by mouth daily.     predniSONE (DELTASONE) 10 MG tablet Take 10 mg by mouth daily. On tapering dose currently for a chest cold.     Tamsulosin  HCl (FLOMAX) 0.4 MG CAPS Take 0.4 mg by mouth at bedtime.     Vitamin D, Ergocalciferol, (DRISDOL) 1.25 MG (50000 UNIT) CAPS capsule Take 50,000 Units by mouth once a week.     budesonide (ENTOCORT EC) 3 MG 24 hr capsule Take 3 capsules (9 mg total) by mouth daily. (Patient not taking: Reported on 03/03/2021) 90 capsule 3   No current facility-administered medications for this visit.    Allergies as of 03/03/2021 - Review Complete 03/03/2021  Allergen Reaction Noted   Grass extracts [gramineae pollens]  06/06/2018    Family History  Problem Relation Age of Onset   Emphysema Father        smoked   Rheum arthritis Mother    Colitis Mother    Colon polyps Mother 35   Heart disease Sister    Heart disease Brother    Liver disease Brother    Colon cancer Neg Hx    Esophageal cancer Neg Hx    Pancreatic cancer Neg Hx    Stomach cancer Neg Hx    Rectal cancer Neg Hx     Physical Exam: General:   Alert,  well-nourished, pleasant and cooperative in NAD Head:  Normocephalic and atraumatic. Eyes:  Sclera clear, no icterus.   Conjunctiva pink. Abdomen:  Soft, nontender, nondistended, normal bowel sounds, no rebound or guarding. No hepatosplenomegaly.   Neurologic:  Alert and  oriented x4;  grossly nonfocal Skin:  Intact without significant lesions or rashes. Psych:  Alert and cooperative. Normal mood and affect.    Marianny Goris L. Tarri Glenn, MD, MPH 03/03/2021, 10:57 AM

## 2021-03-03 NOTE — Patient Instructions (Signed)
If you are age 77 or older, your body mass index should be between 23-30. Your Body mass index is 24.21 kg/m. If this is out of the aforementioned range listed, please consider follow up with your Primary Care Provider.  If you are age 46 or younger, your body mass index should be between 19-25. Your Body mass index is 24.21 kg/m. If this is out of the aformentioned range listed, please consider follow up with your Primary Care Provider.   __________________________________________________________  The [place] [facility] providers would like to encourage you to use [facility] to communicate with providers for non-urgent requests or questions.  Due to long hold times on the telephone, sending your provider a message by [facility] may be a faster and more efficient way to get a response.  Please allow 48 business hours for a response.  Please remember that this is for non-urgent requests.   Follow up as needed.   It was a pleasure to see you today!  Thank you for trusting me with your gastrointestinal care!

## 2021-03-06 ENCOUNTER — Other Ambulatory Visit: Payer: Self-pay | Admitting: Family Medicine

## 2021-03-06 ENCOUNTER — Encounter: Payer: Self-pay | Admitting: Internal Medicine

## 2021-03-06 ENCOUNTER — Ambulatory Visit (INDEPENDENT_AMBULATORY_CARE_PROVIDER_SITE_OTHER): Payer: PPO

## 2021-03-06 ENCOUNTER — Ambulatory Visit: Payer: PPO | Admitting: Internal Medicine

## 2021-03-06 ENCOUNTER — Other Ambulatory Visit: Payer: Self-pay

## 2021-03-06 VITALS — BP 132/60 | HR 72 | Ht 70.0 in | Wt 164.0 lb

## 2021-03-06 DIAGNOSIS — R058 Other specified cough: Secondary | ICD-10-CM | POA: Insufficient documentation

## 2021-03-06 DIAGNOSIS — J449 Chronic obstructive pulmonary disease, unspecified: Secondary | ICD-10-CM

## 2021-03-06 DIAGNOSIS — R0609 Other forms of dyspnea: Secondary | ICD-10-CM

## 2021-03-06 DIAGNOSIS — R0602 Shortness of breath: Secondary | ICD-10-CM | POA: Diagnosis not present

## 2021-03-06 DIAGNOSIS — Z87891 Personal history of nicotine dependence: Secondary | ICD-10-CM

## 2021-03-06 DIAGNOSIS — R059 Cough, unspecified: Secondary | ICD-10-CM | POA: Diagnosis not present

## 2021-03-06 MED ORDER — FAMOTIDINE 20 MG PO TABS: ORAL_TABLET | ORAL | 11 refills | Status: DC

## 2021-03-06 MED ORDER — PANTOPRAZOLE SODIUM 40 MG PO TBEC: 40.0000 mg | DELAYED_RELEASE_TABLET | Freq: Every day | ORAL | 2 refills | Status: DC

## 2021-03-06 MED ORDER — BUDESONIDE-FORMOTEROL FUMARATE 80-4.5 MCG/ACT IN AERO: INHALATION_SPRAY | RESPIRATORY_TRACT | 12 refills | Status: DC

## 2021-03-06 NOTE — Assessment & Plan Note (Addendum)
Quit smoking  2012  - Spirometry 12/27/2016  FEV1 1.45 (45%)  Ratio 47  With no rx prior  - 03/06/2021  D/c trelegy 200 and trial of symb 80 2bid with prn saba  - 03/06/2021   Walked on RA x  3  lap(s) =  approx 750 @ fast pace, stopped due to sob with lowest 02 sats 89%   DDX of  difficult airways management almost all start with A and  include Adherence, Ace Inhibitors, Acid Reflux, Active Sinus Disease, Alpha 1 Antitripsin deficiency, Anxiety masquerading as Airways dz,  ABPA,  Allergy(esp in young), Aspiration (esp in elderly), Adverse effects of meds,  Active smoking or vaping, A bunch of PE's (a small clot burden can't cause this syndrome unless there is already severe underlying pulm or vascular dz with poor reserve) plus two Bs  = Bronchiectasis and Beta blocker use..and one C= CHF   Adherence is always the initial "prime suspect" and is a multilayered concern that requires a "trust but verify" approach in every patient - starting with knowing how to use medications, especially inhalers, correctly, keeping up with refills and understanding the fundamental difference between maintenance and prns vs those medications only taken for a very short course and then stopped and not refilled.  - - The proper method of use, as well as anticipated side effects, of a metered-dose inhaler were discussed and demonstrated to the patient using teach back method.  - return with all meds in hand using a trust but verify approach to confirm accurate Medication  Reconciliation The principal here is that until we are certain that the  patients are doing what we've asked, it makes no sense to ask them to do more.    ? Acid (or non-acid) GERD > always difficult to exclude as up to 75% of pts in some series report no assoc GI/ Heartburn symptoms> rec max (24h)  acid suppression and diet restrictions/ reviewed and instructions given in writing.   ? Adverse drug reaction from high dose DPI causing worse UACS (see separate  a/p)   ? Allergy /asthma > try low dose symbicort due to uacs (see sep a/p)/ allergy profile next ov  - Re SABA :  I spent extra time with pt today reviewing appropriate use of albuterol for prn use on exertion with the following points: 1) saba is for relief of sob that does not improve by walking a slower pace or resting but rather if the pt does not improve after trying this first. 2) If the pt is convinced, as many are, that saba helps recover from activity faster then it's easy to tell if this is the case by re-challenging : ie stop, take the inhaler, then p 5 minutes try the exact same activity (intensity of workload) that just caused the symptoms and see if they are substantially diminished or not after saba 3) if there is an activity that reproducibly causes the symptoms, try the saba 15 min before the activity on alternate days   If in fact the saba really does help, then fine to continue to use it prn but advised may need to look closer at the maintenance regimen being used to achieve better control of airways disease with exertion.   ? Anxiety > usually at the bottom of this list of usual suspects but may interfere with adherence and also interpretation of response or lack thereof to symptom management which can be quite subjective.   ? Alpha one at >  check phenotype next ov

## 2021-03-06 NOTE — Progress Notes (Signed)
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 [name]. Pt c/o SOB x 4 months. He states he gets SOB if he walks too fast, walks up an incline or does "too much work".    doe = MMRC1 = can walk nl pace, flat grade, can't hurry or go up hills or steps s sob mb and back 200 ft slt grade walking back to house but not so sob he has to stop when gets back  Some better after saba/ no need at rest or noct / now rarely using saba  Rec Plan A = Automatic if having bad enough breathing to justify it = symbicort 160 Take 2 puffs first thing in am and then another 2 puffs about 12 hours later.  Work on inhaler technique:    Plan B = Backup Only use your albuterol (proair) as a rescue medication   03/06/2021  re-establish COPD GOLD 3/Zayvien Canning re: new doe    maint on trelegy 200 with lots of throat clearing "all his life'  Chief Complaint  Patient presents with   Pulmonary Consult    Referred by Dr [name]. Pt seen in the past- last in 2018. He c/o increased DOE x 5 wks- noticed while golfing. He gets winded walking up stairs or lifting things. Pred has helped some- currently on taper. He is using his albuterol inhaler 1-2 x per day.   Dyspnea:  mb to house is a struggle but can still do it  Cough: hoarse/ throating worse since breathing got wors  Sleeping: mild elevation electric bed  SABA use: not helping nor is trelegy  02: none S Covid status:   never vaccinated / had 1st wave    No obvious day to day or daytime variability or assoc excess/  purulent sputum or mucus plugs or hemoptysis or cp or chest tightness, subjective wheeze or overt sinus or hb symptoms.   Sleeping p clonazepam  without nocturnal  or early am exacerbation  of respiratory  c/o's or need for noct saba. Also denies any obvious fluctuation of symptoms with weather or environmental changes or other aggravating or alleviating factors except as outlined above   No unusual exposure hx or h/o childhood pna/ asthma or knowledge of premature birth.  Current Allergies, Complete Past Medical History, Past Surgical History, Family History, and Social History were reviewed in [employer] record.  ROS  The following are not active complaints unless bolded Hoarseness, sore throat= globus, dysphagia, dental problems, itching, sneezing,  nasal congestion or discharge of excess mucus or purulent secretions, ear ache,   fever, chills, sweats, unintended wt loss or wt gain, classically pleuritic or exertional cp,  orthopnea pnd or arm/hand swelling  or leg swelling, presyncope, palpitations, abdominal pain, anorexia, nausea, vomiting, diarrhea  or change in bowel habits or change in bladder habits, change in stools or change in urine, dysuria, hematuria,  rash, arthralgias, visual complaints, headache, numbness, weakness or ataxia  or problems with walking or coordination,  change in mood or  memory.        Current Meds  Medication Sig   albuterol (PROVENTIL HFA;VENTOLIN HFA) 108 (90 Base) MCG/ACT inhaler Inhale 2 puffs into the lungs every 6 (six) hours as needed for wheezing or shortness of breath.    augmented betamethasone dipropionate (DIPROLENE-AF) 0.05 % cream APPLY SMALL AMOUNT TO AFFECTED AREA TWICE A DAY   clonazePAM (KLONOPIN) 1 MG tablet Take 1 mg by mouth at bedtime.   finasteride (PROSCAR) 5 MG tablet Take 5 mg by mouth every evening.   Fluticasone-Umeclidin-Vilant 100-62.5-25 MCG/INH AEPB Inhale 1 puff into the lungs daily.   ipratropium-albuterol  (DUONEB) 0.5-2.5 (3) MG/3ML SOLN Take 3 mLs by nebulization every 6 (six) hours as needed.   losartan (COZAAR) 25 MG tablet Take 25 mg by mouth at bedtime.            Tamsulosin HCl (FLOMAX) 0.4 MG CAPS Take 0.4 mg by mouth at bedtime.   Vitamin D, Ergocalciferol, (DRISDOL) 1.25 MG (50000 UNIT) CAPS capsule Take 50,000 Units by mouth once a week.                   Objective:   Physical Exam    03/06/2021       164   12/27/16 167 lb (75.8 kg)  10/22/16 165 lb (74.8 kg)  06/24/16 160 lb (72.6 kg)    Vital signs reviewed  03/06/2021  - Note at rest 02 sats  99% on RA   General appearance:    amb slt hoarse wm nad freq throat clearing     HEENT : pt wearing mask not removed for exam due to covid - 19 concerns.    NECK :  without JVD/Nodes/TM/ nl carotid upstrokes bilaterally   LUNGS: no acc muscle use,  Mild barrel  contour chest wall with bilateral  Distant bs s audible wheeze and  without cough on insp or exp maneuvers  and mild  Hyperresonant  to  percussion bilaterally     CV:  RRR  no s3 or murmur or increase in P2, and no edema   ABD:  soft and nontender with pos end  insp Hoover's  in the supine position. No bruits or organomegaly appreciated, bowel sounds nl  MS:   Nl gait/  ext warm without deformities, calf tenderness, cyanosis or clubbing No obvious joint restrictions   SKIN: warm and dry without lesions    NEURO:  alert, approp, nl sensorium with  no motor or cerebellar deficits apparent.        CXR PA and Lateral:   03/06/2021 :    I personally reviewed images  / impression as follows:    Copd / slt prominent PA's on lat    Assessment:

## 2021-03-06 NOTE — Patient Instructions (Addendum)
Plan A = Automatic = Always=    Symbicort 80 Take 2 puffs first thing in am and then another 2 puffs about 12 hours later.    Work on inhaler technique:  relax and gently blow all the way out then take a nice smooth full deep breath back in, triggering the inhaler at same time you start breathing in.  Hold for up to 5 seconds if you can. Blow symbicort out thru nose. Rinse and gargle with water when done.  If mouth or throat bother you at all,  try brushing teeth/gums/tongue with arm and hammer toothpaste/ make a slurry and gargle and spit out.   Pantoprazole (protonix) 40 mg   Take  30-60 min before first meal of the day and Pepcid (famotidine)  20 mg after supper until return to office - this is the best way to tell whether stomach acid is contributing to your problem.       Plan B = Backup (to supplement plan A, not to replace it) Only use your albuterol inhaler as a rescue medication to be used if you can't catch your breath by resting or doing a relaxed purse lip breathing pattern.  - The less you use it, the better it will work when you need it. - Ok to use the inhaler up to 2 puffs  every 4 hours if you must but call for appointment if use goes up over your usual need - Don't leave home without it !!  (think of it like the spare tire for your car)   Plan C = Crisis (instead of Plan B but only if Plan B stops working) - only use your albuterol nebulizer if you first try Plan B and it fails to help > ok to use the nebulizer up to every 4 hours but if start needing it regularly call for immediate appointment  GERD (REFLUX)  is an extremely common cause of respiratory symptoms just like yours , many times with no obvious heartburn at all.    It can be treated with medication, but also with lifestyle changes including elevation of the head of your bed (ideally with 6 -8inch blocks under the headboard of your bed),  Smoking cessation, avoidance of late meals, excessive alcohol, and avoid fatty  foods, chocolate, peppermint, colas, red wine, and acidic juices such as orange juice.  NO MINT OR MENTHOL PRODUCTS SO NO COUGH DROPS  USE SUGARLESS CANDY INSTEAD (Jolley ranchers or Stover's or Life Savers) or even ice chips will also do - the key is to swallow to prevent all throat clearing. NO OIL BASED VITAMINS - use powdered substitutes.  Avoid fish oil when coughing.   Please remember to go to the  x-ray department  for your tests - we will call you with the results when they are available     Please schedule a follow up office visit in 4 weeks, sooner if needed - bring your inhalers and solutions Add needs allergy profile, alpha one phenotype next ov

## 2021-03-06 NOTE — Assessment & Plan Note (Addendum)
Onset "all his life'  (son has it too)  Upper airway cough syndrome (previously labeled PNDS),  is so named because it's frequently impossible to sort out how much is  CR/sinusitis with freq throat clearing (which can be related to primary GERD)   vs  causing  secondary (" extra esophageal")  GERD from wide swings in gastric pressure that occur with throat clearing, often  promoting self use of mint and menthol lozenges that reduce the lower esophageal sphincter tone and exacerbate the problem further in a cyclical fashion.   These are the same pts (now being labeled as having "irritable larynx syndrome" by some cough centers) who not infrequently have a history of having failed to tolerate ace inhibitors,  dry powder inhalers or biphosphonates or report having atypical/extraesophageal reflux symptoms (like globus) that don't respond to standard doses of PPI  and are easily confused as having aecopd or asthma flares by even experienced allergists/ pulmonologists (myself included).   Rec:  Max gerd rx/ non-mint/ menthol hard rock candies and regroup in 4-6 weeks   Each maintenance medication was reviewed in detail including emphasizing most importantly the difference between maintenance and prns and under what circumstances the prns are to be triggered using an action plan format where appropriate.  Total time for H and P, chart review, counseling, reviewing hfa device(s) , directly observing portions of ambulatory 02 saturation study/ and generating customized AVS unique to this office visit / same day charting = 48 min

## 2021-03-07 ENCOUNTER — Encounter: Payer: Self-pay | Admitting: Internal Medicine

## 2021-03-07 ENCOUNTER — Telehealth: Payer: Self-pay | Admitting: Internal Medicine

## 2021-03-07 NOTE — Telephone Encounter (Signed)
See result note.  

## 2021-03-07 NOTE — Progress Notes (Signed)
Spoke with pt and notified of results per Dr. Melvyn Novas. Pt verbalized understanding and denied any questions. BMET ordered and CTA ordered- pt agreeable to both

## 2021-03-07 NOTE — Telephone Encounter (Signed)
Received call report from [facility] with [facility] on patient's CXR done on 03/06/21. Dr. Melvyn Novas  please review the result/impression copied below:  IMPRESSION: Possible nodular density seen in left lingular region; CT scan of the chest is recommended to rule out pulmonary nodule or mass. These results will be called to the ordering clinician or representative by the Radiologist Assistant, and communication documented in the PACS or zVision Dashboard.   Please advise, thank you.

## 2021-03-08 ENCOUNTER — Other Ambulatory Visit: Payer: PPO

## 2021-03-08 DIAGNOSIS — R0609 Other forms of dyspnea: Secondary | ICD-10-CM

## 2021-03-08 DIAGNOSIS — J449 Chronic obstructive pulmonary disease, unspecified: Secondary | ICD-10-CM

## 2021-03-08 LAB — BASIC METABOLIC PANEL
BUN: 16 mg/dL (ref 6–23)
CO2: 27 mEq/L (ref 19–32)
Calcium: 9.3 mg/dL (ref 8.4–10.5)
Chloride: 104 mEq/L (ref 96–112)
Creatinine, Ser: 0.84 mg/dL (ref 0.40–1.50)
GFR: 83.98 mL/min (ref >60.00)
Glucose, Bld: 84 mg/dL (ref 70–99)
Potassium: 4.4 mEq/L (ref 3.5–5.1)
Sodium: 138 mEq/L (ref 135–145)

## 2021-03-13 ENCOUNTER — Ambulatory Visit
Admission: RE | Admit: 2021-03-13 | Discharge: 2021-03-13 | Disposition: A | Discharge status: Home / Self Care | Payer: PPO | Source: Ambulatory Visit | Attending: Internal Medicine | Admitting: Internal Medicine

## 2021-03-13 ENCOUNTER — Other Ambulatory Visit: Payer: Self-pay

## 2021-03-13 DIAGNOSIS — R0609 Other forms of dyspnea: Secondary | ICD-10-CM

## 2021-03-13 DIAGNOSIS — J432 Centrilobular emphysema: Secondary | ICD-10-CM | POA: Diagnosis not present

## 2021-03-13 DIAGNOSIS — I2510 Atherosclerotic heart disease of native coronary artery without angina pectoris: Secondary | ICD-10-CM | POA: Diagnosis not present

## 2021-03-13 DIAGNOSIS — I700 Atherosclerosis of aorta: Secondary | ICD-10-CM | POA: Diagnosis not present

## 2021-03-13 DIAGNOSIS — M47814 Spondylosis without myelopathy or radiculopathy, thoracic region: Secondary | ICD-10-CM | POA: Diagnosis not present

## 2021-03-13 MED ORDER — IOPAMIDOL (ISOVUE-370) INJECTION 76%
75.0000 mL | Freq: Once | INTRAVENOUS | Status: AC | PRN
  Administered 2021-03-13: 75 mL via INTRAVENOUS

## 2021-03-17 NOTE — Progress Notes (Signed)
Called pt, no answer and VM full, WCB

## 2021-03-24 NOTE — Progress Notes (Signed)
Spoke with spouse ok per DPR and notified of results/recs. She will let the pt know.

## 2021-03-31 ENCOUNTER — Encounter: Payer: Self-pay | Admitting: Internal Medicine

## 2021-03-31 ENCOUNTER — Ambulatory Visit: Payer: PPO | Admitting: Internal Medicine

## 2021-03-31 ENCOUNTER — Other Ambulatory Visit: Payer: Self-pay

## 2021-03-31 DIAGNOSIS — J449 Chronic obstructive pulmonary disease, unspecified: Secondary | ICD-10-CM

## 2021-03-31 DIAGNOSIS — R058 Other specified cough: Secondary | ICD-10-CM | POA: Diagnosis not present

## 2021-03-31 LAB — CBC WITH DIFFERENTIAL/PLATELET
Basophils Absolute: 0 10*3/uL (ref 0.0–0.1)
Basophils Relative: 0.5 % (ref 0.0–3.0)
Eosinophils Absolute: 0.1 10*3/uL (ref 0.0–0.7)
Eosinophils Relative: 1.2 % (ref 0.0–5.0)
HCT: 44.8 % (ref 39.0–52.0)
Hemoglobin: 15.1 g/dL (ref 13.0–17.0)
Lymphocytes Relative: 11.5 % — ABNORMAL LOW (ref 12.0–46.0)
Lymphs Abs: 1.1 10*3/uL (ref 0.7–4.0)
MCHC: 33.7 g/dL (ref 30.0–36.0)
MCV: 88 fl (ref 78.0–100.0)
Monocytes Absolute: 0.9 10*3/uL (ref 0.1–1.0)
Monocytes Relative: 9.8 % (ref 3.0–12.0)
Neutro Abs: 7.5 10*3/uL (ref 1.4–7.7)
Neutrophils Relative %: 77 % (ref 43.0–77.0)
Platelets: 377 10*3/uL (ref 150.0–400.0)
RBC: 5.09 Mil/uL (ref 4.22–5.81)
RDW: 14 % (ref 11.5–15.5)
WBC: 9.7 10*3/uL (ref 4.0–10.5)

## 2021-03-31 NOTE — Assessment & Plan Note (Signed)
Onset "all his life'  (son has it too) - no response to gerd rx so d/c p 6 weeks 03/31/2021   Fits pattern of habitual cough/ consider refer to ENT / [facility] Dr Joya Gaskins or trial of gabapentin titrated as high as 300  Mg qid but pt wants to defer this for now         Each maintenance medication was reviewed in detail including emphasizing most importantly the difference between maintenance and prns and under what circumstances the prns are to be triggered using an action plan format where appropriate.  Total time for H and P, chart review, counseling, reviewing hfa device(s) and generating customized AVS unique to this office visit / same day charting = 33 min

## 2021-03-31 NOTE — Patient Instructions (Addendum)
Please remember to go to the lab department   for your tests - we will call you with the results when they are available.  Ok to stop reflux medication       Ok to try albuterol 15 min before an activity (on alternating days)  that you know would usually make you short of breath and see if it makes any difference and if makes none then don't take albuterol after activity unless you can't catch your breath as this means it's the resting that helps, not the albuterol.     Make sure you check your oxygen saturation at your highest level of activity to be sure it stays over 90% and keep track of it at least once a week, more often if breathing getting worse, and let me know if losing ground.   .Please schedule a follow up office visit in 6 weeks with PFTs - bring inhalers with you but don't use them that day

## 2021-03-31 NOTE — Progress Notes (Signed)
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 Claris Adams. Pt c/o SOB x 4 months. He states he gets SOB if he walks too fast, walks up an incline or does "too much work".    doe = MMRC1 = can walk nl pace, flat grade, can't hurry or go up hills or steps s sob mb and back 200 ft slt grade walking back to house but not so sob he has to stop when gets back  Some better after saba/ no need at rest or noct / now rarely using saba  Rec Plan A = Automatic if having bad enough breathing to justify it = symbicort 160 Take 2 puffs first thing in am and then another 2 puffs about 12 hours later.  Work on inhaler technique:    Plan B = Backup Only use your albuterol (proair) as a rescue medication   03/06/2021  re-establish COPD GOLD 3/Theophil Thivierge re: new doe    maint on trelegy 200 with lots of throat clearing "all his life'  Chief Complaint  Patient presents with   Pulmonary Consult    Referred by Dr Horald Adams. Pt seen in the past- last in 2018. He c/o increased DOE x 5 wks- noticed while golfing. He gets winded walking up stairs or lifting things. Pred has helped some- currently on taper. He is using his albuterol inhaler 1-2 x per day.   Dyspnea:  mb to house is a struggle but can still do it  Cough: hoarse/ throating worse since breathing got wors  Sleeping: mild elevation electric bed  SABA use: not helping nor is trelegy  02: none S Covid status:   never vaccinated / had 1st wave  Rec Plan A = Automatic = Always= Symbicort 80 Take 2 puffs first  thing in am and then another 2 puffs about 12 hours later.  Work on inhaler technique:  Pantoprazole (protonix) 40 mg   Take  30-60 min before first meal of the day and Pepcid (famotidine)  20 mg after supper until return to office -   Plan B = Backup (to supplement plan A, not to replace it) Only use your albuterol inhaler as a rescue medication Plan C = Crisis (instead of Plan B but only if Plan B stops working) - only use your albuterol nebulizer if you first try Plan B and it fails to help > ok to use the nebulizer up to every 4 hours but if start needing it regularly call for immediate appointment GERD diet reviewed, bed blocks rec   Please schedule a follow up office visit in 4 weeks, sooner if needed - bring your inhalers and solutions Add needs allergy profile, alpha one phenotype next ov    03/31/2021  f/u ov/[name] re: GOLD 3 copd/ throat clearing   maint on symb 80/ gerd rx and no better   Chief Complaint  Patient presents with   Follow-up    Breathing is unchanged since  the last visit. He has cough with clear sputum past 2-3 wks. He rarely uses his albuterol inhaler or neb.   Dyspnea:  mb to house uphill 200 ft and has to stop  Cough: throat clearing no better on hard candy  Sleeping: electric slt elevation does fine s resp cc  SABA use: rarely / does not really help but does not prechallenge  02: none  Covid status:   vax x none,  covid 2 y prior to ov - probably the alpha    No obvious day to day or daytime variability or assoc excess/ purulent sputum or mucus plugs or hemoptysis or cp or chest tightness, subjective wheeze or overt sinus or hb symptoms.   Sleeping  without nocturnal  or early am exacerbation  of respiratory  c/o's or need for noct saba. Also denies any obvious fluctuation of symptoms with weather or environmental changes or other aggravating or alleviating factors except as outlined above   No unusual exposure hx or h/o childhood pna/ asthma or knowledge of  premature birth.  Current Allergies, Complete Past Medical History, Past Surgical History, Family History, and Social History were reviewed in [employer] record.  ROS  The following are not active complaints unless bolded Hoarseness, sore throat, dysphagia, dental problems, itching, sneezing,  nasal congestion or discharge of excess mucus or purulent secretions, ear ache,   fever, chills, sweats, unintended wt loss or wt gain, classically pleuritic or exertional cp,  orthopnea pnd or arm/hand swelling  or leg swelling, presyncope, palpitations, abdominal pain, anorexia, nausea, vomiting, diarrhea  or change in bowel habits or change in bladder habits, change in stools or change in urine, dysuria, hematuria,  rash, arthralgias, visual complaints, headache, numbness, weakness or ataxia or problems with walking or coordination,  change in mood or  memory.        Current Meds  Medication Sig   albuterol (PROVENTIL HFA;VENTOLIN HFA) 108 (90 Base) MCG/ACT inhaler Inhale 2 puffs into the lungs every 6 (six) hours as needed for wheezing or shortness of breath.    augmented betamethasone dipropionate (DIPROLENE-AF) 0.05 % cream APPLY SMALL AMOUNT TO AFFECTED AREA TWICE A DAY   budesonide-formoterol (SYMBICORT) 80-4.5 MCG/ACT inhaler Take 2 puffs first thing in am and then another 2 puffs about 12 hours later.   clonazePAM (KLONOPIN) 1 MG tablet Take 1 mg by mouth at bedtime.   famotidine (PEPCID) 20 MG tablet One after supper   finasteride (PROSCAR) 5 MG tablet Take 5 mg by mouth every evening.   ipratropium-albuterol (DUONEB) 0.5-2.5 (3) MG/3ML SOLN Take 3 mLs by nebulization every 6 (six) hours as needed.   losartan (COZAAR) 25 MG tablet Take 25 mg by mouth at bedtime.   Multiple Vitamins-Minerals (PRESERVISION AREDS 2 PO) Take 1 capsule by mouth daily.   pantoprazole (PROTONIX) 40 MG tablet Take 1 tablet (40 mg total) by mouth daily. Take 30-60 min before first meal of the day    Tamsulosin HCl (FLOMAX) 0.4 MG CAPS Take 0.4 mg by mouth at bedtime.   Vitamin D, Ergocalciferol, (DRISDOL) 1.25 MG (50000 UNIT) CAPS capsule Take 50,000 Units by mouth once a week.                      Objective:   Physical Exam     03/31/2021      166  03/06/2021       164   12/27/16 167 lb (75.8 kg)  10/22/16 165 lb (  74.8 kg)  06/24/16 160 lb (72.6 kg)    Vital signs reviewed  03/31/2021  - Note at rest 02 sats  93% on RA   General appearance:    somber hoarse wm nad    HEENT : pt wearing mask not removed for exam due to covid - 19 concerns.    NECK :  without JVD/Nodes/TM/ nl carotid upstrokes bilaterally   LUNGS: no acc muscle use,  Mild barrel  contour chest wall with bilateral  Distant bs s audible wheeze and  without cough on insp or exp maneuvers  and mild  Hyperresonant  to  percussion bilaterally     CV:  RRR  no s3 or murmur or increase in P2, and no edema   ABD:  soft and nontender with pos end  insp Hoover's  in the supine position. No bruits or organomegaly appreciated, bowel sounds nl  MS:   Nl gait/  ext warm without deformities, calf tenderness, cyanosis or clubbing No obvious joint restrictions   SKIN: warm and dry without lesions    NEURO:  alert, approp, nl sensorium with  no motor or cerebellar deficits apparent.         I personally reviewed images and agree with radiology impression as follows:   Chest CTa  10/10/222 Neg PE  Moderate centrilobular emphysema. Scarring noted within the lingula and right middle lobe       Labs ordered 03/31/2021  :  allergy profile   alpha one AT phenotype   Assessment:

## 2021-03-31 NOTE — Assessment & Plan Note (Addendum)
Quit smoking  2012  - Spirometry 12/27/2016  FEV1 1.45 (45%)  Ratio 47  With no rx prior  - 03/06/2021  D/c trelegy 200 and trial of symb 80 2bid with prn saba  - 03/06/2021   Walked on RA x  3  lap(s) =  approx 750 @ fast pace, stopped due to sob with lowest 02 sats 89% - 03/13/21  CTa  Neg x for ASCVD/ emphysema  - Labs ordered 03/31/2021  :  allergy profile   alpha one AT phenotype    - The proper method of use, as well as anticipated side effects, of a metered-dose inhaler were discussed and demonstrated to the patient using teach back method >  Continue symb 80 2bid for now plus approp saba  Re saba" Re SABA :  I spent extra time with pt today reviewing appropriate use of albuterol for prn use on exertion with the following points: 1) saba is for relief of sob that does not improve by walking a slower pace or resting but rather if the pt does not improve after trying this first. 2) If the pt is convinced, as many are, that saba helps recover from activity faster then it's easy to tell if this is the case by re-challenging : ie stop, take the inhaler, then p 5 minutes try the exact same activity (intensity of workload) that just caused the symptoms and see if they are substantially diminished or not after saba 3) if there is an activity that reproducibly causes the symptoms, try the saba 15 min before the activity on alternate days   If in fact the saba really does help, then fine to continue to use it prn but advised may need to look closer at the maintenance regimen being used to achieve better control of airways disease with exertion.   Challenge here is finding an effective inhaler that doesn't cause his uacs to become worse >> f/u with pfts and regroup then ? stiolto better choice

## 2021-04-03 ENCOUNTER — Telehealth: Payer: Self-pay | Admitting: Internal Medicine

## 2021-04-03 ENCOUNTER — Telehealth: Payer: Self-pay | Admitting: Gastroenterology

## 2021-04-03 LAB — IGE: IgE (Immunoglobulin E), Serum: 20 kU/L (ref <=114)

## 2021-04-03 NOTE — Telephone Encounter (Signed)
Attempted to call pt to discuss his symptoms with him directly. Wife states she has routed all phone #'s to call her directly. Further adds, pt is clearing his throat repeatedly and is wanting him to have an EGD. Advised I would like to discuss the pt symptoms with him directly. States she will need to have him call me back later today. Pt currently scheduled with [facility] 11/16. Advised I would like to determine if pt may benefit from a sooner appt but cannot make this decision without speaking with pt directly about his symptoms. Will await pt returned call.

## 2021-04-03 NOTE — Telephone Encounter (Signed)
Pt returned call. States he has been having a productive cough, a feeling of a lump on the L side of his throat, voice is very hoarse and has been having some dyspnea. Denies fever, N/V, chest pain or burning sensation in throat. Also denies abd pain or worsening symptoms at night. Confirms he is taking Pantoprazole qd and Pepcid qd. Diet consists of caffeine, soda, dairy and spicy/fatty foods.  Provided the following recommendations:  Follow up with PCP or pulmonologist re: productive cough and dyspnea Eliminate or reduce consumption of caffeine, soda, spicy/fatty foods and dairy Increase Pantoprazole to BID before meals and continue Pepcid qpm Keep f/u appt with [facility] as scheduled on 11/16  Will route this message to Dr. Tarri Glenn for further advice/recommendations

## 2021-04-03 NOTE — Telephone Encounter (Signed)
Patients wife called stating the patient is having issus with his throat seeking advise.

## 2021-04-04 NOTE — Telephone Encounter (Signed)
This is a perfectly normal finding and does not suggest need for any additional studies  - the limits of normal both higher and lower contain at total of 10% of the normal population so we see this all the time.

## 2021-04-04 NOTE — Telephone Encounter (Signed)
Called the pt's spouse Vickie and there was no answer- LMTCB.

## 2021-04-04 NOTE — Telephone Encounter (Signed)
Spoke with the pt's spouse ok per DPR She states that they reviewed lab results in mychart and that pt is concerned bc his lymphocytes were low  Please advise thanks

## 2021-04-05 LAB — ALPHA-1-ANTITRYPSIN PHENOTYP: A-1 Antitrypsin: 96 mg/dL — ABNORMAL LOW (ref 101–187)

## 2021-04-14 DIAGNOSIS — M47812 Spondylosis without myelopathy or radiculopathy, cervical region: Secondary | ICD-10-CM | POA: Insufficient documentation

## 2021-04-14 DIAGNOSIS — I498 Other specified cardiac arrhythmias: Secondary | ICD-10-CM | POA: Insufficient documentation

## 2021-04-14 DIAGNOSIS — L309 Dermatitis, unspecified: Secondary | ICD-10-CM | POA: Insufficient documentation

## 2021-04-14 DIAGNOSIS — G4700 Insomnia, unspecified: Secondary | ICD-10-CM | POA: Insufficient documentation

## 2021-04-19 ENCOUNTER — Encounter: Payer: Self-pay | Admitting: Nurse Practitioner

## 2021-04-19 ENCOUNTER — Ambulatory Visit: Payer: PPO | Admitting: Nurse Practitioner

## 2021-04-19 VITALS — BP 108/62 | HR 77 | Ht 69.25 in | Wt 167.0 lb

## 2021-04-19 DIAGNOSIS — K219 Gastro-esophageal reflux disease without esophagitis: Secondary | ICD-10-CM

## 2021-04-19 DIAGNOSIS — R1311 Dysphagia, oral phase: Secondary | ICD-10-CM

## 2021-04-19 DIAGNOSIS — K52832 Lymphocytic colitis: Secondary | ICD-10-CM

## 2021-04-19 NOTE — Patient Instructions (Signed)
If you are age 77 or older, your body mass index should be between 23-30. Your Body mass index is 24.48 kg/m. If this is out of the aforementioned range listed, please consider follow up with your Primary Care Provider.  The [facility] providers would like to encourage you to use [facility] to communicate with providers for non-urgent requests or questions.  Due to long hold times on the telephone, sending your provider a message by [facility] may be faster and more efficient way to get a response. Please allow 48 business hours for a response.  Please remember that this is for non-urgent requests/questions.  IMAGING: You will be contacted by [facility] (Your caller ID will indicate phone # [phone]) in the next 7 days to schedule your Swallow Test. If you have not heard from them within 7 business days, please call [facility] at [phone] to follow up on the status of your appointment.    RECOMMENDATIONS: Continue taking Pantoprazole 40 MG tablet every morning. Take Famotidine 20 MG every night before bed. Discuss with your primary doctor regarding referral to ENT.  It was great seeing you today! Thank you for entrusting me with your care and choosing [facility].  Noralyn Pick, CRNP

## 2021-04-19 NOTE — Progress Notes (Signed)
RADIOLOGY SCHEDULING REQUEST FOR Barium with tablet [facility] Scheduling via secure staff message.

## 2021-04-19 NOTE — Progress Notes (Signed)
04/19/2021 ABAD MANARD [id number] May 18, 1946   Chief Complaint: Lump in side of throat  History of Present Illness: Thaniel C. Deremer is a 75 year old male with a past medical history of lymphocytic colitis and colon polyps. He is followed by Dr. Tarri Glenn. He presents to our office today with complaints of frequent throat clearing and he feels knot or lump to the left side of his throat. He describes feeling as if saliva gets hung up in his throat. Food or water briefly gets stuck in his throat which occurs once every week or two. He reported seeing an ENT 05/2020 and an laryngoscopy was reported as normal. He was seen by his pulmonologist who thought he might have acid reflux. He started taking Pantoprazole 40mg  QD and Famotidine 20mg  after dinner 3 weeks ago with a noticeable improvement in his throat clearing. He is also eating a healthier diet, avoiding spicy and fatty foods. He is passing a normal formed BM daily. He's remained off Budesonide (which he took for lymphocytic colitis) since 03/2021.He uses Symbicort inhaler daily for COPD.  Denies ever having an EGD. He underwent a colonoscopy 09/02/2020, see results below.   Colonoscopy 09/02/2020 showed a 6 mm sigmoid polyp, left-sided diverticulosis, and normal colonic and terminal ileal mucosa.  Biopsies confirmed lymphocytic colitis and a tubular adenoma.  Biopsies of the terminal ileum were normal.   CBC Latest Ref Rng & Units 03/31/2021 10/04/2019 07/26/2017  WBC 4.0 - 10.5 K/uL 9.7 8.4 9.9  Hemoglobin 13.0 - 17.0 g/dL 15.1 15.5 16.9  Hematocrit 39.0 - 52.0 % 44.8 46.9 48.4  Platelets 150.0 - 400.0 K/uL 377.0 391 408(H)    CMP Latest Ref Rng & Units 03/08/2021 10/04/2019 05/16/2018  Glucose 70 - 99 mg/dL 84 89 -  BUN 6 - 23 mg/dL 16 16 -  Creatinine 0.40 - 1.50 mg/dL 0.84 0.95 0.97  Sodium 135 - 145 mEq/L 138 141 -  Potassium 3.5 - 5.1 mEq/L 4.4 4.5 -  Chloride 96 - 112 mEq/L 104 106 -  CO2 19 - 32 mEq/L 27 27 -  Calcium 8.4 - 10.5  mg/dL 9.3 9.3 -  Total Protein 6.0 - 8.5 g/dL - - -  Total Bilirubin 0.0 - 1.2 mg/dL - - -  Alkaline Phos 39 - 117 IU/L - - -  AST 0 - 40 IU/L - - -  ALT 0 - 44 IU/L - - -     Current Outpatient Medications on File Prior to Visit  Medication Sig Dispense Refill   albuterol (PROVENTIL HFA;VENTOLIN HFA) 108 (90 Base) MCG/ACT inhaler Inhale 2 puffs into the lungs every 6 (six) hours as needed for wheezing or shortness of breath.      augmented betamethasone dipropionate (DIPROLENE-AF) 0.05 % cream APPLY SMALL AMOUNT TO AFFECTED AREA TWICE A DAY     budesonide-formoterol (SYMBICORT) 80-4.5 MCG/ACT inhaler Take 2 puffs first thing in am and then another 2 puffs about 12 hours later. 1 each 12   clonazePAM (KLONOPIN) 1 MG tablet Take 1 mg by mouth at bedtime.     famotidine (PEPCID) 20 MG tablet One after supper 30 tablet 11   finasteride (PROSCAR) 5 MG tablet Take 5 mg by mouth every evening.     ipratropium-albuterol (DUONEB) 0.5-2.5 (3) MG/3ML SOLN Take 3 mLs by nebulization every 6 (six) hours as needed.     losartan (COZAAR) 25 MG tablet Take 25 mg by mouth at bedtime.     Multiple Vitamins-Minerals (PRESERVISION  AREDS 2 PO) Take 1 capsule by mouth daily.     pantoprazole (PROTONIX) 40 MG tablet Take 1 tablet (40 mg total) by mouth daily. Take 30-60 min before first meal of the day 30 tablet 2   Tamsulosin HCl (FLOMAX) 0.4 MG CAPS Take 0.4 mg by mouth at bedtime.     Vitamin D, Ergocalciferol, (DRISDOL) 1.25 MG (50000 UNIT) CAPS capsule Take 50,000 Units by mouth once a week.     No current facility-administered medications on file prior to visit.   Allergies  Allergen Reactions   Grass Extracts [Gramineae Pollens]     Other reaction(s): Other (See Comments) Sneezing   Current Medications, Allergies, Past Medical History, Past Surgical History, Family History and Social History were reviewed in [employer] record.  Review of Systems:   Constitutional: Negative  for fever, sweats, chills or weight loss.  Respiratory: Negative for shortness of breath.   Cardiovascular: Negative for chest pain, palpitations and leg swelling.  Gastrointestinal: See HPI.  Musculoskeletal: Negative for back pain or muscle aches.  Neurological: Negative for dizziness, headaches or paresthesias.   Physical Exam: BP 108/62   Pulse 77   Ht 5' 9.25" (1.759 m)   Wt 167 lb (75.8 kg)   SpO2 93%   BMI 24.48 kg/m  General: 75 year old male in NAD.  Head: Normocephalic and atraumatic. Eyes: No scleral icterus. Conjunctiva pink . Ears: Normal auditory acuity. Mouth: Dentition intact. No ulcers or lesions.  Lungs: Clear throughout to auscultation. Heart: Regular rate and rhythm, no murmur. Abdomen: Soft, nontender and nondistended. No masses or hepatomegaly. Normal bowel sounds x 4 quadrants.  Rectal: Deferred.  Musculoskeletal: Symmetrical with no gross deformities. Extremities: No edema. Neurological: Alert oriented x 4. No focal deficits.  Psychological: Alert and cooperative. Normal mood and affect  Assessment and Recommendations: 56) 75 year old male with frequent throat clearing, oral phase dysphagia with improvement since initiating Pantoprazole and Famotidine 3 weeks ago.  -Barium swallow with tablet to rule out esophageal stricuture, esophageal diverticula and esophageal dysmotility  -If Barium swallow study negative, to consider EGD to rule out GERD/candidiasis esophagitis -Continue Pantoprazole 40mg  Qam and Famotidine 20mg  Q evening -Recommended follow up with ENT,  patient to discuss 2nd opinion ENT with PCP  2) Lymphocytic colitis, in remission. No longer on Budesonide.

## 2021-04-20 NOTE — Telephone Encounter (Signed)
Called and spoke to pt and pt's spouse. Informed them of the results per MW. Pt verbalized understanding and denied any further questions or concerns at this time.

## 2021-04-23 NOTE — Progress Notes (Signed)
Reviewed and agree with management plans. ? ?Dewie Ahart L. Effie Janoski, MD, MPH  ?

## 2021-04-25 ENCOUNTER — Ambulatory Visit (HOSPITAL_COMMUNITY)
Admission: RE | Admit: 2021-04-25 | Discharge: 2021-04-25 | Disposition: A | Discharge status: Home / Self Care | Payer: PPO | Source: Ambulatory Visit | Attending: Nurse Practitioner | Admitting: Nurse Practitioner

## 2021-04-25 ENCOUNTER — Other Ambulatory Visit: Payer: Self-pay

## 2021-04-25 DIAGNOSIS — K52832 Lymphocytic colitis: Secondary | ICD-10-CM | POA: Insufficient documentation

## 2021-04-25 DIAGNOSIS — M5416 Radiculopathy, lumbar region: Secondary | ICD-10-CM | POA: Diagnosis not present

## 2021-04-25 DIAGNOSIS — K219 Gastro-esophageal reflux disease without esophagitis: Secondary | ICD-10-CM | POA: Insufficient documentation

## 2021-04-25 DIAGNOSIS — I10 Essential (primary) hypertension: Secondary | ICD-10-CM | POA: Diagnosis not present

## 2021-04-25 DIAGNOSIS — R1311 Dysphagia, oral phase: Secondary | ICD-10-CM

## 2021-04-25 DIAGNOSIS — R0989 Other specified symptoms and signs involving the circulatory and respiratory systems: Secondary | ICD-10-CM | POA: Diagnosis not present

## 2021-04-25 DIAGNOSIS — R1310 Dysphagia, unspecified: Secondary | ICD-10-CM | POA: Diagnosis not present

## 2021-05-02 ENCOUNTER — Telehealth: Payer: Self-pay

## 2021-05-02 DIAGNOSIS — I2510 Atherosclerotic heart disease of native coronary artery without angina pectoris: Secondary | ICD-10-CM | POA: Diagnosis not present

## 2021-05-02 DIAGNOSIS — G64 Other disorders of peripheral nervous system: Secondary | ICD-10-CM | POA: Diagnosis not present

## 2021-05-02 DIAGNOSIS — I10 Essential (primary) hypertension: Secondary | ICD-10-CM | POA: Diagnosis not present

## 2021-05-02 DIAGNOSIS — M48061 Spinal stenosis, lumbar region without neurogenic claudication: Secondary | ICD-10-CM | POA: Diagnosis not present

## 2021-05-02 DIAGNOSIS — R7301 Impaired fasting glucose: Secondary | ICD-10-CM | POA: Diagnosis not present

## 2021-05-02 NOTE — Telephone Encounter (Signed)
Discussed the results with the spouse. She tells me the patient saw an ENT 1 year ago for this issue.  She also tells me she has tried eliminating dairy from the patient's diet. This seems to make his symptoms less severe. When she added dairy back, he began having the same issue. The elimination of dairy did not stop all symptoms, but the throat clearing was less.  Asks if the ENT consult will be beneficial.

## 2021-05-02 NOTE — Telephone Encounter (Signed)
-----   Message from Noralyn Pick, NP sent at 04/30/2021 12:11 PM EST ----- Beth pls contact the patient and let him know his barium study showed a normal esophagus. Dr. Tarri Glenn recommended for him to see an ENT. See her response to my prior msg below: [place], MD  Noralyn Pick, NP I recommend ENT consultation. Thank you.    Pls enter ENT referral for globus sensation localized to left throat and frequent throat clearing.   Patient to follow up with Dr .Tarri Glenn after ENT consult if these symptoms persist and ENT evaluation unrevealing. thx

## 2021-05-04 NOTE — Telephone Encounter (Signed)
Beth, thank you for the update, I would advise for him to follow up with the ENT he previously saw. Stay off dairy as his symptoms improved off dairy. Follow up with Dr .Tarri Glenn if ENT evaluation negative. Thx

## 2021-05-04 NOTE — Telephone Encounter (Signed)
Called. N answer. Left the information on the voicemail.

## 2021-05-10 ENCOUNTER — Ambulatory Visit: Payer: PPO | Admitting: Primary Care

## 2021-05-10 DIAGNOSIS — R1084 Generalized abdominal pain: Secondary | ICD-10-CM | POA: Diagnosis not present

## 2021-05-11 DIAGNOSIS — R109 Unspecified abdominal pain: Secondary | ICD-10-CM | POA: Diagnosis not present

## 2021-05-11 DIAGNOSIS — K5792 Diverticulitis of intestine, part unspecified, without perforation or abscess without bleeding: Secondary | ICD-10-CM | POA: Diagnosis not present

## 2021-05-11 DIAGNOSIS — I2510 Atherosclerotic heart disease of native coronary artery without angina pectoris: Secondary | ICD-10-CM | POA: Diagnosis not present

## 2021-05-11 DIAGNOSIS — F411 Generalized anxiety disorder: Secondary | ICD-10-CM | POA: Diagnosis not present

## 2021-05-16 DIAGNOSIS — M4807 Spinal stenosis, lumbosacral region: Secondary | ICD-10-CM | POA: Diagnosis not present

## 2021-05-16 DIAGNOSIS — M7138 Other bursal cyst, other site: Secondary | ICD-10-CM | POA: Diagnosis not present

## 2021-05-16 DIAGNOSIS — M47817 Spondylosis without myelopathy or radiculopathy, lumbosacral region: Secondary | ICD-10-CM | POA: Diagnosis not present

## 2021-06-01 ENCOUNTER — Telehealth: Payer: Self-pay | Admitting: Orthopaedic Surgery

## 2021-06-01 NOTE — Telephone Encounter (Signed)
Pt's wife Loletha Carrow called requesting a call back about new pt back injections. Wasn't sure if new pt visit is 15 min or 30 min. Please call pt to set this appt. Pt phone number is [phone].

## 2021-06-07 ENCOUNTER — Telehealth: Payer: Self-pay

## 2021-06-07 NOTE — Telephone Encounter (Signed)
Called pt back and spoke with Robert Adams and informed him Dr. Sammuel Hines does not perform dry needling, he refers patients to the [facility] office. Patient understood and did not have any follow up questions

## 2021-06-07 NOTE — Telephone Encounter (Signed)
Pts wife called and stating that she wants a call back from Dr. Roger Shelter office to set up an apt regarding her husband she stated she was told by a patient of Dr. Roger Shelter that he preforms Dry needling and she wants that done. She would like a call back today.   I was unsure if Dr. Sammuel Hines Preformed Dry needling so I told the patient that we would call her back. Please advise

## 2021-06-28 ENCOUNTER — Other Ambulatory Visit: Payer: Self-pay

## 2021-06-28 ENCOUNTER — Encounter: Payer: Self-pay | Admitting: Physical Therapy

## 2021-06-28 ENCOUNTER — Ambulatory Visit: Payer: PPO | Attending: Family Medicine | Admitting: Physical Therapy

## 2021-06-28 DIAGNOSIS — R252 Cramp and spasm: Secondary | ICD-10-CM | POA: Insufficient documentation

## 2021-06-28 DIAGNOSIS — R293 Abnormal posture: Secondary | ICD-10-CM | POA: Diagnosis present

## 2021-06-28 DIAGNOSIS — G8929 Other chronic pain: Secondary | ICD-10-CM | POA: Insufficient documentation

## 2021-06-28 DIAGNOSIS — M5441 Lumbago with sciatica, right side: Secondary | ICD-10-CM | POA: Insufficient documentation

## 2021-06-28 NOTE — Patient Instructions (Signed)
Trigger Point Dry Needling  What is Trigger Point Dry Needling (DN)? DN is a physical therapy technique used to treat muscle pain and dysfunction. Specifically, DN helps deactivate muscle trigger points (muscle knots).  A thin filiform needle is used to penetrate the skin and stimulate the underlying trigger point. The goal is for a local twitch response (LTR) to occur and for the trigger point to relax. No medication of any kind is injected during the procedure.   What Does Trigger Point Dry Needling Feel Like?  The procedure feels different for each individual patient. Some patients report that they do not actually feel the needle enter the skin and overall the process is not painful. Very mild bleeding may occur. However, many patients feel a deep cramping in the muscle in which the needle was inserted. This is the local twitch response.   How Will I feel after the treatment? Soreness is normal, and the onset of soreness may not occur for a few hours. Typically this soreness does not last longer than two days.  Bruising is uncommon, however; ice can be used to decrease any possible bruising.  In rare cases feeling tired or nauseous after the treatment is normal. In addition, your symptoms may get worse before they get better, this period will typically not last longer than 24 hours.   What Can I do After My Treatment? Increase your hydration by drinking more water for the next 24 hours. You may place ice or heat on the areas treated that have become sore, however, do not use heat on inflamed or bruised areas. Heat often brings more relief post needling. You can continue your regular activities, but vigorous activity is not recommended initially after the treatment for 24 hours. DN is best combined with other physical therapy such as strengthening, stretching, and other therapies.  Robert Adams [address] [address].  [phone]    Access  Code: [email or web address] URL: [email or web address] Date: 06/28/2021 Prepared by: Venetia Night Rachid Parham  Exercises Hooklying Single Knee to Chest Stretch - 1 x daily - 7 x weekly - 1 sets - 3 reps - 5 hold Supine Piriformis Stretch with Foot on Ground - 1 x daily - 7 x weekly - 1 sets - 1 reps - 30 hold Supine Figure 4 Piriformis Stretch - 1 x daily - 7 x weekly - 1 sets - 1 reps - 30 hold Supine Hamstring Stretch - 1 x daily - 7 x weekly - 1 sets - 1 reps - 30 hold

## 2021-06-28 NOTE — Therapy (Signed)
[facility] @ [place], [place], [address] Phone: [phone]   Fax:  [phone]  Physical Therapy Evaluation  Patient Details  Name: Robert Adams MRN: [id number] Date of Birth: Feb 29, 1946 Referring Provider (PT): Hayden Rasmussen, MD   Encounter Date: 06/28/2021   PT End of Session - 06/28/21 1330     Visit Number 1    Date for PT Re-Evaluation 08/23/21    Authorization Type [employer]    Progress Note Due on Visit -1    PT Start Time 1015    PT Stop Time 1100    PT Time Calculation (min) 45 min    Activity Tolerance Patient tolerated treatment well    Behavior During Therapy [facility] for tasks assessed/performed             Past Medical History:  Diagnosis Date   Allergy    seasonal   Anxiety    Arthritis    BPH (benign prostatic hyperplasia)    COPD (chronic obstructive pulmonary disease) ([place])    Depression    GERD (gastroesophageal reflux disease)    Glaucoma    Hypertension    Sleep apnea     Past Surgical History:  Procedure Laterality Date   APPENDECTOMY     COLONOSCOPY     12 yrs ago at least   Clint ing    KNEE ARTHROSCOPY     rt and lt   LEFT HEART CATH AND CORONARY ANGIOGRAPHY N/A 08/05/2017   Procedure: LEFT HEART CATH AND CORONARY ANGIOGRAPHY;  Surgeon: Lorretta Harp, MD;  Location: [facility];  Service: Cardiovascular;  Laterality: N/A;   SHOULDER SURGERY Right    x 2    There were no vitals filed for this visit.    Subjective Assessment - 06/28/21 1016     Subjective Pt is referred to OPPT for chronic LBP with numbness and pain into middle toes on Rt foot.  Symptoms have been present x 1-2 years.    Pertinent History PMH: COPD, Hx of smoking (quit 2012), HTN, sleep apnea    Diagnostic tests lumbar xray 2019: mild diffuse degenerative changes throughout, had MRI 2022 of lumbar spine - arthritis throughout    Currently in Pain? Yes     Pain Score 8     Pain Location Back    Pain Orientation Lower;Mid;Left;Right    Pain Descriptors / Indicators Numbness    Pain Type Chronic pain    Pain Radiating Towards numbness into middle toes Rt foot    Pain Onset More than a month ago    Pain Frequency Constant    Aggravating Factors  getting out of bed in AM    Pain Relieving Factors hot shower, stretching before golf    Effect of Pain on Daily Activities golf, does most everything but with pain                [facility] PT Assessment - 06/28/21 0001       Assessment   Medical Diagnosis M48.00 (ICD-10-CM) - Spinal stenosis  M47.819 (ICD-10-CM) - Spondylosis without myelopathy or radiculopathy, site unspecified  M54.50 (ICD-10-CM) - Low back pain, unspecified    Referring Provider (PT) Hayden Rasmussen, MD    Onset Date/Surgical Date --   1-2 years ago   Prior Therapy has had body work      Precautions   Precautions None  Restrictions   Weight Bearing Restrictions No      Balance Screen   Has the patient fallen in the past 6 months No    Has the patient had a decrease in activity level because of a fear of falling?  No    Is the patient reluctant to leave their home because of a fear of falling?  No      Home Environment   Living Environment Private residence    Living Arrangements Spouse/significant other    Type of [place] to enter    Home Layout Two level      Prior Function   Level of [place] Retired    Leisure Agricultural consultant   Overall Cognitive Status Within Functional Limits for tasks assessed      Observation/Other Assessments   Focus on Therapeutic Outcomes (FOTO)  54% goal 59%      Posture/Postural Control   Posture/Postural Control Postural limitations    Postural Limitations Decreased thoracic kyphosis;Decreased lumbar lordosis    Posture Comments Rt lateral shift      ROM / Strength   AROM / PROM / Strength AROM;PROM;Strength       AROM   AROM Assessment Site Lumbar    Lumbar Flexion 40    Lumbar Extension 0    Lumbar - Right Side Bend 5    Lumbar - Left Side Bend 0    Lumbar - Right Rotation 2    Lumbar - Left Rotation 2      PROM   PROM Assessment Site Hip    Right/Left Hip Right;Left    Right Hip Extension 5    Right Hip Flexion 100    Right Hip External Rotation  45    Right Hip Internal Rotation  0    Left Hip Extension 5    Left Hip Flexion 100    Left Hip External Rotation  35    Left Hip Internal Rotation  5      Strength   Overall Strength Comments 4/5 bil LEs      Flexibility   Soft Tissue Assessment /Muscle Length yes    Hamstrings 65 deg bil    Quadriceps prone 100 deg bil    ITB limited on Rt 50%    Piriformis limited 50% bil    Quadratus Lumborum limited 50% bil      Palpation   Spinal mobility poor segmental mobility throughout lumbar spine, limited rib springing Rt>Lt, hypomobile thoracic PAs throughout    SI assessment  WNL    Palpation comment bil lumbar multifidi, Rt gluteals, Rt piriformis, bil QL, Rt ITB, Rt peroneals      Transfers   Transfers Independent with all Transfers                        Objective measurements completed on examination: See above findings.       [facility] Adult PT Treatment/Exercise - 06/28/21 0001       Self-Care   Self-Care Other Self-Care Comments    Other Self-Care Comments  initiated medbridge HEP for AM stretches before rising from bed                     PT Education - 06/28/21 1340     Education Details Access Code: [email or web address], stretches and DN info    Person(s) Educated Patient  Methods Explanation;Demonstration;Handout    Comprehension Verbalized understanding;Returned demonstration              PT Short Term Goals - 06/28/21 1342       PT SHORT TERM GOAL #1   Title Pt will be ind with initial HEP to target improved flexibility and ROM    Time 4    Period Weeks    Status New    Target  Date 07/26/21               PT Long Term Goals - 06/28/21 1342       PT LONG TERM GOAL #1   Title Pt will be ind with advanced HEP and understand how to stretch before playing golf    Time 8    Period Weeks    Status New    Target Date 08/23/21      PT LONG TERM GOAL #2   Title Pt will report reduced pain with daily activities by at least 50%    Baseline pain reaches 8/10    Time 8    Period Weeks    Status New    Target Date 08/23/21      PT LONG TERM GOAL #3   Title Pt will improve LE flexibility to within 25% of WNL to reduce undue strain on spine and pelvis.    Time 8    Period Weeks    Status New    Target Date 08/23/21      PT LONG TERM GOAL #4   Title Pt will achieve trunk ROM of at least 60 deg of flexion and 10 deg of ext for improved functional task performance.    Time 8    Period Weeks    Status New    Target Date 08/23/21      PT LONG TERM GOAL #5   Title Pt will improve FOTO score to at least 59% to demo improved function.    Baseline 54%    Time 8    Period Weeks    Status New    Target Date 08/23/21                    Plan - 06/28/21 1333     Clinical Impression Statement Pt is a pleasant 76yo male referred to OPPT for chronic LBP with spinal stenosis.  Imaging shows diffuse degenerative changes throughout lumbar spine with stenosis in lower lumbar and sacral regions.  Pt reports he has numbness and pain that extend into his middle toes of Rt foot.  He finds partial relief with hot showers and stretching.  Pain levels are up to 8/10.  Pt plays golf and hopes to be able to continue playing again soon.  Pt presents with significant ROM deficits throughout spine and bil hips.  LE flexibility is significantly limited in multiple muscle groups.  He has tenderness along bil lumbar soft tissues, QL and gluteals, with extending tenderness along Rt ITB and peroneals.  Skin rolling is restricted along lumbar spine demonstrating tight fascia.  LE  strength is grossly 4/5.  FOTO score is 54% with goal of 59%.  Pt will benefit from skilled PT 1x/week to address mobility restrictions using manual techniques, ther ex, and HEP development to optimize function with less pain.    Personal Factors and Comorbidities Age;Time since onset of injury/illness/exacerbation;Comorbidity 1    Comorbidities COPD limits endurance    Examination-Activity Limitations Bend;Stand;Sit;Lift;Carry;Squat;Locomotion Level    Examination-Participation Restrictions Community  Activity;Cleaning;Yard Work    Stability/Clinical Decision Making Stable/Uncomplicated    Designer, jewellery Low    Rehab Potential Good    PT Frequency 1x / week    PT Duration 8 weeks    PT Treatment/Interventions ADLs/Self Care Home Management;Electrical Stimulation;Traction;Functional mobility training;Therapeutic activities;Therapeutic exercise;Neuromuscular re-education;Manual techniques;Patient/family education;Passive range of motion;Dry needling;Joint Manipulations;Spinal Manipulations;Taping;Moist Heat    PT Next Visit Plan DN lumbar spine and hips, review HEP and progress trunk ROM/hip mobility, golf mobility    PT Home Exercise Plan Access Code: [email or web address]    Consulted and Agree with Plan of Care Patient             Patient will benefit from skilled therapeutic intervention in order to improve the following deficits and impairments:  Decreased range of motion, Increased fascial restricitons, Increased muscle spasms, Decreased activity tolerance, Pain, Decreased endurance, Improper body mechanics, Impaired flexibility, Hypomobility, Decreased mobility, Decreased strength, Postural dysfunction, Impaired sensation  Visit Diagnosis: Chronic midline low back pain with right-sided sciatica - Plan: PT plan of care cert/re-cert  Cramp and spasm - Plan: PT plan of care cert/re-cert  Abnormal posture - Plan: PT plan of care cert/re-cert     Problem List Patient Active Problem  List   Diagnosis Date Noted   Other specified cardiac dysrhythmias 04/14/2021   Eczema 04/14/2021   Insomnia, unspecified 04/14/2021   Cervical spondylosis 04/14/2021   Upper airway cough syndrome 03/06/2021   Diarrhea 06/23/2020   Globus pharyngeus 04/23/2018   Gastroesophageal reflux disease without esophagitis 04/23/2018   Essential hypertension 08/23/2017   Dyspnea on exertion 06/12/2017   COPD GOLD III 12/27/2016   Primary open angle glaucoma of both eyes, mild stage 12/24/2016    Baruch Merl, PT 06/28/21 1:47 PM   [facility] @ [place], [place], [address] Phone: [phone]   Fax:  [phone]  Name: Robert Adams MRN: [id number] Date of Birth: 1945/11/17

## 2021-07-05 ENCOUNTER — Encounter: Payer: Self-pay | Admitting: Physical Therapy

## 2021-07-05 ENCOUNTER — Ambulatory Visit: Payer: PPO | Attending: Family Medicine | Admitting: Physical Therapy

## 2021-07-05 ENCOUNTER — Other Ambulatory Visit: Payer: Self-pay

## 2021-07-05 DIAGNOSIS — M5441 Lumbago with sciatica, right side: Secondary | ICD-10-CM | POA: Diagnosis not present

## 2021-07-05 DIAGNOSIS — G8929 Other chronic pain: Secondary | ICD-10-CM | POA: Insufficient documentation

## 2021-07-05 DIAGNOSIS — R252 Cramp and spasm: Secondary | ICD-10-CM | POA: Insufficient documentation

## 2021-07-05 DIAGNOSIS — R293 Abnormal posture: Secondary | ICD-10-CM | POA: Diagnosis present

## 2021-07-05 NOTE — Therapy (Signed)
[facility] @ [place], [place], [address] Phone: [phone]   Fax:  [phone]  Physical Therapy Treatment  Patient Details  Name: Robert Adams MRN: [id number] Date of Birth: 11-Jul-1945 Referring Provider (PT): Hayden Rasmussen, MD   Encounter Date: 07/05/2021   PT End of Session - 07/05/21 1231     Visit Number 2    Date for PT Re-Evaluation 08/23/21    Authorization Type [employer]    Progress Note Due on Visit 10    PT Start Time 4081    PT Stop Time 1322    PT Time Calculation (min) 52 min    Activity Tolerance Patient tolerated treatment well    Behavior During Therapy [facility] for tasks assessed/performed             Past Medical History:  Diagnosis Date   Allergy    seasonal   Anxiety    Arthritis    BPH (benign prostatic hyperplasia)    COPD (chronic obstructive pulmonary disease) ([place])    Depression    GERD (gastroesophageal reflux disease)    Glaucoma    Hypertension    Sleep apnea     Past Surgical History:  Procedure Laterality Date   APPENDECTOMY     COLONOSCOPY     12 yrs ago at least   [place] ing    KNEE ARTHROSCOPY     rt and lt   LEFT HEART CATH AND CORONARY ANGIOGRAPHY N/A 08/05/2017   Procedure: LEFT HEART CATH AND CORONARY ANGIOGRAPHY;  Surgeon: Lorretta Harp, MD;  Location: [facility];  Service: Cardiovascular;  Laterality: N/A;   SHOULDER SURGERY Right    x 2    There were no vitals filed for this visit.   Subjective Assessment - 07/05/21 1232     Subjective Pain is down a little bit.  The exercises are helping.  I still feel like I'm walking on a marble.    Pertinent History PMH: COPD, Hx of smoking (quit 2012), HTN, sleep apnea    Diagnostic tests lumbar xray 2019: mild diffuse degenerative changes throughout, had MRI 2022 of lumbar spine - arthritis throughout    Currently in Pain? Yes    Pain Score 7     Pain Location  Back    Pain Orientation Lower;Right;Left;Mid    Pain Descriptors / Indicators Numbness    Pain Type Chronic pain    Pain Radiating Towards middle toes Rt foot numb and painful    Pain Onset More than a month ago    Pain Frequency Constant    Aggravating Factors  getting out of bed in AM    Pain Relieving Factors hot shower, stretching    Effect of Pain on Daily Activities golf                               [facility] Adult PT Treatment/Exercise - 07/05/21 0001       Exercises   Exercises Lumbar;Knee/Hip;Shoulder      Lumbar Exercises: Stretches   Active Hamstring Stretch 1 rep;30 seconds;Left;Right    Active Hamstring Stretch Limitations add ankle pumps x 20 reps after 30 sec    Single Knee to Chest Stretch 3 reps;10 seconds    Quad Stretch Right;2 reps;30 seconds    Quad Stretch Limitations passive by PT, prone    Piriformis  Stretch Left;Right;1 rep;30 seconds      Lumbar Exercises: Aerobic   Nustep L2 x 4' PT present to discuss plan for session      Lumbar Exercises: Standing   Row Strengthening;Theraband    Theraband Level (Row) Level 4 (Blue)    Row Limitations bil and single arm with rotation x 5 cycles      Knee/Hip Exercises: Stretches   Hip Flexor Stretch Both;2 reps;10 seconds    Hip Flexor Stretch Limitations foot on 2nd step      Shoulder Exercises: Stretch   Other Shoulder Stretches doorway x 3x 10 sec      Modalities   Modalities Moist Heat      Moist Heat Therapy   Number Minutes Moist Heat 10 Minutes    Moist Heat Location Lumbar Spine      Manual Therapy   Manual Therapy Joint mobilization;Soft tissue mobilization    Joint Mobilization UPAs Rt L3-S1 Gr II/III    Soft tissue mobilization lumbar fascia, lumbar paraspinals, Rt gluteals and piriformis after DN              Trigger Point Dry Needling - 07/05/21 0001     Consent Given? Yes    Education Handout Provided Previously provided    Muscles Treated Back/Hip Lumbar  multifidi;Gluteus medius;Piriformis    Dry Needling Comments L3-L5 bil, Rt hip only    Gluteus Medius Response Twitch response elicited;Palpable increased muscle length    Piriformis Response Twitch response elicited;Palpable increased muscle length    Lumbar multifidi Response Palpable increased muscle length                     PT Short Term Goals - 07/05/21 1320       PT SHORT TERM GOAL #1   Title Pt will be ind with initial HEP to target improved flexibility and ROM    Status On-going               PT Long Term Goals - 06/28/21 1342       PT LONG TERM GOAL #1   Title Pt will be ind with advanced HEP and understand how to stretch before playing golf    Time 8    Period Weeks    Status New    Target Date 08/23/21      PT LONG TERM GOAL #2   Title Pt will report reduced pain with daily activities by at least 50%    Baseline pain reaches 8/10    Time 8    Period Weeks    Status New    Target Date 08/23/21      PT LONG TERM GOAL #3   Title Pt will improve LE flexibility to within 25% of WNL to reduce undue strain on spine and pelvis.    Time 8    Period Weeks    Status New    Target Date 08/23/21      PT LONG TERM GOAL #4   Title Pt will achieve trunk ROM of at least 60 deg of flexion and 10 deg of ext for improved functional task performance.    Time 8    Period Weeks    Status New    Target Date 08/23/21      PT LONG TERM GOAL #5   Title Pt will improve FOTO score to at least 59% to demo improved function.    Baseline 54%    Time 8  Period Weeks    Status New    Target Date 08/23/21                   Plan - 07/05/21 1317     Clinical Impression Statement Pt returns for first time follow up visit.  Initial HEP has helped somewhat.  Pt continues to feel like he is walking on a marble under Rt foot and has symptoms of neuropathy in bil LEs.  DN performed today to lumbar multifidi bil and Rt lateral hip.  Twitch/release noted in Rt  hip > lumbar muscles.  Pt with hypomobile mid and lower lumbar facets on Rt > Lt.  PT reviewed HEP stretches and added some standing rows with rotation and hip flexor stretch on stairs today with good tolerance.  Continue along POC.    Comorbidities COPD limits endurance    PT Frequency 1x / week    PT Duration 8 weeks    PT Treatment/Interventions ADLs/Self Care Home Management;Electrical Stimulation;Traction;Functional mobility training;Therapeutic activities;Therapeutic exercise;Neuromuscular re-education;Manual techniques;Patient/family education;Passive range of motion;Dry needling;Joint Manipulations;Spinal Manipulations;Taping;Moist Heat    PT Next Visit Plan f/u on DN #1 to lumbar and Rt hip, continue lumbar mobs, LE stretching, functional mobility and ROM, include golf mobility    PT Home Exercise Plan Access Code: [email or web address]    Consulted and Agree with Plan of Care Patient             Patient will benefit from skilled therapeutic intervention in order to improve the following deficits and impairments:     Visit Diagnosis: Chronic midline low back pain with right-sided sciatica  Cramp and spasm  Abnormal posture     Problem List Patient Active Problem List   Diagnosis Date Noted   Other specified cardiac dysrhythmias 04/14/2021   Eczema 04/14/2021   Insomnia, unspecified 04/14/2021   Cervical spondylosis 04/14/2021   Upper airway cough syndrome 03/06/2021   Diarrhea 06/23/2020   Globus pharyngeus 04/23/2018   Gastroesophageal reflux disease without esophagitis 04/23/2018   Essential hypertension 08/23/2017   Dyspnea on exertion 06/12/2017   COPD GOLD III 12/27/2016   Primary open angle glaucoma of both eyes, mild stage 12/24/2016    Baruch Merl, PT 07/05/21 1:21 PM   [facility] @ [place], [place], [address] Phone: [phone]   Fax:  [phone]  Name: JAIDON ELLERY MRN:  [id number] Date of Birth: 1946-05-31

## 2021-07-07 ENCOUNTER — Other Ambulatory Visit: Payer: Self-pay | Admitting: Internal Medicine

## 2021-07-10 ENCOUNTER — Other Ambulatory Visit: Payer: Self-pay | Admitting: Family Medicine

## 2021-07-10 DIAGNOSIS — R319 Hematuria, unspecified: Secondary | ICD-10-CM

## 2021-07-12 ENCOUNTER — Other Ambulatory Visit: Payer: Self-pay

## 2021-07-12 ENCOUNTER — Encounter: Payer: Self-pay | Admitting: Physical Therapy

## 2021-07-12 ENCOUNTER — Ambulatory Visit: Payer: PPO | Admitting: Physical Therapy

## 2021-07-12 DIAGNOSIS — R293 Abnormal posture: Secondary | ICD-10-CM

## 2021-07-12 DIAGNOSIS — M5441 Lumbago with sciatica, right side: Secondary | ICD-10-CM | POA: Diagnosis not present

## 2021-07-12 DIAGNOSIS — G8929 Other chronic pain: Secondary | ICD-10-CM

## 2021-07-12 DIAGNOSIS — R252 Cramp and spasm: Secondary | ICD-10-CM

## 2021-07-12 NOTE — Therapy (Addendum)
[facility] @ [place], [place], [address] Phone: [phone]   Fax:  [phone]  Physical Therapy Treatment  Patient Details  Name: Robert Adams MRN: [id number] Date of Birth: 1946-02-19 Referring Provider (PT): Hayden Rasmussen, MD   Encounter Date: 07/12/2021   PT End of Session - 07/12/21 1100     Visit Number 3    Date for PT Re-Evaluation 08/23/21    Authorization Type [employer]    Progress Note Due on Visit 10    PT Start Time 1100    PT Stop Time 1140    PT Time Calculation (min) 40 min    Activity Tolerance Patient tolerated treatment well    Behavior During Therapy [facility] for tasks assessed/performed             Past Medical History:  Diagnosis Date   Allergy    seasonal   Anxiety    Arthritis    BPH (benign prostatic hyperplasia)    COPD (chronic obstructive pulmonary disease) ([place])    Depression    GERD (gastroesophageal reflux disease)    Glaucoma    Hypertension    Sleep apnea     Past Surgical History:  Procedure Laterality Date   APPENDECTOMY     COLONOSCOPY     12 yrs ago at least   [place] ing    KNEE ARTHROSCOPY     rt and lt   LEFT HEART CATH AND CORONARY ANGIOGRAPHY N/A 08/05/2017   Procedure: LEFT HEART CATH AND CORONARY ANGIOGRAPHY;  Surgeon: Lorretta Harp, MD;  Location: [place] [facility];  Service: Cardiovascular;  Laterality: N/A;   SHOULDER SURGERY Right    x 2    There were no vitals filed for this visit.   Subjective Assessment - 07/12/21 1100     Subjective I played golf for the first time in 6 weeks x 4.5 hours so I'm a little sore today.    Pertinent History PMH: COPD, Hx of smoking (quit 2012), HTN, sleep apnea    Diagnostic tests lumbar xray 2019: mild diffuse degenerative changes throughout, had MRI 2022 of lumbar spine - arthritis throughout    Currently in Pain? Yes    Pain Score 5     Pain Location Back    Pain  Orientation Lower;Right;Left    Pain Type Chronic pain    Pain Radiating Towards Rt buttock, middle toes Rt foot numb/painful    Pain Onset More than a month ago    Pain Frequency Constant    Aggravating Factors  getting out of bed in AM, playing golf makes me sore    Pain Relieving Factors heat, stretching    Effect of Pain on Daily Activities golf                               [facility] Adult PT Treatment/Exercise - 07/12/21 0001       Exercises   Exercises Lumbar;Knee/Hip;Shoulder      Lumbar Exercises: Stretches   Active Hamstring Stretch 1 rep;30 seconds;Left;Right    Active Hamstring Stretch Limitations add ankle pumps x 20 reps after 30 sec    Single Knee to Chest Stretch 3 reps;10 seconds    Lower Trunk Rotation 2 reps;20 seconds    Hip Flexor Stretch Left;Right;2 reps;20 seconds    Hip Flexor Stretch Limitations foot on 2nd  step    Pension scheme manager reps;5 reps;10 seconds    Quad Stretch Limitations standing foot in chair    Piriformis Stretch Left;Right;1 rep;30 seconds    Other Lumbar Stretch Exercise seated blue ball rollouts x 10      Lumbar Exercises: Aerobic   Nustep L3 x 4' PT present to discuss symptoms      Lumbar Exercises: Chief Strategy Officer;Theraband;5 reps    Theraband Level (Row) Level 4 (Blue)    Row Limitations bil and single arm with rotation x 5 cycles      Lumbar Exercises: Supine   Bridge 10 reps      Lumbar Exercises: Sidelying   Other Sidelying Lumbar Exercises open books x 10 each side      Lumbar Exercises: Quadruped   [place]/Old Horse 10 reps    Other Quadruped Lumbar Exercises child's pose 3-way x 10 sec, 2 rounds      Knee/Hip Exercises: Standing   Functional Squat 10 reps    Functional Squat Limitations 10lb kbell to 8" stool    Other Standing Knee Exercises standing T at counter x 10 Rt/Lt                       PT Short Term Goals - 07/05/21 1320       PT SHORT TERM GOAL #1    Title Pt will be ind with initial HEP to target improved flexibility and ROM    Status On-going               PT Long Term Goals - 06/28/21 1342       PT LONG TERM GOAL #1   Title Pt will be ind with advanced HEP and understand how to stretch before playing golf    Time 8    Period Weeks    Status New    Target Date 08/23/21      PT LONG TERM GOAL #2   Title Pt will report reduced pain with daily activities by at least 50%    Baseline pain reaches 8/10    Time 8    Period Weeks    Status New    Target Date 08/23/21      PT LONG TERM GOAL #3   Title Pt will improve LE flexibility to within 25% of WNL to reduce undue strain on spine and pelvis.    Time 8    Period Weeks    Status New    Target Date 08/23/21      PT LONG TERM GOAL #4   Title Pt will achieve trunk ROM of at least 60 deg of flexion and 10 deg of ext for improved functional task performance.    Time 8    Period Weeks    Status New    Target Date 08/23/21      PT LONG TERM GOAL #5   Title Pt will improve FOTO score to at least 59% to demo improved function.    Baseline 54%    Time 8    Period Weeks    Status New    Target Date 08/23/21                   Plan - 07/12/21 1137     Clinical Impression Statement Session focused on advancing spine ROM in all planes and trunk/LE stretching along with functional mobility such as squat and standing T.  Pt reported good tolerance with  some SOB following squats which quickly recovered.  He has signif lumbar stiffness especially into lumbar extension.  He played golf yesterday and plays again tomorrow.  He continues to experience cold/numb feet bil secondary to neuropathy.  Assess response to ther ex next session to determine readiness to advance HEP.    Comorbidities COPD limits endurance    PT Frequency 1x / week    PT Duration 8 weeks    PT Treatment/Interventions ADLs/Self Care Home Management;Electrical Stimulation;Traction;Functional mobility  training;Therapeutic activities;Therapeutic exercise;Neuromuscular re-education;Manual techniques;Patient/family education;Passive range of motion;Dry needling;Joint Manipulations;Spinal Manipulations;Taping;Moist Heat    PT Next Visit Plan f/u on ther ex from last session, advance HEP as needed, golf mobility, spine/LE stretching    PT Home Exercise Plan Access Code: [email or web address]    Consulted and Agree with Plan of Care Patient             Patient will benefit from skilled therapeutic intervention in order to improve the following deficits and impairments:     Visit Diagnosis: Chronic midline low back pain with right-sided sciatica  Cramp and spasm  Abnormal posture     Problem List Patient Active Problem List   Diagnosis Date Noted   Other specified cardiac dysrhythmias 04/14/2021   Eczema 04/14/2021   Insomnia, unspecified 04/14/2021   Cervical spondylosis 04/14/2021   Upper airway cough syndrome 03/06/2021   Diarrhea 06/23/2020   Globus pharyngeus 04/23/2018   Gastroesophageal reflux disease without esophagitis 04/23/2018   Essential hypertension 08/23/2017   Dyspnea on exertion 06/12/2017   COPD GOLD III 12/27/2016   Primary open angle glaucoma of both eyes, mild stage 12/24/2016    Venetia Night Jabril Pursell, PT 07/12/21 11:42 AM  PHYSICAL THERAPY DISCHARGE SUMMARY  Visits from Start of Care: 3  Current functional level related to goals / functional outcomes: Pt felt that PT had not helped his radicular symptoms and was not interested in continuing PT at this time.  Goals not met given short duration of treatment.   Remaining deficits: See above   Education / Equipment: Initial HEP   Patient agrees to discharge. Patient goals were not met. Patient is being discharged due to the patient's request.  Baruch Merl, PT 07/17/21 5:12 PM  [facility] @ [place], [place], [address] Phone: [phone]    Fax:  [phone]  Name: YOBANI SCHERTZER MRN: [id number] Date of Birth: 09-16-1945

## 2021-07-17 ENCOUNTER — Ambulatory Visit
Admission: RE | Admit: 2021-07-17 | Discharge: 2021-07-17 | Disposition: A | Discharge status: Home / Self Care | Payer: PPO | Source: Ambulatory Visit | Attending: Family Medicine | Admitting: Family Medicine

## 2021-07-17 DIAGNOSIS — R319 Hematuria, unspecified: Secondary | ICD-10-CM

## 2021-07-19 ENCOUNTER — Encounter: Payer: PPO | Admitting: Physical Therapy

## 2021-07-26 ENCOUNTER — Encounter: Payer: PPO | Admitting: Physical Therapy

## 2021-08-02 ENCOUNTER — Encounter: Payer: PPO | Admitting: Physical Therapy

## 2021-08-09 ENCOUNTER — Encounter: Payer: PPO | Admitting: Physical Therapy

## 2021-08-16 ENCOUNTER — Encounter: Payer: PPO | Admitting: Physical Therapy

## 2021-08-23 ENCOUNTER — Encounter: Payer: PPO | Admitting: Physical Therapy

## 2021-10-25 ENCOUNTER — Other Ambulatory Visit: Payer: Self-pay | Admitting: Urology

## 2021-11-03 NOTE — Progress Notes (Signed)
Talked with patient instructions given hx and meds reviewed. Arrival time 1030. Clear liquids until 0830. Solids until 0630. Wife is the driver

## 2021-11-05 NOTE — H&P (Signed)
I have blood in my urine.     10/24/21: Robert Adams returns today with gross hematuria and pain. He has a history of a stone on a prior CT and BPH with BOO. He has intermittent gross hematuria x 3 days with a touch of nausea and some suprapubic pain. He has no dysuria. He has some urgency. He remains on tamsulosin and finasteride. He had another episode of hematuria in 2/23 and had a renal US that showed a possible 44m RLP stone and a 2.5cm right renal cyst.     ALLERGIES: No Allergies    MEDICATIONS: Albuterol Sulfate Hfa 90 mcg hfa aerosol with adapter  Betamethasone Diprop Augmented 0.05 % cream  Clonazepam 0.5 mg tablet,disintegrating  Finasteride TABS Oral  Preservision Areds  Tamsulosin HCl - 0.4 MG Oral Capsule Oral  Trelegy Ellipta  Triamcinolone  Vitamin D3     GU PSH: No GU PSH      PSH Notes: Shoulder Surgery Right, Shoulder Surgery, Appendectomy, Arthroscopy Knee Left, Rotator Cuff Repair, Inguinal Hernia Repair, Arthroscopy Knee Right,    NON-GU PSH: Appendectomy - 2010 Knee Arthroscopy; Dx - 2010, 2010     GU PMH: Dysuria - 2020 Urinary Frequency - 2020 Weak Urinary Stream - 2020 Low back pain, Lower back pain - 2017 Pelvic/perineal pain, Abdominal pain, suprapubic - 2017 BPH w/LUTS, Benign prostatic hyperplasia with urinary obstruction - 2014 Inflammatory Disease Prostate, Unspec, Prostatitis - 2014 Personal Hx Oth male genital organs diseases, History of prostatitis - 2014 Urinary Retention, Unspec, Incomplete bladder emptying - 2014      PMH Notes:  2009-01-05 11:04:04 - Note: Arthritis   NON-GU PMH: Encounter for general adult medical examination without abnormal findings, Encounter for preventive health examination - 2017 Congenital single renal cyst, Congenital renal cyst, single - 2014 Personal history of other mental and behavioral disorders, History of depression - 2014 Personal history of other specified conditions, History of urinary frequency -  2014 Anxiety Arthritis Depression GERD    FAMILY HISTORY: 1 Daughter - Other 1 son - Other Atrial Fibrillation - Brother, Sister Blood In Urine - Grandmother Death In The Family Father - Father Death In The Family Mother - Mother FEast [place]_4__ Living Daughter - Runs In Family Kidney Stones - Mother Pacemaker Placement - Brother Prostate Cancer - Uncle, Father   SOCIAL HISTORY: Marital Status: Married Preferred Language: English; Ethnicity: Not Hispanic Or Latino; Race: White Current Smoking Status: Patient does not smoke anymore. Has not smoked since 10/03/2006. Smoked for 30 years. Smoked 1 pack per day.   Tobacco Use Assessment Completed: Used Tobacco in last 30 days? Does not use smokeless tobacco. Has never drank.  Does not use drugs. Drinks 1 caffeinated drink per day.     Notes: Former smoker, Alcohol Use, Daily Coffee Consumption (___ Cups/Day), Marital History - Currently Married, Retired From Work   REVIEW OF SYSTEMS:    GU Review Male:   Patient reports frequent urination, hard to postpone urination, leakage of urine, stream starts and stops, trouble starting your stream, and erection problems. Patient denies burning/ pain with urination, get up at night to urinate, have to strain to urinate , and penile pain.  Gastrointestinal (Upper):   Patient reports nausea. Patient denies vomiting and indigestion/ heartburn.  Gastrointestinal (Lower):   Patient denies diarrhea and constipation.  Constitutional:   Patient denies fever, night sweats, weight loss, and fatigue.  Skin:   Patient reports skin rash/ lesion and itching.   Eyes:  Patient reports blurred vision. Patient denies double vision.  Ears/ Nose/ Throat:   Patient reports sinus problems. Patient denies sore throat.  Hematologic/Lymphatic:   Patient reports easy bruising. Patient denies swollen glands.  Cardiovascular:   Patient denies leg swelling and chest pains.  Respiratory:   Patient  reports shortness of breath. Patient denies cough.  Endocrine:   Patient denies excessive thirst.  Musculoskeletal:   Patient reports back pain and joint pain.   Neurological:   Patient reports dizziness. Patient denies headaches.  Psychologic:   Patient reports depression and anxiety.    Notes: Weak stream, blood in urine    VITAL SIGNS:      10/24/2021 10:03 AM  Weight 168 lb / 76.2 kg  Height 70 in / 177.8 cm  BP 158/74 mmHg  Heart Rate 52 /min  Temperature 98.0 F / 36.6 C  BMI 24.1 kg/m   GU PHYSICAL EXAMINATION:    Anus and Perineum: No hemorrhoids. No anal stenosis. No rectal fissure, no anal fissure. No edema, no dimple, no perineal tenderness, no anal tenderness.  Scrotum: No lesions. No edema. No cysts. No warts.  Epididymides: Right: no spermatocele, no masses, no cysts, no tenderness, no induration, no enlargement. Left: no spermatocele, no masses, no cysts, no tenderness, no induration, no enlargement.  Testes: Mod varicocele - Gr II left testis. No tenderness, no swelling, no enlargement left testis. No tenderness, no swelling, no enlargement right testis. Normal location left testis. Normal location right testis. No mass, no cyst, no hydrocele left testis. No mass, no cyst, no varicocele, no hydrocele right testis.   Urethral Meatus: Normal size. No lesion, no wart, no discharge, no polyp. Normal location.  Penis: Penis uncircumcised. No foreskin warts, no cracks. No dorsal peyronie's plaques, no left corporal peyronie's plaques, no right corporal peyronie's plaques, no scarring, no shaft warts. No balanitis, no meatal stenosis.   Prostate: Prostate 2 + size. Left lobe normal consistency, right lobe normal consistency. Symmetrical lobes. No prostate nodule. Left lobe no tenderness, right lobe no tenderness.   Seminal Vesicles: Nonpalpable.  Sphincter Tone: Normal sphincter. No rectal tenderness. No rectal mass.    MULTI-SYSTEM PHYSICAL EXAMINATION:    Constitutional:  Well-nourished. No physical deformities. Normally developed. Good grooming.  Neck: Neck symmetrical, not swollen. Normal tracheal position.  Respiratory: Normal breath sounds. No labored breathing, no use of accessory muscles.   Cardiovascular: Regular rate and rhythm. No murmur, no gallop.   Lymphatic: No enlargement, no tenderness of supraclavicular, groin, neck lymph nodes.  Skin: No paleness, no jaundice, no cyanosis. No lesion, no ulcer, no rash.  Neurologic / Psychiatric: Oriented to time, oriented to place, oriented to person. No depression, no anxiety, no agitation.  Gastrointestinal: Abdominal tenderness, mild RLQ. No hernia. No mass, no rigidity, non obese abdomen.   Musculoskeletal: Normal gait and station of head and neck.     Complexity of Data:  Lab Test Review:   BMP  Records Review:   Previous Doctor Records, Previous Patient Records  Urine Test Review:   Urinalysis  X-Ray Review: Renal Ultrasound: Reviewed Films. Reviewed Report. Discussed With Patient. From 2/23.  C.T. Hematuria: Reviewed Films. Reviewed Report. Discussed With Patient.     10/01/11  PSA  Total PSA 0.73     10/24/21  General Chemistry  BUN 15.0 mg/dL  Creatinine 0.8 mg/dL  eGFR African American 99 mL/min  eGFR Non-Afr. American 86 mL/min  Notes:  Cr was normal last July   PROCEDURES:         C.T. Hematuria - 74178, [id number]  He has a 5-73m RUPJ stone with minimal obstruction. No other GU findings noted. A full report is pending.       OMNIPAQUE 300 125CC Patient confirmed No Neulasta OnPro Device.         Urinalysis w/Scope Dipstick Dipstick Cont'd Micro  Color: Amber Bilirubin: Neg mg/dL WBC/hpf: 0 - 5/hpf  Appearance: Slightly Cloudy Ketones: Neg mg/dL RBC/hpf: 20 - 40/hpf  Specific Gravity: 1.025 Blood: 3+ ery/uL Bacteria: Few (10-25/hpf)  pH: 5.5 Protein: Neg mg/dL Cystals: NS (Not Seen)  Glucose: Neg mg/dL Urobilinogen: 0.2 mg/dL Casts: NS (Not Seen)    Nitrites: Neg  Trichomonas: Not Present    Leukocyte Esterase: Neg leu/uL Mucous: Present      Epithelial Cells: NS (Not Seen)      Yeast: NS (Not Seen)      Sperm: Not Present    ASSESSMENT:      ICD-10 Details  1 GU:   Gross hematuria - R31.0 Acute, Threat to Bodily Function - The hematuria appears to be secondary to a 5-612mstone at the right UPJ with mild obstruction. I discussed options including MET, ESWL and URS and will get him set up for ESWL. I reviewed the risks of ESWL including bleeding, infection, injury to the kidney or adjacent structures, failure to fragment the stone, need for ancillary procedures, thrombotic events, cardiac arrhythmias and sedation complications.     2   Renal calculus - N20.0 Chronic, Threat to Bodily Function  3   Renal cyst - N28.1 Chronic, Stable - He has a stable right renal cyst.    PLAN:            Medications Stop Meds: KlonoPIN 0.5 MG Oral Tablet Oral  Start: 01/05/2009  Discontinue: 10/24/2021  - Reason: The medication cycle was completed.  Losartan Potassium 25 mg tablet 1 tablet PO Q HS  Discontinue: 10/24/2021  - Reason: The medication cycle was completed.  Multi-Vitamin TABS Oral  Start: 01/05/2009  Discontinue: 10/24/2021  - Reason: The medication cycle was completed.  Symbicort 160 mcg-4.5 mcg/actuation hfa aerosol with adapter  Discontinue: 10/24/2021  - Reason: The medication cycle was completed.            Orders Labs BUN/Creatinine(Stat)  X-Rays: C.T. Hematuria With and Without I.V. Contrast  X-Ray Notes: History:  Hematuria: Yes/No  Patient to see MD after exam: Yes/No  Previous exam: CT / IVP/ US/ KUB/ None  When:  Where:  Diabetic: Yes/ No  BUN/ Creatinine:  Date of last BUN Creatinine:  Weight in pounds:  Allergy- IV Contrast: Yes/ No  Conflicting diabetic meds: Yes/ No  Diabetic Meds:  Prior Authorization #: [id number]            Schedule Return Visit/Planned Activity: ASAP - Schedule Surgery             Note:  ESWL  Procedure: Unspecified Date - ESWL - 50590, right Notes: Next available.           Document Letter(s):  Created for Patient: Clinical Summary         Notes:   CC: Dr. KaHorald Pollen

## 2021-11-06 ENCOUNTER — Ambulatory Visit (HOSPITAL_COMMUNITY): Payer: PPO

## 2021-11-06 ENCOUNTER — Other Ambulatory Visit: Payer: Self-pay

## 2021-11-06 ENCOUNTER — Encounter (HOSPITAL_BASED_OUTPATIENT_CLINIC_OR_DEPARTMENT_OTHER): Payer: Self-pay | Admitting: Urology

## 2021-11-06 ENCOUNTER — Ambulatory Visit (HOSPITAL_BASED_OUTPATIENT_CLINIC_OR_DEPARTMENT_OTHER)
Admission: RE | Admit: 2021-11-06 | Discharge: 2021-11-06 | Disposition: A | Discharge status: Home / Self Care | Payer: PPO | Attending: Urology | Admitting: Urology

## 2021-11-06 ENCOUNTER — Encounter (HOSPITAL_BASED_OUTPATIENT_CLINIC_OR_DEPARTMENT_OTHER)
Admission: RE | Disposition: A | Discharge status: Home / Self Care | Payer: Self-pay | Source: Home / Self Care | Attending: Urology

## 2021-11-06 DIAGNOSIS — R310 Gross hematuria: Secondary | ICD-10-CM | POA: Insufficient documentation

## 2021-11-06 DIAGNOSIS — Z79899 Other long term (current) drug therapy: Secondary | ICD-10-CM | POA: Insufficient documentation

## 2021-11-06 DIAGNOSIS — N200 Calculus of kidney: Secondary | ICD-10-CM | POA: Insufficient documentation

## 2021-11-06 DIAGNOSIS — N401 Enlarged prostate with lower urinary tract symptoms: Secondary | ICD-10-CM | POA: Diagnosis not present

## 2021-11-06 DIAGNOSIS — N138 Other obstructive and reflux uropathy: Secondary | ICD-10-CM | POA: Diagnosis not present

## 2021-11-06 DIAGNOSIS — Z87891 Personal history of nicotine dependence: Secondary | ICD-10-CM | POA: Insufficient documentation

## 2021-11-06 HISTORY — PX: EXTRACORPOREAL SHOCK WAVE LITHOTRIPSY: SHX1557

## 2021-11-06 SURGERY — LITHOTRIPSY, ESWL
Anesthesia: LOCAL | Laterality: Right

## 2021-11-06 MED ORDER — SODIUM CHLORIDE 0.9 % IV SOLN: INTRAVENOUS | Status: DC

## 2021-11-06 MED ORDER — CIPROFLOXACIN HCL 500 MG PO TABS
ORAL_TABLET | ORAL | Status: AC
  Filled 2021-11-06: qty 1

## 2021-11-06 MED ORDER — DIPHENHYDRAMINE HCL 25 MG PO CAPS
ORAL_CAPSULE | ORAL | Status: AC
  Filled 2021-11-06: qty 1

## 2021-11-06 MED ORDER — DIAZEPAM 5 MG PO TABS
ORAL_TABLET | ORAL | Status: AC
  Filled 2021-11-06: qty 2

## 2021-11-06 MED ORDER — DIAZEPAM 5 MG PO TABS
10.0000 mg | ORAL_TABLET | ORAL | Status: AC
  Administered 2021-11-06: 10 mg via ORAL

## 2021-11-06 MED ORDER — CIPROFLOXACIN HCL 500 MG PO TABS
500.0000 mg | ORAL_TABLET | ORAL | Status: AC
  Administered 2021-11-06: 500 mg via ORAL

## 2021-11-06 MED ORDER — DIPHENHYDRAMINE HCL 25 MG PO CAPS
25.0000 mg | ORAL_CAPSULE | ORAL | Status: AC
  Administered 2021-11-06: 25 mg via ORAL

## 2021-11-06 MED ORDER — OXYCODONE HCL 5 MG PO TABS: 5.0000 mg | ORAL_TABLET | Freq: Four times a day (QID) | ORAL | 0 refills | Status: DC | PRN

## 2021-11-06 MED ORDER — ONDANSETRON HCL 4 MG PO TABS: 4.0000 mg | ORAL_TABLET | Freq: Four times a day (QID) | ORAL | 1 refills | Status: DC | PRN

## 2021-11-06 MED ORDER — SODIUM CHLORIDE 0.9% FLUSH: 3.0000 mL | Freq: Two times a day (BID) | INTRAVENOUS | Status: DC

## 2021-11-06 NOTE — OR Nursing (Signed)
Pt right flank with red markings pinhead in size.  Cdi,normal in appearance for this procedure. Darin Engels rn

## 2021-11-06 NOTE — Discharge Instructions (Signed)

## 2021-11-06 NOTE — Interval H&P Note (Signed)
History and Physical Interval Note:  The stone is now in the lower pole.    11/06/2021 1:53 PM  Robert Adams  has presented today for surgery, with the diagnosis of RIGHT URETERL STONE.  The various methods of treatment have been discussed with the patient and family. After consideration of risks, benefits and other options for treatment, the patient has consented to  Procedure(s): RIGHT EXTRACORPOREAL SHOCK WAVE LITHOTRIPSY (ESWL) (Right) as a surgical intervention.  The patient's history has been reviewed, patient examined, no change in status, stable for surgery.  I have reviewed the patient's chart and labs.  Questions were answered to the patient's satisfaction.     [name]

## 2021-11-06 NOTE — Op Note (Signed)
See PCS note.

## 2021-11-07 ENCOUNTER — Encounter (HOSPITAL_BASED_OUTPATIENT_CLINIC_OR_DEPARTMENT_OTHER): Payer: Self-pay | Admitting: Urology

## 2022-02-06 ENCOUNTER — Other Ambulatory Visit (HOSPITAL_COMMUNITY): Payer: Self-pay

## 2022-02-06 MED ORDER — TRELEGY ELLIPTA 200-62.5-25 MCG/ACT IN AEPB
INHALATION_SPRAY | RESPIRATORY_TRACT | 0 refills | Status: DC
  Filled 2022-02-06: qty 180, 90d supply, fill #0

## 2022-02-07 ENCOUNTER — Other Ambulatory Visit (HOSPITAL_COMMUNITY): Payer: Self-pay

## 2022-02-09 ENCOUNTER — Ambulatory Visit: Payer: PPO | Admitting: Nurse Practitioner

## 2022-02-12 ENCOUNTER — Other Ambulatory Visit (HOSPITAL_COMMUNITY): Payer: Self-pay

## 2022-03-06 ENCOUNTER — Encounter: Payer: Self-pay | Admitting: Internal Medicine

## 2022-03-06 ENCOUNTER — Ambulatory Visit: Payer: PPO | Admitting: Internal Medicine

## 2022-03-06 VITALS — BP 120/80 | HR 64 | Temp 98.0°F | Ht 70.5 in | Wt 170.0 lb

## 2022-03-06 DIAGNOSIS — R0609 Other forms of dyspnea: Secondary | ICD-10-CM

## 2022-03-06 DIAGNOSIS — J449 Chronic obstructive pulmonary disease, unspecified: Secondary | ICD-10-CM | POA: Diagnosis not present

## 2022-03-06 DIAGNOSIS — R058 Other specified cough: Secondary | ICD-10-CM | POA: Diagnosis not present

## 2022-03-06 MED ORDER — TRAMADOL HCL 50 MG PO TABS: 50.0000 mg | ORAL_TABLET | ORAL | 0 refills | Status: AC | PRN

## 2022-03-06 NOTE — Patient Instructions (Signed)
Take mucinex dm 1200 mg  every 12 hours and supplement if needed with  tramadol 50 mg up to 1- 2 every 4 hours to suppress the urge to cough. Swallowing water and/or using ice chips/non mint and menthol containing candies (such as lifesavers or sugarless jolly ranchers) are also effective.  You should rest your voice and avoid activities that you know make you cough.  Once you have eliminated the cough for 3 straight days try reducing the tramadol first,  then the mucinex dm as tolerated.    Stop trelegy until cough is gone and use the nebulizer up to every 4 hours as needed   Try prilosec otc '20mg'$   Take 30-60 min before first meal of the day and Pepcid ac (famotidine) 20 mg one @  bedtime until cough is completely gone for at least a week without the need for cough suppression   GERD (REFLUX)  is an extremely common cause of respiratory symptoms just like yours , many times with no obvious heartburn at all.    It can be treated with medication, but also with lifestyle changes including elevation of the head of your bed (ideally with 6 -8inch blocks under the headboard of your bed),  Smoking cessation, avoidance of late meals, excessive alcohol, and avoid fatty foods, chocolate, peppermint, colas, red wine, and acidic juices such as orange juice.  NO MINT OR MENTHOL PRODUCTS SO NO COUGH DROPS  USE SUGARLESS CANDY INSTEAD (Jolley ranchers or Stover's or Life Savers) or even ice chips will also do - the key is to swallow to prevent all throat clearing. NO OIL BASED VITAMINS - use powdered substitutes.  Avoid fish oil when coughing.   Please schedule a follow up office visit in 6 weeks, call sooner if needed with pfts

## 2022-03-06 NOTE — Progress Notes (Unsigned)
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 Claris Adams. Pt c/o SOB x 4 months. He states he gets SOB if he walks too fast, walks up an incline or does "too much work".    doe = MMRC1 = can walk nl pace, flat grade, can't hurry or go up hills or steps s sob mb and back 200 ft slt grade walking back to house but not so sob he has to stop when gets back  Some better after saba/ no need at rest or noct / now rarely using saba  Rec Plan A = Automatic if having bad enough breathing to justify it = symbicort 160 Take 2 puffs first thing in am and then another 2 puffs about 12 hours later.  Work on inhaler technique:    Plan B = Backup Only use your albuterol (proair) as a rescue medication   03/06/2021  re-establish COPD GOLD 3/Robert Adams re: new doe    maint on trelegy 200 with lots of throat clearing "all his life'  Chief Complaint  Patient presents with   Pulmonary Consult    Referred by Dr Robert Adams. Pt seen in the past- last in 2018. He c/o increased DOE x 5 wks- noticed while golfing. He gets winded walking up stairs or lifting things. Pred has helped some- currently on taper. He is using his albuterol inhaler 1-2 x per day.   Dyspnea:  mb to house is a struggle but can still do it  Cough: hoarse/ throating worse since breathing got wors  Sleeping: mild elevation electric bed  SABA use: not helping nor is trelegy  02: none S Covid status:   never vaccinated / had 1st wave  Rec Plan A = Automatic = Always= Symbicort 80 Take 2 puffs first  thing in am and then another 2 puffs about 12 hours later.  Work on inhaler technique:  Pantoprazole (protonix) 40 mg   Take  30-60 min before first meal of the day and Pepcid (famotidine)  20 mg after supper until return to office -   Plan B = Backup (to supplement plan A, not to replace it) Only use your albuterol inhaler as a rescue medication Plan C = Crisis (instead of Plan B but only if Plan B stops working) - only use your albuterol nebulizer if you first try Plan B and it fails to help > ok to use the nebulizer up to every 4 hours but if start needing it regularly call for immediate appointment GERD diet reviewed, bed blocks rec  Please schedule a follow up office visit in 4 weeks, sooner if needed - bring your inhalers and solutions     03/31/2021  f/u ov/Robert Adams re: GOLD 3 copd/ throat clearing   maint on symb 80/ gerd rx and no better   Chief Complaint  Patient presents with   Follow-up    Breathing is unchanged since the last visit. He has cough with clear  sputum past 2-3 wks. He rarely uses his albuterol inhaler or neb.   Dyspnea:  mb to house uphill 200 ft and has to stop  Cough: throat clearing no better on hard candy  Sleeping: electric slt elevation does fine s resp cc  SABA use: rarely / does not really help but does not prechallenge  02: none  Covid status:   vax x none,  covid 2 y prior to ov - probably the alpha  Rec Please remember to go to the lab department   for your tests - we will call you with the results when they are available. Ok to stop reflux medication  Ok to try albuterol 15 min before an activity (on alternating days)  that you know would usually make you short of breath   Make sure you check your oxygen saturation at your highest level of activity to be sure it stays over 90%  Please schedule a follow up office visit in 6 weeks with PFTs > not done    03/06/2022  ACUTE ov/Robert Adams re: GOLD 3    maint on trelegy  and rarely saba Chief Complaint  Patient  presents with   Follow-up    He was at urgent care 2 times this weekend and though he had a flare of diverticulitis. He is on prednisone, doxycyline,   Dyspnea:  worse since 9/28 ST/ severe cough all clear phlegm> seen 29th and 30thsevere cough > rx promethazine no better Cough: rattling but no production  Sleeping: bothered by cough  SABA use: not much 02: none  Covid status:   never    No obvious day to day or daytime variability or assoc excess/ purulent sputum or mucus plugs or hemoptysis or cp or chest tightness, subjective wheeze or overt sinus or hb symptoms.     Also denies any obvious fluctuation of symptoms with weather or environmental changes or other aggravating or alleviating factors except as outlined above   No unusual exposure hx or h/o childhood pna/ asthma or knowledge of premature birth.  Current Allergies, Complete Past Medical History, Past Surgical History, Family History, and Social History were reviewed in [employer] record.  ROS  The following are not active complaints unless bolded Hoarseness, sore throat, dysphagia, dental problems, itching, sneezing,  nasal congestion or discharge of excess mucus or purulent secretions, ear ache,   fever, chills, sweats, unintended wt loss or wt gain, classically pleuritic or exertional cp,  orthopnea pnd or arm/hand swelling  or leg swelling, presyncope, palpitations, abdominal pain, anorexia, nausea, vomiting, diarrhea  or change in bowel habits or change in bladder habits, change in stools or change in urine, dysuria, hematuria,  rash, arthralgias, visual complaints, headache, numbness, weakness or ataxia or problems with walking or coordination,  change in mood or  memory.        Current Meds  Medication Sig   albuterol (PROVENTIL HFA;VENTOLIN HFA) 108 (90 Base) MCG/ACT inhaler Inhale 2 puffs into the lungs every 6 (six) hours as needed for wheezing or shortness of breath.    augmented betamethasone  dipropionate (DIPROLENE-AF) 0.05 % cream APPLY SMALL AMOUNT TO AFFECTED AREA TWICE A DAY   clonazePAM (KLONOPIN) 1 MG tablet Take 1 mg by mouth at bedtime.   cyanocobalamin 2000 MCG tablet Take 2,500 mcg by mouth daily. 2,536mg   finasteride (PROSCAR) 5 MG tablet Take 5 mg by mouth every evening.   Fluticasone-Umeclidin-Vilant (TRELEGY ELLIPTA) 200-62.5-25 MCG/ACT AEPB Inhale 1 puff by mouth into the  lungs daily   ipratropium-albuterol (DUONEB) 0.5-2.5 (3) MG/3ML SOLN Take 3 mLs by nebulization every 6 (six) hours as needed.   Multiple Vitamins-Minerals (PRESERVISION AREDS 2 PO) Take 1 capsule by mouth daily.   ondansetron (ZOFRAN) 4 MG tablet Take 1 tablet (4 mg total) by mouth 4 (four) times daily as needed for nausea or vomiting.   pantoprazole (PROTONIX) 40 MG tablet Take 1 tablet (40 mg total) by mouth daily. Take 30-60 min before first meal of the day   Tamsulosin HCl (FLOMAX) 0.4 MG CAPS Take 0.4 mg by mouth at bedtime.   Vitamin D, Ergocalciferol, (DRISDOL) 1.25 MG (50000 UNIT) CAPS capsule Take 50,000 Units by mouth once a week.   [DISCONTINUED] budesonide-formoterol (SYMBICORT) 80-4.5 MCG/ACT inhaler Take 2 puffs first thing in am and then another 2 puffs about 12 hours later.   [DISCONTINUED] oxyCODONE (ROXICODONE) 5 MG immediate release tablet Take 1 tablet (5 mg total) by mouth every 6 (six) hours as needed for severe pain.                         Objective:   Physical Exam    Wts  03/06/2022        170 03/31/2021     166  03/06/2021       164   12/27/16 167 lb (75.8 kg)  10/22/16 165 lb (74.8 kg)  06/24/16 160 lb (72.6 kg)    Vital signs reviewed  03/06/2022  - Note at rest 02 sats  97% on RA   General appearance:    amb wm severe coughing fits  HEENT : Oropharynx  clear   Nasal turbinates nl    NECK :  without  apparent JVD/ palpable Nodes/TM    LUNGS: no acc muscle use,  Mild barrel  contour chest wall with bilateral  Distant bs s audible wheeze and   without cough on insp or exp maneuvers  and mild  Hyperresonant  to  percussion bilaterally     CV:  RRR  no s3 or murmur or increase in P2, and no edema   ABD:  soft and nontender with pos end  insp Hoover's  in the supine position.  No bruits or organomegaly appreciated   MS:  Nl gait/ ext warm without deformities Or obvious joint restrictions  calf tenderness, cyanosis or clubbing     SKIN: warm and dry without lesions    NEURO:  alert, approp, nl sensorium with  no motor or cerebellar deficits apparent.              Assessment:

## 2022-03-07 ENCOUNTER — Encounter: Payer: Self-pay | Admitting: Internal Medicine

## 2022-03-07 NOTE — Assessment & Plan Note (Addendum)
Quit smoking  2012  - Spirometry 12/27/2016  FEV1 1.45 (45%)  Ratio 47  With no rx prior  - 03/06/2021  D/c trelegy 200 and trial of symb 80 2bid with prn saba  - 03/06/2021   Walked on RA x  3  lap(s) =  approx 750 @ fast pace, stopped due to sob with lowest 02 sats 89% - 03/13/21  CTa  Neg x for ASCVD/ emphysema  - Labs ordered 03/31/2021  :  allergy profile IgE  20  Eos 0.1    alpha one AT phenotype  MZ / level 94 so rec pft then consider replacement rx   Acute flare in setting of uri with neg covid testing - will need to hold trelegy for now and just use neb due to severe cough, then ok to resume or consider change to breztri   F/u in 6 weeks with pfts ? Needs infusion therapy

## 2022-03-07 NOTE — Assessment & Plan Note (Addendum)
Onset "all his life'  (son has it too) - no response to gerd rx so d/c p 6 weeks 03/31/2021  - flare x mid sept 2023 related to uri while on trelegy   Upper airway cough syndrome (previously labeled PNDS),  is so named because it's frequently impossible to sort out how much is  CR/sinusitis with freq throat clearing (which can be related to primary GERD)   vs  causing  secondary (" extra esophageal")  GERD from wide swings in gastric pressure that occur with throat clearing, often  promoting self use of mint and menthol lozenges that reduce the lower esophageal sphincter tone and exacerbate the problem further in a cyclical fashion.   These are the same pts (now being labeled as having "irritable larynx syndrome" by some cough centers) who not infrequently have a history of having failed to tolerate ace inhibitors,  dry powder inhalers or biphosphonates or report having atypical/extraesophageal reflux symptoms that don't respond to standard doses of PPI  and are easily confused as having aecopd or asthma flares by even experienced allergists/ pulmonologists (myself included).   Of the three most common causes of  Sub-acute / recurrent or chronic cough, only one (GERD)  can actually contribute to/ trigger  the other two (asthma and post nasal drip syndrome)  and perpetuate the cylce of cough.  While not intuitively obvious, many patients with chronic low grade reflux do not cough until there is a primary insult that disturbs the protective epithelial barrier and exposes sensitive nerve endings.   This is typically viral but can due to PNDS and  either may apply here.   The point is that once this occurs, it is difficult to eliminate the cycle  using anything but a maximally effective acid suppression regimen at least in the short run, accompanied by an appropriate diet to address non acid GERD and control / eliminate the cough itself for at least 3 days with tramfadol and regroup in 6 weeks, sooner if  needed          Each maintenance medication was reviewed in detail including emphasizing most importantly the difference between maintenance and prns and under what circumstances the prns are to be triggered using an action plan format where appropriate.  Total time for H and P, chart review, counseling, reviewing hfa/neb/dpi  device(s) and generating customized AVS unique to this acute  office visit / same day charting = 38 min

## 2022-03-13 ENCOUNTER — Ambulatory Visit: Payer: PPO | Admitting: Gastroenterology

## 2022-04-19 ENCOUNTER — Encounter: Payer: Self-pay | Admitting: Gastroenterology

## 2022-04-19 ENCOUNTER — Ambulatory Visit: Payer: PPO | Admitting: Gastroenterology

## 2022-04-19 VITALS — BP 118/68 | HR 81 | Ht 70.5 in | Wt 166.0 lb

## 2022-04-19 DIAGNOSIS — R1030 Lower abdominal pain, unspecified: Secondary | ICD-10-CM

## 2022-04-19 MED ORDER — HYOSCYAMINE SULFATE SL 0.125 MG SL SUBL: SUBLINGUAL_TABLET | SUBLINGUAL | 1 refills | Status: DC

## 2022-04-19 NOTE — Progress Notes (Signed)
04/19/2022 Robert Adams [id number] 1946-01-18   HISTORY OF PRESENT ILLNESS: This is a 76 year old male who is a patient of Dr. Tarri Glenn.  He has been seen here in the past for issues with clearing his throat and mucus.  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  He was told to follow-up with ENT.  He is on once daily PPI therapy.  He tells me that he never really had any classic heartburn or reflux symptoms even before starting the PPI therapy and he certainly does not have any now.  He says the throat clearing had been lifelong, but is improved recently with some medicines that were given to him by pulmonary.  He says that he did see ENT and they said they did not see anything wrong.  He tells me that every once in a while, maybe twice a month he will get some sharp lower abdominal pains.  He says that it usually occurs in the evenings and so then he will go to lay down and once he wakes up in the morning it is gone.  He tells me that him and his wife have been doing combination of a small amount of olive oil, water, and mother's vinegar (drinking it), which he thinks has helped with a lot of his symptoms.  He says that he moves his bowels well.  Colonoscopy last year showed diverticulosis and 1 polyp.  He says that he is here today at the request of his wife, but overall he feels well.   Past Medical History:  Diagnosis Date   Allergy    seasonal   Anxiety    Arthritis    BPH (benign prostatic hyperplasia)    COPD (chronic obstructive pulmonary disease) (HCC)    Depression    GERD (gastroesophageal reflux disease)    Glaucoma    Hypertension    Sleep apnea    Past Surgical History:  Procedure Laterality Date   APPENDECTOMY     COLONOSCOPY     12 yrs ago at least   [facility] LITHOTRIPSY Right 11/06/2021   Procedure: RIGHT EXTRACORPOREAL SHOCK WAVE  LITHOTRIPSY (ESWL);  Surgeon: [name], MD;  Location: [facility];  Service: Urology;  Laterality: Right;   HERNIA REPAIR     bilat ing    KNEE ARTHROSCOPY     rt and lt   LEFT HEART CATH AND CORONARY ANGIOGRAPHY N/A 08/05/2017   Procedure: LEFT HEART CATH AND CORONARY ANGIOGRAPHY;  Surgeon: Lorretta Harp, MD;  Location: [facility];  Service: Cardiovascular;  Laterality: N/A;   SHOULDER SURGERY Right    x 2    reports that he quit smoking about 11 years ago. His smoking use included cigarettes. He has a 30.00 pack-year smoking history. He has never used smokeless tobacco. He reports that he does not drink alcohol and does not use drugs. family history includes Colitis in his mother; Colon polyps (age of onset: 44) in his mother; Emphysema in his father; Heart disease in his brother and sister; Liver disease in his brother; Rheum arthritis in his mother. Allergies  Allergen Reactions   Grass Extracts [Gramineae Pollens]     Other reaction(s): Other (See Comments) Sneezing      Outpatient Encounter Medications as of 04/19/2022  Medication Sig   albuterol (PROVENTIL HFA;VENTOLIN HFA) 108 (90 Base) MCG/ACT inhaler Inhale 2  puffs into the lungs every 6 (six) hours as needed for wheezing or shortness of breath.    augmented betamethasone dipropionate (DIPROLENE-AF) 0.05 % cream APPLY SMALL AMOUNT TO AFFECTED AREA TWICE A DAY   clonazePAM (KLONOPIN) 1 MG tablet Take 1 mg by mouth at bedtime.   cyanocobalamin 2000 MCG tablet Take 2,500 mcg by mouth daily. 2,556mg   finasteride (PROSCAR) 5 MG tablet Take 5 mg by mouth every evening.   Fluticasone-Umeclidin-Vilant (TRELEGY ELLIPTA) 200-62.5-25 MCG/ACT AEPB Inhale 1 puff by mouth into the lungs daily   ipratropium-albuterol (DUONEB) 0.5-2.5 (3) MG/3ML SOLN Take 3 mLs by nebulization every 6 (six) hours as needed.   Multiple Vitamins-Minerals (PRESERVISION AREDS 2 PO) Take 1 capsule by mouth daily.   pantoprazole  (PROTONIX) 40 MG tablet Take 1 tablet (40 mg total) by mouth daily. Take 30-60 min before first meal of the day   Tamsulosin HCl (FLOMAX) 0.4 MG CAPS Take 0.4 mg by mouth at bedtime.   Vitamin D, Ergocalciferol, (DRISDOL) 1.25 MG (50000 UNIT) CAPS capsule Take 50,000 Units by mouth once a week.   [DISCONTINUED] ondansetron (ZOFRAN) 4 MG tablet Take 1 tablet (4 mg total) by mouth 4 (four) times daily as needed for nausea or vomiting.   No facility-administered encounter medications on file as of 04/19/2022.     REVIEW OF SYSTEMS  : All other systems reviewed and negative except where noted in the History of Present Illness.   PHYSICAL EXAM: BP 118/68   Pulse 81   Ht 5' 10.5" (1.791 m)   Wt 166 lb (75.3 kg)   BMI 23.48 kg/m  General: Well developed white male in no acute distress Head: Normocephalic and atraumatic Eyes:  Sclerae anicteric, conjunctiva pink. Ears: Normal auditory acuity Lungs: Clear throughout to auscultation; no W/R/R. Heart: Regular rate and rhythm; no M/R/G. Abdomen: Soft, non-distended.  BS present.  Non-tender. Musculoskeletal: Symmetrical with no gross deformities  Skin: No lesions on visible extremities Extremities: No edema  Neurological: Alert oriented x 4, grossly non-focal Psychological:  Alert and cooperative. Normal mood and affect  ASSESSMENT AND PLAN: *Intermittent lower abdominal pain: Says that it occurs maybe a couple of times a month.  It usually happens in the evenings and then he will go to sleep and  the time he wakes up in the morning it is gone.  It is likely gas or intestinal spasm/cramping.  We will give some Levsin to try on an as-needed basis.  Prescription sent to pharmacy.  Colonoscopy last year with one polyp and diverticulosis. *Throat clearing: This is apparently been a lifelong issue.  He says it is much improved recently with some medicines given to him by pulmonary.  He is on once daily PPI therapy, but never had any classic reflux  symptoms prior to being put on that medication.  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  He was advised to take caution with swallowing pills, take 1 at a time, etc.   CC:  RDarron Doom KMaebelle Munroe MD

## 2022-04-19 NOTE — Patient Instructions (Signed)
_______________________________________________________  If you are age 78 or older, your body mass index should be between 23-30. Your Body mass index is 23.48 kg/m. If this is out of the aforementioned range listed, please consider follow up with your Primary Care Provider.  If you are age 74 or younger, your body mass index should be between 19-25. Your Body mass index is 23.48 kg/m. If this is out of the aformentioned range listed, please consider follow up with your Primary Care Provider.   ________________________________________________________  The [facility] providers would like to encourage you to use [facility] to communicate with providers for non-urgent requests or questions.  Due to long hold times on the telephone, sending your provider a message by [facility] may be a faster and more efficient way to get a response.  Please allow 48 business hours for a response.  Please remember that this is for non-urgent requests.  _______________________________________________________  We have sent the following medications to your pharmacy for you to pick up at your convenience:  Levsin

## 2022-05-13 NOTE — Progress Notes (Deleted)
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 [name]. Pt c/o SOB x 4 months. He states he gets SOB if he walks too fast, walks up an incline or does "too much work".    doe = MMRC1 = can walk nl pace, flat grade, can't hurry or go up hills or steps s sob mb and back 200 ft slt grade walking back to house but not so sob he has to stop when gets back  Some better after saba/ no need at rest or noct / now rarely using saba  Rec Plan A = Automatic if having bad enough breathing to justify it = symbicort 160 Take 2 puffs first thing in am and then another 2 puffs about 12 hours later.  Work on inhaler technique:    Plan B = Backup Only use your albuterol (proair) as a rescue medication   03/06/2021  re-establish COPD GOLD 3/Deja Pisarski re: new doe    maint on trelegy 200 with lots of throat clearing "all his life'  Chief Complaint  Patient presents with   Pulmonary Consult    Referred by Dr [name]. Pt seen in the past- last in 2018. He c/o increased DOE x 5 wks- noticed while golfing. He gets winded walking up stairs or lifting things. Pred has helped some- currently on taper. He is using his albuterol inhaler 1-2 x per day.   Dyspnea:  mb to house is a struggle but can still do it  Cough: hoarse/ throating worse since breathing got wors  Sleeping: mild elevation electric bed  SABA use: not helping nor is trelegy  02: none S Covid status:   never vaccinated / had 1st wave  Rec Plan A = Automatic = Always= Symbicort 80 Take 2 puffs first  thing in am and then another 2 puffs about 12 hours later.  Work on inhaler technique:  Pantoprazole (protonix) 40 mg   Take  30-60 min before first meal of the day and Pepcid (famotidine)  20 mg after supper until return to office -   Plan B = Backup (to supplement plan A, not to replace it) Only use your albuterol inhaler as a rescue medication Plan C = Crisis (instead of Plan B but only if Plan B stops working) - only use your albuterol nebulizer if you first try Plan B and it fails to help > ok to use the nebulizer up to every 4 hours but if start needing it regularly call for immediate appointment GERD diet reviewed, bed blocks rec  Please schedule a follow up office visit in 4 weeks, sooner if needed - bring your inhalers and solutions     03/31/2021  f/u ov/[name] re: GOLD 3 copd/ throat clearing   maint on symb 80/ gerd rx and no better   Chief Complaint  Patient presents with   Follow-up    Breathing is unchanged since the last visit. He has cough with clear sputum  past 2-3 wks. He rarely uses his albuterol inhaler or neb.   Dyspnea:  mb to house uphill 200 ft and has to stop  Cough: throat clearing no better on hard candy  Sleeping: electric slt elevation does fine s resp cc  SABA use: rarely / does not really help but does not prechallenge  02: none  Covid status:   vax x none,  covid 2 y prior to ov - probably the alpha  Rec Please remember to go to the lab department   for your tests - we will call you with the results when they are available. Ok to stop reflux medication  Ok to try albuterol 15 min before an activity (on alternating days)  that you know would usually make you short of breath   Make sure you check your oxygen saturation at your highest level of activity to be sure it stays over 90%  Please schedule a follow up office visit in 6 weeks with PFTs > not done    03/06/2022  ACUTE ov/[name] re: GOLD 3    maint on trelegy  and rarely saba Chief Complaint  Patient  presents with   Follow-up    He was at urgent care 2 times this weekend and though he had a flare of diverticulitis. He is on prednisone, doxycyline,   Dyspnea:  worse since 9/28 ST/ severe cough all clear phlegm> seen 29th and 30thsevere cough > rx promethazine no better Cough: rattling but no production  Sleeping: bothered by cough  SABA use: not much 02: none  Covid status:   never  Rec Take mucinex dm 1200 mg  every 12 hours and supplement if needed with  tramadol 50 mg up to 1- 2 every 4 hours   Stop trelegy until cough is gone and use the nebulizer up to every 4 hours as needed  Try prilosec otc '20mg'$   Take 30-60 min before first meal of the day and Pepcid ac (famotidine) 20 mg one @  bedtime until cough is completely gone for at least a week without  GERD diet reviewed, bed blocks rec   Please schedule a follow up office visit in 6 weeks, call sooner if needed with pfts    05/14/2022  f/u ov/[name] re: ***   maint on ***  No chief complaint on file.   Dyspnea:  *** Cough: *** Sleeping: *** SABA use: *** 02: *** Covid status:   *** Lung cancer screening :  ***    No obvious day to day or daytime variability or assoc excess/ purulent sputum or mucus plugs or hemoptysis or cp or chest tightness, subjective wheeze or overt sinus or hb symptoms.   *** without nocturnal  or early am exacerbation  of respiratory  c/o's or need for noct saba. Also denies any obvious fluctuation of symptoms with weather or environmental changes or other aggravating or alleviating factors except as outlined above   No unusual exposure hx or h/o childhood pna/ asthma or knowledge of premature birth.  Current Allergies, Complete Past Medical History, Past Surgical History, Family History, and Social History were reviewed in [employer] record.  ROS  The following are not active complaints unless bolded Hoarseness, sore throat, dysphagia, dental problems, itching, sneezing,  nasal  congestion or discharge of excess mucus or purulent secretions, ear ache,   fever, chills, sweats, unintended wt loss or wt gain, classically pleuritic or exertional cp,  orthopnea pnd or arm/hand swelling  or leg swelling,  presyncope, palpitations, abdominal pain, anorexia, nausea, vomiting, diarrhea  or change in bowel habits or change in bladder habits, change in stools or change in urine, dysuria, hematuria,  rash, arthralgias, visual complaints, headache, numbness, weakness or ataxia or problems with walking or coordination,  change in mood or  memory.        No outpatient medications have been marked as taking for the 05/14/22 encounter (Appointment) with Tanda Rockers, MD.                            Objective:   Physical Exam    Wts  05/14/2022       ***  03/06/2022        170 03/31/2021     166  03/06/2021       164   12/27/16 167 lb (75.8 kg)  10/22/16 165 lb (74.8 kg)  06/24/16 160 lb (72.6 kg)    Vital signs reviewed  05/14/2022  - Note at rest 02 sats  ***% on ***   General appearance:    ***      Mild bar***               Assessment:

## 2022-05-14 ENCOUNTER — Ambulatory Visit: Payer: PPO | Admitting: Internal Medicine

## 2022-05-31 NOTE — Progress Notes (Signed)
Reviewed and agree with management plans. ? ?Teylor Wolven L. Zebulin Siegel, MD, MPH  ?

## 2022-06-27 ENCOUNTER — Ambulatory Visit (INDEPENDENT_AMBULATORY_CARE_PROVIDER_SITE_OTHER): Payer: PPO | Admitting: Internal Medicine

## 2022-06-27 ENCOUNTER — Encounter: Payer: Self-pay | Admitting: Internal Medicine

## 2022-06-27 VITALS — BP 158/78 | HR 76 | Temp 97.9°F | Ht 70.0 in | Wt 168.0 lb

## 2022-06-27 DIAGNOSIS — R058 Other specified cough: Secondary | ICD-10-CM | POA: Diagnosis not present

## 2022-06-27 DIAGNOSIS — J449 Chronic obstructive pulmonary disease, unspecified: Secondary | ICD-10-CM

## 2022-06-27 LAB — PULMONARY FUNCTION TEST
DL/VA % pred: 54 %
DL/VA: 2.12 ml/min/mmHg/L
DLCO cor % pred: 40 %
DLCO cor: 9.95 ml/min/mmHg
DLCO unc % pred: 40 %
DLCO unc: 9.98 ml/min/mmHg
FEF 25-75 Pre: 0.49 L/sec
FEF2575-%Pred-Pre: 24 %
FEV1-%Pred-Pre: 48 %
FEV1-Pre: 1.4 L
FEV1FVC-%Pred-Pre: 69 %
FEV6-%Pred-Pre: 69 %
FEV6-Pre: 2.64 L
FEV6FVC-%Pred-Pre: 100 %
FVC-%Pred-Pre: 69 %
FVC-Pre: 2.82 L
Pre FEV1/FVC ratio: 50 %
Pre FEV6/FVC Ratio: 94 %
RV % pred: 146 %
RV: 3.87 L
TLC % pred: 98 %
TLC: 6.97 L

## 2022-06-27 MED ORDER — TRELEGY ELLIPTA 100-62.5-25 MCG/ACT IN AEPB: 1.0000 | INHALATION_SPRAY | Freq: Every morning | RESPIRATORY_TRACT | 0 refills | Status: DC

## 2022-06-27 MED ORDER — TRELEGY ELLIPTA 100-62.5-25 MCG/ACT IN AEPB: INHALATION_SPRAY | RESPIRATORY_TRACT | 3 refills | Status: DC

## 2022-06-27 NOTE — Progress Notes (Signed)
PFT done today. 

## 2022-06-27 NOTE — Assessment & Plan Note (Addendum)
Quit smoking  2012 /MZ - Spirometry 12/27/2016  FEV1 1.45 (45%)  Ratio 47  With no rx prior  - 03/06/2021  D/c trelegy 200 and trial of symb 80 2bid with prn saba  - 03/06/2021   Walked on RA x  3  lap(s) =  approx 750 @ fast pace, stopped due to sob with lowest 02 sats 89% - 03/13/21  CTa  Neg x for ASCVD/ emphysema  - Labs ordered 03/31/2021  :  allergy profile IgE  20  Eos 0.1    alpha one AT phenotype  MZ / level 94 so rec pft then consider replacement rx  - PFT's  06/27/2022   FEV1 1.40 (48 % ) ratio 0.50   p Trelegy 100/saba prior to study with DLCO  9.98 (40%)   and FV curve classically concave      Group D (now reclassified as E) in terms of symptom/risk and laba/lama/ICS  therefore appropriate rx at this point >>>  continue trelegy but use the 100 for copd with less risk of upper airway irritation/ lower airway infection , plus prn saba  No change FEV! X > 5 years so no indication for replacement rx for alpha one  F/u can be in 6 m, sooner prn         Each maintenance medication was reviewed in detail including emphasizing most importantly the difference between maintenance and prns and under what circumstances the prns are to be triggered using an action plan format where appropriate.  Total time for H and P, chart review, counseling, reviewing hfa /DPI  device(s) and generating customized AVS unique to this office visit / same day charting = 30 min

## 2022-06-27 NOTE — Patient Instructions (Signed)
Plan A = Automatic = Always=    Trelegy 876 one click each am   Plan B = Backup (to supplement plan A, not to replace it) Only use your albuterol inhaler as a rescue medication to be used if you can't catch your breath by resting or doing a relaxed purse lip breathing pattern.  - The less you use it, the better it will work when you need it. - Ok to use the inhaler up to 2 puffs  every 4 hours if you must but call for appointment if use goes up over your usual need - Don't leave home without it !!  (think of it like the spare tire for your car)   Plan C = Crisis (instead of Plan B but only if Plan B stops working) - only use your albuterol nebulizer if you first try Plan B and it fails to help > ok to use the nebulizer up to every 4 hours but if start needing it regularly call for immediate appointment   Plan D = Doctor - call me if B and C not adequate    Please schedule a follow up visit in 6 months but call sooner if needed

## 2022-06-27 NOTE — Progress Notes (Signed)
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 [name]. Pt c/o SOB x 4 months. He states he gets SOB if he walks too fast, walks up an incline or does "too much work".    doe = MMRC1 = can walk nl pace, flat grade, can't hurry or go up hills or steps s sob mb and back 200 ft slt grade walking back to house but not so sob he has to stop when gets back  Some better after saba/ no need at rest or noct / now rarely using saba  Rec Plan A = Automatic if having bad enough breathing to justify it = symbicort 160 Take 2 puffs first thing in am and then another 2 puffs about 12 hours later.  Work on inhaler technique:    Plan B = Backup Only use your albuterol (proair) as a rescue medication   03/06/2021  re-establish COPD GOLD 3/Robert Adams re: new doe    maint on trelegy 200 with lots of throat clearing "all his life'  Chief Complaint  Patient presents with   Pulmonary Consult    Referred by Dr [name]. Pt seen in the past- last in 2018. He c/o increased DOE x 5 wks- noticed while golfing. He gets winded walking up stairs or lifting things. Pred has helped some- currently on taper. He is using his albuterol inhaler 1-2 x per day.   Dyspnea:  mb to house is a struggle but can still do it  Cough: hoarse/ throating worse since breathing got wors  Sleeping: mild elevation electric bed  SABA use: not helping nor is trelegy  02: none S Covid status:   never vaccinated / had 1st wave  Rec Plan A = Automatic = Always= Symbicort 80 Take 2 puffs first  thing in am and then another 2 puffs about 12 hours later.  Work on inhaler technique:  Pantoprazole (protonix) 40 mg   Take  30-60 min before first meal of the day and Pepcid (famotidine)  20 mg after supper until return to office -   Plan B = Backup (to supplement plan A, not to replace it) Only use your albuterol inhaler as a rescue medication Plan C = Crisis (instead of Plan B but only if Plan B stops working) - only use your albuterol nebulizer if you first try Plan B and it fails to help > ok to use the nebulizer up to every 4 hours but if start needing it regularly call for immediate appointment GERD diet reviewed, bed blocks rec  Please schedule a follow up office visit in 4 weeks, sooner if needed - bring your inhalers and solutions     03/31/2021  f/u ov/Robert Adams re: GOLD 3 copd/ throat clearing   maint on symb 80/ gerd rx and no better   Chief Complaint  Patient presents with   Follow-up    Breathing is unchanged since the last visit. He has cough with clear  sputum past 2-3 wks. He rarely uses his albuterol inhaler or neb.   Dyspnea:  mb to house uphill 200 ft and has to stop  Cough: throat clearing no better on hard candy  Sleeping: electric slt elevation does fine s resp cc  SABA use: rarely / does not really help but does not prechallenge  02: none  Covid status:   vax x none,  covid 2 y prior to ov - probably the alpha  Rec Please remember to go to the lab department   for your tests - we will call you with the results when they are available. Ok to stop reflux medication  Ok to try albuterol 15 min before an activity (on alternating days)  that you know would usually make you short of breath   Make sure you check your oxygen saturation at your highest level of activity to be sure it stays over 90%  Please schedule a follow up office visit in 6 weeks with PFTs > not done    03/06/2022  ACUTE ov/Robert Adams re: GOLD 3    maint on trelegy  and rarely saba Chief Complaint  Patient  presents with   Follow-up    He was at urgent care 2 times this weekend and though he had a flare of diverticulitis. He is on prednisone, doxycyline,   Dyspnea:  worse since 9/28 ST/ severe cough all clear phlegm> seen 29th and 30thsevere cough > rx promethazine no better Cough: rattling but no production  Sleeping: bothered by cough  SABA use: not much 02: none  Covid status:   never Rec Take mucinex dm 1200 mg  every 12 hours and supplement if needed with  tramadol 50 mg up to 1- 2 every 4 hours to suppress the urge to cough. Swallowing water and/or using ice chips/non mint and menthol containing candies (such as lifesavers or sugarless jolly ranchers) are also effective.  You should rest your voice and avoid activities that you know make you cough. Stop trelegy until cough is gone and use the nebulizer up to every 4 hours as needed  Try prilosec otc '20mg'$   Take 30-60 min before first meal of the day and Pepcid ac (famotidine) 20 mg one @  bedtime until cough is completely gone for at least a week without the need for cough suppression GERD diet reviewed, bed blocks rec  Please schedule a follow up office visit in 6 weeks, call sooner if needed with pfts     /06/27/2022  f/u ov/Robert Adams re: copd    maint on trelegy  200 Chief Complaint  Patient presents with   Follow-up    Review PFT today.  Needs refill for Trelegy.  This helps sx. Doing well.  Dis have episode of A-Fib, now on Eloquis  Dyspnea: MMRC1 = can walk nl pace, flat grade, can't hurry or go uphills or steps s sob   Cough:  hoarseness / some throat clearing but no significant production  Sleeping: well p clonazepam  SABA use: rarely  02: none  Covid status:   never shots/ one infection Lung cancer screening :  [facility]    No obvious day to day or daytime variability or assoc excess/ purulent sputum or mucus plugs or hemoptysis or cp or chest tightness, subjective wheeze or overt sinus or hb symptoms.   Sleeping  without  nocturnal  or early am exacerbation  of respiratory  c/o's or need for noct saba. Also denies any obvious fluctuation of symptoms  with weather or environmental changes or other aggravating or alleviating factors except as outlined above   No unusual exposure hx or h/o childhood pna/ asthma or knowledge of premature birth.  Current Allergies, Complete Past Medical History, Past Surgical History, Family History, and Social History were reviewed in [employer] record.  ROS  The following are not active complaints unless bolded Hoarseness, sore throat, dysphagia, dental problems, itching, sneezing,  nasal congestion or discharge of excess mucus or purulent secretions, ear ache,   fever, chills, sweats, unintended wt loss or wt gain, classically pleuritic or exertional cp,  orthopnea pnd or arm/hand swelling  or leg swelling, presyncope, palpitations, abdominal pain, anorexia, nausea, vomiting, diarrhea  or change in bowel habits or change in bladder habits, change in stools or change in urine, dysuria, hematuria,  rash, arthralgias, visual complaints, headache, numbness, weakness/neuropathy both feet or ataxia or problems with walking or coordination,  change in mood or  memory.        Current Meds  Medication Sig   albuterol (PROVENTIL HFA;VENTOLIN HFA) 108 (90 Base) MCG/ACT inhaler Inhale 2 puffs into the lungs every 6 (six) hours as needed for wheezing or shortness of breath.    augmented betamethasone dipropionate (DIPROLENE-AF) 0.05 % cream APPLY SMALL AMOUNT TO AFFECTED AREA TWICE A DAY   clonazePAM (KLONOPIN) 1 MG tablet Take 1 mg by mouth at bedtime.   cyanocobalamin 2000 MCG tablet Take 2,500 mcg by mouth daily. 2,559mg   finasteride (PROSCAR) 5 MG tablet Take 5 mg by mouth every evening.   Fluticasone-Umeclidin-Vilant (TRELEGY ELLIPTA) 200-62.5-25 MCG/ACT AEPB Inhale 1 puff by mouth into the lungs daily   Hyoscyamine Sulfate SL (LEVSIN/SL) 0.125 MG SUBL Take one tablet  every 6-8 hours as needed   ipratropium-albuterol (DUONEB) 0.5-2.5 (3) MG/3ML SOLN Take 3 mLs by nebulization every 6 (six) hours as needed.   Multiple Vitamins-Minerals (PRESERVISION AREDS 2 PO) Take 1 capsule by mouth daily.   pantoprazole (PROTONIX) 40 MG tablet Take 1 tablet (40 mg total) by mouth daily. Take 30-60 min before first meal of the day   Tamsulosin HCl (FLOMAX) 0.4 MG CAPS Take 0.4 mg by mouth at bedtime.   Vitamin D, Ergocalciferol, (DRISDOL) 1.25 MG (50000 UNIT) CAPS capsule Take 50,000 Units by mouth once a week.             Objective:   Physical Exam    Wts  06/27/2022        168  03/06/2022        170 03/31/2021     166  03/06/2021       164   12/27/16 167 lb (75.8 kg)  10/22/16 165 lb (74.8 kg)  06/24/16 160 lb (72.6 kg)    Vital signs reviewed  06/27/2022  - Note at rest 02 sats  95% on RA   General appearance:    amb slt hoarse wm nad   HEENT : Oropharynx  slt   Nasal turbinates nl    NECK :  without  apparent JVD/ palpable Nodes/TM    LUNGS: no acc muscle use,  Mild barrel  contour chest wall with bilateral  Distant bs s audible wheeze and  without cough on insp or exp maneuvers  and mild  Hyperresonant  to  percussion bilaterally     CV:  RRR  no s3 or murmur or increase in P2, and no edema   ABD:  soft and nontender with pos end  insp Hoover's  in  the supine position.  No bruits or organomegaly appreciated   MS:  Nl gait/ ext warm without deformities Or obvious joint restrictions  calf tenderness, cyanosis or clubbing     SKIN: warm and dry without lesions    NEURO:  alert, approp, nl sensorium with  no motor or cerebellar deficits apparent.            Assessment:

## 2022-08-15 ENCOUNTER — Emergency Department (HOSPITAL_COMMUNITY): Payer: No Typology Code available for payment source

## 2022-08-15 ENCOUNTER — Other Ambulatory Visit: Payer: Self-pay

## 2022-08-15 ENCOUNTER — Encounter (HOSPITAL_COMMUNITY): Payer: Self-pay

## 2022-08-15 ENCOUNTER — Emergency Department (HOSPITAL_COMMUNITY)
Admission: EM | Admit: 2022-08-15 | Discharge: 2022-08-15 | Disposition: A | Discharge status: Home / Self Care | Payer: No Typology Code available for payment source | Attending: Emergency Medicine | Admitting: Emergency Medicine

## 2022-08-15 DIAGNOSIS — R42 Dizziness and giddiness: Secondary | ICD-10-CM | POA: Diagnosis not present

## 2022-08-15 DIAGNOSIS — R1030 Lower abdominal pain, unspecified: Secondary | ICD-10-CM | POA: Diagnosis not present

## 2022-08-15 DIAGNOSIS — F411 Generalized anxiety disorder: Secondary | ICD-10-CM | POA: Insufficient documentation

## 2022-08-15 DIAGNOSIS — Z79899 Other long term (current) drug therapy: Secondary | ICD-10-CM | POA: Insufficient documentation

## 2022-08-15 DIAGNOSIS — I10 Essential (primary) hypertension: Secondary | ICD-10-CM | POA: Diagnosis not present

## 2022-08-15 DIAGNOSIS — J449 Chronic obstructive pulmonary disease, unspecified: Secondary | ICD-10-CM | POA: Insufficient documentation

## 2022-08-15 DIAGNOSIS — I4891 Unspecified atrial fibrillation: Secondary | ICD-10-CM | POA: Insufficient documentation

## 2022-08-15 LAB — CBC WITH DIFFERENTIAL/PLATELET
Abs Immature Granulocytes: 0.07 10*3/uL (ref 0.00–0.07)
Basophils Absolute: 0 10*3/uL (ref 0.0–0.1)
Basophils Relative: 0 %
Eosinophils Absolute: 0.1 10*3/uL (ref 0.0–0.5)
Eosinophils Relative: 1 %
HCT: 43.3 % (ref 39.0–52.0)
Hemoglobin: 15.1 g/dL (ref 13.0–17.0)
Immature Granulocytes: 1 %
Lymphocytes Relative: 7 %
Lymphs Abs: 0.9 10*3/uL (ref 0.7–4.0)
MCH: 30.7 pg (ref 26.0–34.0)
MCHC: 34.9 g/dL (ref 30.0–36.0)
MCV: 88 fL (ref 80.0–100.0)
Monocytes Absolute: 0.7 10*3/uL (ref 0.1–1.0)
Monocytes Relative: 6 %
Neutro Abs: 11.2 10*3/uL — ABNORMAL HIGH (ref 1.7–7.7)
Neutrophils Relative %: 85 %
Platelets: 366 10*3/uL (ref 150–400)
RBC: 4.92 MIL/uL (ref 4.22–5.81)
RDW: 13.6 % (ref 11.5–15.5)
WBC: 13 10*3/uL — ABNORMAL HIGH (ref 4.0–10.5)
nRBC: 0 % (ref 0.0–0.2)

## 2022-08-15 LAB — COMPREHENSIVE METABOLIC PANEL
ALT: 20 U/L (ref 0–44)
AST: 24 U/L (ref 15–41)
Albumin: 3.5 g/dL (ref 3.5–5.0)
Alkaline Phosphatase: 66 U/L (ref 38–126)
Anion gap: 8 (ref 5–15)
BUN: 11 mg/dL (ref 8–23)
CO2: 24 mmol/L (ref 22–32)
Calcium: 9.3 mg/dL (ref 8.9–10.3)
Chloride: 108 mmol/L (ref 98–111)
Creatinine, Ser: 0.94 mg/dL (ref 0.61–1.24)
GFR, Estimated: >60 mL/min (ref >60)
Glucose, Bld: 104 mg/dL — ABNORMAL HIGH (ref 70–99)
Potassium: 4.8 mmol/L (ref 3.5–5.1)
Sodium: 140 mmol/L (ref 135–145)
Total Bilirubin: 0.7 mg/dL (ref 0.3–1.2)
Total Protein: 5.9 g/dL — ABNORMAL LOW (ref 6.5–8.1)

## 2022-08-15 LAB — URINALYSIS, ROUTINE W REFLEX MICROSCOPIC
Bilirubin Urine: NEGATIVE
Glucose, UA: NEGATIVE mg/dL
Hgb urine dipstick: NEGATIVE
Ketones, ur: NEGATIVE mg/dL
Leukocytes,Ua: NEGATIVE
Nitrite: NEGATIVE
Protein, ur: NEGATIVE mg/dL
Specific Gravity, Urine: 1.042 — ABNORMAL HIGH (ref 1.005–1.030)
pH: 6 (ref 5.0–8.0)

## 2022-08-15 LAB — CBG MONITORING, ED: Glucose-Capillary: 96 mg/dL (ref 70–99)

## 2022-08-15 LAB — LIPASE, BLOOD: Lipase: 28 U/L (ref 11–51)

## 2022-08-15 LAB — TROPONIN I (HIGH SENSITIVITY)
Troponin I (High Sensitivity): 4 ng/L (ref <18)
Troponin I (High Sensitivity): 4 ng/L (ref <18)

## 2022-08-15 MED ORDER — LACTATED RINGERS IV BOLUS
1000.0000 mL | Freq: Once | INTRAVENOUS | Status: AC
  Administered 2022-08-15: 1000 mL via INTRAVENOUS

## 2022-08-15 MED ORDER — IOHEXOL 350 MG/ML SOLN
75.0000 mL | Freq: Once | INTRAVENOUS | Status: AC | PRN
  Administered 2022-08-15: 75 mL via INTRAVENOUS

## 2022-08-15 NOTE — ED Notes (Signed)
Orthostatic vitals completed.  Pt tolerated activity well. Pt denies feeling any symptoms during position changes.  Pt had 20 pt systolic drop going from sitting to standing.   EDP notified of these results.

## 2022-08-15 NOTE — ED Provider Notes (Signed)
[facility] Provider Note   CSN: [id number] Arrival date & time: 08/15/22  [id number]     History  Chief Complaint  Patient presents with   Near Syncope    From home via ems for c/o sudden onset of feeling light headedness, weakness, diaphoresis, nausea. Recently hospitalized at [facility] for abnormal findings on Holter monitor.     Robert Adams is a 77 y.o. male with COPD, HTN, Afib/flutter, GERD, diarrhea, eczema, and GAD who presents with weakness.  Patient p/w one day of lightheadedness a/w nausea and generalized weakness after he got up this morning. Also was diaphoretic at that time. Does endorse generalized abdominal pain of the same time frame without any nausea/vomiting. Denies CP, SOB, cough, melena/hematochezia, urinary symptoms, diarrhea/constipation, lower extremity edema, LOC, asymmetric weakness/numbness/tingling.   HPI     Home Medications Prior to Admission medications   Medication Sig Start Date End Date Taking? Authorizing Provider  albuterol (PROVENTIL HFA;VENTOLIN HFA) 108 (90 Base) MCG/ACT inhaler Inhale 2 puffs into the lungs every 6 (six) hours as needed for wheezing or shortness of breath.     [provider]  augmented betamethasone dipropionate (DIPROLENE-AF) 0.05 % cream APPLY SMALL AMOUNT TO AFFECTED AREA TWICE A DAY 08/15/20   [provider]  clonazePAM (KLONOPIN) 1 MG tablet Take 1 mg by mouth at bedtime. 05/09/16   [provider]  cyanocobalamin 2000 MCG tablet Take 2,500 mcg by mouth daily. 2,596mcg    [provider]  finasteride (PROSCAR) 5 MG tablet Take 5 mg by mouth every evening.    [provider]  Fluticasone-Umeclidin-Vilant [facility] ELLIPTA) 100-62.5-25 MCG/ACT AEPB One click each am [id number]   Tanda Rockers, MD  Fluticasone-Umeclidin-Vilant (TRELEGY ELLIPTA) 100-62.5-25 MCG/ACT AEPB Inhale 1 puff into the lungs in the morning. 06/27/22   Tanda Rockers, MD   Fluticasone-Umeclidin-Vilant (TRELEGY ELLIPTA) 200-62.5-25 MCG/ACT AEPB Inhale 1 puff by mouth into the lungs daily 02/06/22     Hyoscyamine Sulfate SL (LEVSIN/SL) 0.125 MG SUBL Take one tablet every 6-8 hours as needed 04/19/22   Zehr, [name] D, PA-C  ipratropium-albuterol (DUONEB) 0.5-2.5 (3) MG/3ML SOLN Take 3 mLs by nebulization every 6 (six) hours as needed. 02/17/21   [provider]  Multiple Vitamins-Minerals (PRESERVISION AREDS 2 PO) Take 1 capsule by mouth daily.    [provider]  pantoprazole (PROTONIX) 40 MG tablet Take 1 tablet (40 mg total) by mouth daily. Take 30-60 min before first meal of the day 03/06/21   Tanda Rockers, MD  Tamsulosin HCl (FLOMAX) 0.4 MG CAPS Take 0.4 mg by mouth at bedtime.    [provider]  Vitamin D, Ergocalciferol, (DRISDOL) 1.25 MG (50000 UNIT) CAPS capsule Take 50,000 Units by mouth once a week. 11/15/20   [provider]      Allergies    Grass extracts [gramineae pollens] and Penicillins    Review of Systems   Review of Systems Review of systems Negative for f/c.  A 10 point review of systems was performed and is negative unless otherwise reported in HPI.  Physical Exam Updated Vital Signs BP (!) 143/76   Pulse 60   Temp 97.9 F (36.6 C) (Oral)   Resp 19   Ht 5\' 10"  (1.778 m)   Wt 74.8 kg   SpO2 98%   BMI 23.68 kg/m  Physical Exam General: Normal appearing male, lying in bed.  HEENT: PERRLA, EOMI, no nystagmus, Sclera anicteric, dry mucous membranes, trachea  midline.  Cardiology: RRR, no murmurs/rubs/gallops. BL radial and DP pulses equal bilaterally.  Resp: Normal respiratory rate and effort. CTAB, no wheezes, rhonchi, crackles.  Abd: Generalized abdominal tenderness to palpation. Negative murphy's sign. Soft, non-distended. No rebound tenderness or guarding.  GU: Deferred. MSK: No peripheral edema or signs of trauma. Extremities without deformity or TTP. No cyanosis or clubbing. Skin: warm, dry.   Neuro: A&Ox4, CNs II-XII grossly intact. MAEs. Sensation grossly intact.  Psych: Normal mood and affect.   ED Results / Procedures / Treatments   Labs (all labs ordered are listed, but only abnormal results are displayed) Labs Reviewed  URINALYSIS, ROUTINE W REFLEX MICROSCOPIC - Abnormal; Notable for the following components:      Result Value   Specific Gravity, Urine 1.042 (*)    All other components within normal limits  COMPREHENSIVE METABOLIC PANEL - Abnormal; Notable for the following components:   Glucose, Bld 104 (*)    Total Protein 5.9 (*)    All other components within normal limits  CBC WITH DIFFERENTIAL/PLATELET - Abnormal; Notable for the following components:   WBC 13.0 (*)    Neutro Abs 11.2 (*)    All other components within normal limits  LIPASE, BLOOD  CBG MONITORING, ED  TROPONIN I (HIGH SENSITIVITY)  TROPONIN I (HIGH SENSITIVITY)    EKG EKG Interpretation  Date/Time:  Wednesday August 15 2022 09:42:33 EDT Ventricular Rate:  69 PR Interval:  170 QRS Duration: 122 QT Interval:  411 QTC Calculation: 441 R Axis:   -50 Text Interpretation: Sinus rhythm Nonspecific IVCD with LAD LVH with secondary repolarization abnormality ST elevations in anterior leads and depressions in inferior leads similar to prior EKGs Confirmed by Cindee Lame [phone]) on 08/15/2022 9:45:23 AM  Radiology CT abd pelvis w/ contrast: IMPRESSION: 1. No acute abnormality in the abdomen or pelvis. 2. Descending and sigmoid diverticulosis without evidence of acute diverticulitis. 3. Bilateral nonobstructing renal calculi. 4. Coronary atherosclerosis. 5.  Aortic Atherosclerosis (ICD10-I70.0).  Procedures Procedures    Medications Ordered in ED Medications  iohexol (OMNIPAQUE) 350 MG/ML injection 75 mL (75 mLs Intravenous Contrast Given 08/15/22 1141)  lactated ringers bolus 1,000 mL (0 mLs Intravenous Stopped 08/15/22 1411)    ED Course/ Medical Decision Making/ A&P                           Medical Decision Making Amount and/or Complexity of Data Reviewed Labs: ordered. Decision-making details documented in ED Course. Radiology: ordered. Decision-making details documented in ED Course.  Risk Prescription drug management.    This patient presents to the ED for concern of syncope, this involves an extensive number of treatment options, and is a complaint that carries with it a high risk of complications and morbidity.  I considered the following differential and admission for this acute, potentially life threatening condition.   MDM:    DDX for syncope includes but is not limited to:  With patient's dizziness and abdominal pain, etiology such as intra-abdominal infection such as diverticulitis or appendicitis, cystitis. Pancreatitis. No CVA tenderness or back pain pyelonephritis.  No nausea vomiting diarrhea to suggest gastroenteritis or colitis.  Consider orthostasis, anemia and electrolyte abnormalities as possible etiologies. Consider arrhythmias and ACS, although without associated symptoms, less likely. Consider hemorrhage vs CVA, although neuro intact now for 24 hours and no associated other symptoms. Given history, exam and workup, low suspicion for HF, ICH (no trauma, headache), stroke (no focal neuro deficits), ACS (neg  troponin, no anginal pain), aortic dissection (no chest pain), or GI bleed (stable hgb). Low suspicion for PE given normal vital signs, absence of chest pain or dyspnea, no evidence of DVT, no recent surgery/immobilization.  Clinical Course as of 09/01/22 1042  Wed Aug 15, 2022  1054 Glucose-Capillary: 96 [HN]  1113 Troponin I (High Sensitivity): 4 [HN]  1113 WBC(!): 13.0 +leukocytosis w/ left shift [HN]  1113 Comprehensive metabolic panel(!) unremarkable [HN]  1207 CT ABDOMEN PELVIS W CONTRAST 1. No acute abnormality in the abdomen or pelvis. 2. Descending and sigmoid diverticulosis without evidence of acute diverticulitis. 3.  Bilateral nonobstructing renal calculi. 4. Coronary atherosclerosis. 5.  Aortic Atherosclerosis (ICD10-I70.0).   [HN]  1240 Orthostatics positive. Will give 1 L IVF and reevaluate. [HN]  1336 Urinalysis, Routine w reflex microscopic -Urine, Clean Catch(!) Neg for infection. Spec grav mildly elevated. [HN]  1336 Creatinine: 0.94 At baseline [HN]  1345 Troponin I (High Sensitivity): 4 Flat [HN]    Clinical Course User Index [HN] Audley Hose, MD    Labs: I Ordered, and personally interpreted labs.  The pertinent results include:  those listed above  Imaging Studies ordered: I ordered imaging studies including CT abd pelvis w contrast I independently visualized and interpreted imaging. I agree with the radiologist interpretation  Additional history obtained from chart review.    Cardiac Monitoring: The patient was maintained on a cardiac monitor.  I personally viewed and interpreted the cardiac monitored which showed an underlying rhythm of: NSR  Reevaluation: After the interventions noted above, I reevaluated the patient and found that they have :improved  Social Determinants of Health: Patient lives independently   Disposition:  Pt with orthostatic vitals/symptoms, improved after fluid bolus. Likely the cause of his symptoms. Unclear cause of abdominal pain or leukocytosis, though CT reassuring and urine negative for infection. Instructed to stay well hydrated at home. DC w/ discharge instructions/return precautions, instructed to f/u with PCP in 1-2 weeks. All questions answered to patient's satisfaction.    Co morbidities that complicate the patient evaluation  Past Medical History:  Diagnosis Date   Allergy    seasonal   Anxiety    Arthritis    BPH (benign prostatic hyperplasia)    COPD (chronic obstructive pulmonary disease) (HCC)    Depression    GERD (gastroesophageal reflux disease)    Glaucoma    Hypertension    Sleep apnea      Medicines Meds  ordered this encounter  Medications   iohexol (OMNIPAQUE) 350 MG/ML injection 75 mL   lactated ringers bolus 1,000 mL    I have reviewed the patients home medicines and have made adjustments as needed  Problem List / ED Course: Problem List Items Addressed This Visit       Other   Lower abdominal pain   Other Visit Diagnoses     Orthostatic dizziness    -  Primary                   This note was created using dictation software, which may contain spelling or grammatical errors.    Audley Hose, MD 09/01/22 [phone]

## 2022-08-15 NOTE — Discharge Instructions (Addendum)
Thank you for coming to [facility] Emergency Department. You were seen for dizziness, abdominal pain. We did an exam, labs, and imaging, and these showed orthostatic dizziness and likely dehydration. Please stay well hydrated at home. The CT of your abdomen did not show any acute findings.  Please follow up with your primary care provider within 1 week.   Do not hesitate to return to the ED or call 911 if you experience: -Worsening symptoms -Chest pain, shortness of breath -Lightheadedness, passing out -Fevers/chills -Anything else that concerns you

## 2022-08-15 NOTE — ED Notes (Signed)
Patient transported to CT 

## 2022-09-12 ENCOUNTER — Other Ambulatory Visit: Payer: Self-pay

## 2022-09-12 ENCOUNTER — Emergency Department (HOSPITAL_COMMUNITY): Payer: No Typology Code available for payment source

## 2022-09-12 ENCOUNTER — Emergency Department (HOSPITAL_COMMUNITY)
Admission: EM | Admit: 2022-09-12 | Discharge: 2022-09-12 | Disposition: A | Discharge status: Home / Self Care | Payer: No Typology Code available for payment source | Attending: Emergency Medicine | Admitting: Emergency Medicine

## 2022-09-12 DIAGNOSIS — R0789 Other chest pain: Secondary | ICD-10-CM | POA: Diagnosis present

## 2022-09-12 DIAGNOSIS — Z7901 Long term (current) use of anticoagulants: Secondary | ICD-10-CM | POA: Diagnosis not present

## 2022-09-12 DIAGNOSIS — I4891 Unspecified atrial fibrillation: Secondary | ICD-10-CM | POA: Insufficient documentation

## 2022-09-12 LAB — BASIC METABOLIC PANEL
Anion gap: 10 (ref 5–15)
BUN: 13 mg/dL (ref 8–23)
CO2: 20 mmol/L — ABNORMAL LOW (ref 22–32)
Calcium: 9.2 mg/dL (ref 8.9–10.3)
Chloride: 106 mmol/L (ref 98–111)
Creatinine, Ser: 0.84 mg/dL (ref 0.61–1.24)
GFR, Estimated: >60 mL/min (ref >60)
Glucose, Bld: 98 mg/dL (ref 70–99)
Potassium: 4.6 mmol/L (ref 3.5–5.1)
Sodium: 136 mmol/L (ref 135–145)

## 2022-09-12 LAB — CBC WITH DIFFERENTIAL/PLATELET
Abs Immature Granulocytes: 0.03 10*3/uL (ref 0.00–0.07)
Basophils Absolute: 0 10*3/uL (ref 0.0–0.1)
Basophils Relative: 1 %
Eosinophils Absolute: 0.2 10*3/uL (ref 0.0–0.5)
Eosinophils Relative: 3 %
HCT: 43.5 % (ref 39.0–52.0)
Hemoglobin: 14.9 g/dL (ref 13.0–17.0)
Immature Granulocytes: 0 %
Lymphocytes Relative: 15 %
Lymphs Abs: 1.1 10*3/uL (ref 0.7–4.0)
MCH: 29.9 pg (ref 26.0–34.0)
MCHC: 34.3 g/dL (ref 30.0–36.0)
MCV: 87.2 fL (ref 80.0–100.0)
Monocytes Absolute: 0.9 10*3/uL (ref 0.1–1.0)
Monocytes Relative: 12 %
Neutro Abs: 5 10*3/uL (ref 1.7–7.7)
Neutrophils Relative %: 69 %
Platelets: 357 10*3/uL (ref 150–400)
RBC: 4.99 MIL/uL (ref 4.22–5.81)
RDW: 13.6 % (ref 11.5–15.5)
WBC: 7.2 10*3/uL (ref 4.0–10.5)
nRBC: 0 % (ref 0.0–0.2)

## 2022-09-12 LAB — MAGNESIUM: Magnesium: 1.8 mg/dL (ref 1.7–2.4)

## 2022-09-12 MED ORDER — SODIUM CHLORIDE 0.9 % IV BOLUS
1000.0000 mL | Freq: Once | INTRAVENOUS | Status: AC
  Administered 2022-09-12: 1000 mL via INTRAVENOUS

## 2022-09-12 MED ORDER — METOPROLOL TARTRATE 5 MG/5ML IV SOLN
5.0000 mg | Freq: Once | INTRAVENOUS | Status: AC
  Administered 2022-09-12: 5 mg via INTRAVENOUS
  Filled 2022-09-12: qty 5

## 2022-09-12 MED ORDER — MAGNESIUM SULFATE 2 GM/50ML IV SOLN
2.0000 g | Freq: Once | INTRAVENOUS | Status: AC
  Administered 2022-09-12: 2 g via INTRAVENOUS
  Filled 2022-09-12: qty 50

## 2022-09-12 MED ORDER — PROPOFOL 10 MG/ML IV BOLUS
0.5000 mg/kg | Freq: Once | INTRAVENOUS | Status: AC
  Administered 2022-09-12: 37 mg via INTRAVENOUS
  Filled 2022-09-12: qty 20

## 2022-09-12 NOTE — ED Provider Notes (Signed)
[facility] Provider Note   CSN: [id number] Arrival date & time: 09/12/22  1005     History  Chief Complaint  Patient presents with   Chest Pain    Robert Adams is a 77 y.o. male.  77 yo M with a chief complaints of chest discomfort.  He went to the [place] today and was told that he was in atrial fibrillation with RVR and was sent here for evaluation.  He has a history of the same.  Not typically in A-fib per him.  He takes Eliquis and denies any missed doses.  He denies being sick denies cough congestion or fever denies abdominal pain nausea vomiting or diarrhea.  Denies any recent medication changes.  He thinks he felt like this at his prior ED visit and was told that he was may be a bit dehydrated.  He denies any decreased oral intake.   Chest Pain      Home Medications Prior to Admission medications   Medication Sig Start Date End Date Taking? Authorizing Provider  albuterol (PROVENTIL HFA;VENTOLIN HFA) 108 (90 Base) MCG/ACT inhaler Inhale 2 puffs into the lungs every 6 (six) hours as needed for wheezing or shortness of breath.     [provider]  augmented betamethasone dipropionate (DIPROLENE-AF) 0.05 % cream APPLY SMALL AMOUNT TO AFFECTED AREA TWICE A DAY 08/15/20   [provider]  clonazePAM (KLONOPIN) 1 MG tablet Take 1 mg by mouth at bedtime. 05/09/16   [provider]  cyanocobalamin 2000 MCG tablet Take 2,500 mcg by mouth daily. 2,572mcg    [provider]  finasteride (PROSCAR) 5 MG tablet Take 5 mg by mouth every evening.    [provider]  Fluticasone-Umeclidin-Vilant [facility] ELLIPTA) 100-62.5-25 MCG/ACT AEPB One click each am 06/27/22   Robert Cowden, MD  Fluticasone-Umeclidin-Vilant (TRELEGY ELLIPTA) 100-62.5-25 MCG/ACT AEPB Inhale 1 puff into the lungs in the morning. 06/27/22   Robert Cowden, MD  Fluticasone-Umeclidin-Vilant (TRELEGY ELLIPTA) 200-62.5-25 MCG/ACT AEPB  Inhale 1 puff by mouth into the lungs daily 02/06/22     Hyoscyamine Sulfate SL (LEVSIN/SL) 0.125 MG SUBL Take one tablet every 6-8 hours as needed 04/19/22   Zehr, Shanda Bumps D, PA-C  ipratropium-albuterol (DUONEB) 0.5-2.5 (3) MG/3ML SOLN Take 3 mLs by nebulization every 6 (six) hours as needed. 02/17/21   [provider]  Multiple Vitamins-Minerals (PRESERVISION AREDS 2 PO) Take 1 capsule by mouth daily.    [provider]  pantoprazole (PROTONIX) 40 MG tablet Take 1 tablet (40 mg total) by mouth daily. Take 30-60 min before first meal of the day 03/06/21   Robert Cowden, MD  Tamsulosin HCl (FLOMAX) 0.4 MG CAPS Take 0.4 mg by mouth at bedtime.    [provider]  Vitamin D, Ergocalciferol, (DRISDOL) 1.25 MG (50000 UNIT) CAPS capsule Take 50,000 Units by mouth once a week. 11/15/20   [provider]      Allergies    Grass extracts [gramineae pollens] and Penicillins    Review of Systems   Review of Systems  Cardiovascular:  Positive for chest pain.    Physical Exam Updated Vital Signs BP 131/76   Pulse 63   Temp (!) 97.2 F (36.2 C)   Resp 20   Ht 5\' 10"  (1.778 m)   Wt 73.9 kg   SpO2 100%   BMI 23.39 kg/m  Physical Exam Vitals and nursing note reviewed.  Constitutional:      Appearance:  He is well-developed.  HENT:     Head: Normocephalic and atraumatic.  Eyes:     Pupils: Pupils are equal, round, and reactive to light.  Neck:     Vascular: No JVD.  Cardiovascular:     Rate and Rhythm: Tachycardia present. Rhythm irregular.     Heart sounds: No murmur heard.    No friction rub. No gallop.  Pulmonary:     Effort: No respiratory distress.     Breath sounds: No wheezing.  Abdominal:     General: There is no distension.     Tenderness: There is no abdominal tenderness. There is no guarding or rebound.  Musculoskeletal:        General: Normal range of motion.     Cervical back: Normal range of motion and neck supple.  Skin:     Coloration: Skin is not pale.     Findings: No rash.  Neurological:     Mental Status: He is alert and oriented to person, place, and time.  Psychiatric:        Behavior: Behavior normal.     ED Results / Procedures / Treatments   Labs (all labs ordered are listed, but only abnormal results are displayed) Labs Reviewed  BASIC METABOLIC PANEL - Abnormal; Notable for the following components:      Result Value   CO2 20 (*)    All other components within normal limits  CBC WITH DIFFERENTIAL/PLATELET  MAGNESIUM    EKG EKG Interpretation  Date/Time:  Wednesday September 12 2022 10:12:56 EDT Ventricular Rate:  105 PR Interval:    QRS Duration: 117 QT Interval:  298 QTC Calculation: 394 R Axis:   -51 Text Interpretation: Atrial fibrillation LAD, consider left anterior fascicular block LVH with secondary repolarization abnormality Otherwise no significant change Confirmed by Melene PlanFloyd, Imanni Burdine [phone] on 09/12/2022 10:21:05 AM  Radiology DG Chest Port 1 View  Result Date: 09/12/2022 CLINICAL DATA:  Afib EXAM: PORTABLE CHEST - 1 VIEW COMPARISON:  03/06/2021. FINDINGS: The heart size and mediastinal contours are within normal limits. Both lungs are clear. No pneumothorax or pleural effusion. Aorta is calcified. There are thoracic degenerative changes. IMPRESSION: No active cardiopulmonary disease. Electronically Signed   By: Layla MawJoshua  Pleasure M.D.   On: 09/12/2022 10:30    Procedures .Critical Care  Performed by: Melene PlanFloyd, Jalayna Josten, DO Authorized by: Melene PlanFloyd, Pati Thinnes, DO   Critical care provider statement:    Critical care time (minutes):  35   Critical care time was exclusive of:  Separately billable procedures and treating other patients   Critical care was time spent personally by me on the following activities:  Development of treatment plan with patient or surrogate, discussions with consultants, evaluation of patient's response to treatment, examination of patient, ordering and review of laboratory  studies, ordering and review of radiographic studies, ordering and performing treatments and interventions, pulse oximetry, re-evaluation of patient's condition and review of old charts   Care discussed with: admitting provider   .Cardioversion  Date/Time: 09/12/2022 2:15 PM  Performed by: Melene PlanFloyd, Ambermarie Honeyman, DO Authorized by: Melene PlanFloyd, Pranshu Lyster, DO   Consent:    Consent obtained:  Written   Consent given by:  Patient   Risks discussed:  Death, induced arrhythmia, cutaneous burn and pain   Alternatives discussed:  No treatment, rate-control medication, alternative treatment, observation and referral Pre-procedure details:    Cardioversion basis:  Emergent   Rhythm:  Atrial fibrillation   Electrode placement:  Anterior-posterior Patient sedated: Yes. Refer to sedation procedure documentation  for details of sedation.  Attempt one:    Cardioversion mode:  Synchronous   Waveform:  Biphasic   Shock (Joules):  200   Shock outcome:  Conversion to normal sinus rhythm Post-procedure details:    Patient status:  Alert   Patient tolerance of procedure:  Tolerated well, no immediate complications .Sedation  Date/Time: 09/12/2022 2:15 PM  Performed by: Melene Plan, DO Authorized by: Melene Plan, DO   Consent:    Consent obtained:  Written   Consent given by:  Patient   Risks discussed:  Allergic reaction, dysrhythmia, inadequate sedation, nausea, respiratory compromise necessitating ventilatory assistance and intubation, prolonged sedation necessitating reversal and prolonged hypoxia resulting in organ damage   Alternatives discussed:  Analgesia without sedation Universal protocol:    Immediately prior to procedure, a time out was called: yes     Patient identity confirmed:  Verbally with patient Indications:    Procedure performed:  Cardioversion   Procedure necessitating sedation performed by:  Physician performing sedation Pre-sedation assessment:    Time since last food or drink:  6   ASA  classification: class 2 - patient with mild systemic disease     Mouth opening:  2 finger widths   Thyromental distance:  2 finger widths   Mallampati score:  II - soft palate, uvula, fauces visible   Neck mobility: normal     Pre-sedation assessments completed and reviewed: airway patency, cardiovascular function, hydration status, mental status, nausea/vomiting, pain level, respiratory function and temperature   Immediate pre-procedure details:    Reviewed: vital signs     Verified: bag valve mask available, emergency equipment available, intubation equipment available, IV patency confirmed, oxygen available and suction available   Procedure details (see MAR for exact dosages):    Preoxygenation:  Nasal cannula   Sedation:  Propofol   Intended level of sedation: moderate (conscious sedation)   Analgesia:  None   Intra-procedure monitoring:  Blood pressure monitoring, cardiac monitor, continuous capnometry, continuous pulse oximetry, frequent LOC assessments and frequent vital sign checks   Intra-procedure events: none     Total Provider sedation time (minutes):  35 Post-procedure details:    Post-sedation assessments completed and reviewed: airway patency, cardiovascular function, hydration status, mental status, nausea/vomiting, pain level, respiratory function and temperature     Patient is stable for discharge or admission: yes     Procedure completion:  Tolerated well, no immediate complications     Medications Ordered in ED Medications  sodium chloride 0.9 % bolus 1,000 mL (0 mLs Intravenous Stopped 09/12/22 1350)  metoprolol tartrate (LOPRESSOR) injection 5 mg (5 mg Intravenous Given 09/12/22 1039)  propofol (DIPRIVAN) 10 mg/mL bolus/IV push 37 mg (37 mg Intravenous Given 09/12/22 1237)  magnesium sulfate IVPB 2 g 50 mL (0 g Intravenous Stopped 09/12/22 1350)    ED Course/ Medical Decision Making/ A&P                             Medical Decision Making Amount and/or  Complexity of Data Reviewed Labs: ordered. Radiology: ordered.  Risk Prescription drug management.   77 yo M with a chief complaints of chest discomfort.  Patient is in A-fib with rates going into the 140s.  I discussed risk benefits of cardioversion with him and he is currently electing for the same.  Will give a bolus of IV fluids bolus dose of metoprolol blood work.  Reassess.  No obvious electrolyte abnormality that would cause his  issue.  His mag is less than 2 so we will give a bolus dose of IV mag here.  Discussed risk and benefits of cardioversion patient electing to go forward at this time.  Cardioverted here without issue.  Will discharge home.  Cardiology follow-up.  CHA2DS2/VAS Stroke Risk Points  Current as of 53 minutes ago     4 >= 2 Points: High Risk  1 - 1.99 Points: Medium Risk  0 Points: Low Risk    No Change      Details    This score determines the patient's risk of having a stroke if the  patient has atrial fibrillation.       Points Metrics  0 Has Congestive Heart Failure:  No    Current as of 53 minutes ago  1 Has Vascular Disease:  Yes     Current as of 53 minutes ago  1 Has Hypertension:  Yes    Current as of 53 minutes ago  2 Age:  51    Current as of 53 minutes ago  0 Has Diabetes:  No    Current as of 53 minutes ago  0 Had Stroke:  No  Had TIA:  No  Had Thromboembolism:  No    Current as of 53 minutes ago  0 Male:  No    Current as of 53 minutes ago          2:17 PM:  I have discussed the diagnosis/risks/treatment options with the patient and family.  Evaluation and diagnostic testing in the emergency department does not suggest an emergent condition requiring admission or immediate intervention beyond what has been performed at this time.  They will follow up with PCP, Cards. We also discussed returning to the ED immediately if new or worsening sx occur. We discussed the sx which are most concerning (e.g., sudden worsening pain, fever,  inability to tolerate by mouth) that necessitate immediate return. Medications administered to the patient during their visit and any new prescriptions provided to the patient are listed below.  Medications given during this visit Medications  sodium chloride 0.9 % bolus 1,000 mL (0 mLs Intravenous Stopped 09/12/22 1350)  metoprolol tartrate (LOPRESSOR) injection 5 mg (5 mg Intravenous Given 09/12/22 1039)  propofol (DIPRIVAN) 10 mg/mL bolus/IV push 37 mg (37 mg Intravenous Given 09/12/22 1237)  magnesium sulfate IVPB 2 g 50 mL (0 g Intravenous Stopped 09/12/22 1350)     The patient appears reasonably screen and/or stabilized for discharge and I doubt any other medical condition or other [facility] requiring further screening, evaluation, or treatment in the ED at this time prior to discharge.         Final Clinical Impression(s) / ED Diagnoses Final diagnoses:  Atrial fibrillation with RVR    Rx / DC Orders ED Discharge Orders          Ordered    Ambulatory referral to Cardiology       Comments: If you have not heard from the Cardiology office within the next 72 hours please call [phone].   09/12/22 1414              Melene Plan, DO 09/12/22 1417

## 2022-09-12 NOTE — Progress Notes (Signed)
Rt present for conscious sedation.  Pt VS remained WNR throughout procedure.  RT will monitor as needed

## 2022-09-12 NOTE — Discharge Instructions (Signed)
Continue to take your Eliquis please follow-up with your cardiologist in the office.  I have given you information for the cardiologist in our system if you would prefer to see them.  I have also put an order in for them to call you to set up an appointment if you like.  Please return for chest pain or recurrent symptoms.

## 2022-09-12 NOTE — ED Triage Notes (Signed)
Pt arrived via ems for chest pain since 0300 pt has hx of afib on elequis received cardizem he now 89 still in afib pt denies any complaints of pain

## 2022-09-21 ENCOUNTER — Ambulatory Visit (HOSPITAL_COMMUNITY): Payer: No Typology Code available for payment source | Admitting: Internal Medicine

## 2022-09-24 ENCOUNTER — Ambulatory Visit (HOSPITAL_COMMUNITY)
Admission: RE | Admit: 2022-09-24 | Discharge: 2022-09-24 | Disposition: A | Discharge status: Home / Self Care | Payer: No Typology Code available for payment source | Source: Ambulatory Visit | Attending: Internal Medicine | Admitting: Internal Medicine

## 2022-09-24 VITALS — BP 136/64 | HR 62 | Ht 70.0 in | Wt 165.2 lb

## 2022-09-24 DIAGNOSIS — Z87891 Personal history of nicotine dependence: Secondary | ICD-10-CM | POA: Insufficient documentation

## 2022-09-24 DIAGNOSIS — Z7901 Long term (current) use of anticoagulants: Secondary | ICD-10-CM | POA: Insufficient documentation

## 2022-09-24 DIAGNOSIS — D6869 Other thrombophilia: Secondary | ICD-10-CM | POA: Insufficient documentation

## 2022-09-24 DIAGNOSIS — R0683 Snoring: Secondary | ICD-10-CM | POA: Diagnosis not present

## 2022-09-24 DIAGNOSIS — I4892 Unspecified atrial flutter: Secondary | ICD-10-CM | POA: Diagnosis not present

## 2022-09-24 DIAGNOSIS — I10 Essential (primary) hypertension: Secondary | ICD-10-CM | POA: Diagnosis not present

## 2022-09-24 DIAGNOSIS — I480 Paroxysmal atrial fibrillation: Secondary | ICD-10-CM | POA: Diagnosis not present

## 2022-09-24 DIAGNOSIS — K219 Gastro-esophageal reflux disease without esophagitis: Secondary | ICD-10-CM | POA: Insufficient documentation

## 2022-09-24 DIAGNOSIS — I4891 Unspecified atrial fibrillation: Secondary | ICD-10-CM | POA: Diagnosis not present

## 2022-09-24 DIAGNOSIS — J449 Chronic obstructive pulmonary disease, unspecified: Secondary | ICD-10-CM | POA: Insufficient documentation

## 2022-09-24 MED ORDER — METOPROLOL TARTRATE 25 MG PO TABS: ORAL_TABLET | ORAL | 1 refills | Status: DC

## 2022-09-24 NOTE — Patient Instructions (Signed)
Metoprolol  -- Take 1 tablet by mouth every 8 hours as needed for afib HR over 100 as long as top BP over 100

## 2022-09-24 NOTE — Progress Notes (Signed)
Primary Care Physician: Robert Adams., MD Primary Cardiologist: Dr. Allyson Adams Primary Electrophysiologist: None Referring Physician:    [name] is a 77 y.o. male with a history of COPD, HTN, history tobacco use, GERD, BPH, GAD, atrial flutter, and atrial fibrillation who presents for consultation in the [facility]. The patient went to the ED on 09/12/22 for chest discomfort and was found to be in Afib with RVR. He underwent successful cardioversion to normal sinus rhythm. Patient is on Eliquis 5 mg BID for a CHADS2VASC score of 4.  On evaluation today, he is currently in SR. He is doing well since ED visit and post DCCV. He has a Kardiamobile device at home for monitoring Afib. He notes prior to episode he has not had Afib in a while. Primary symptom that led to ED visit was his chest felt heavy. Since ED visit, he has felt well and denies any chest pain.   He drinks 1-2 cups of coffee daily. No alcohol. He states PCP currently trying to get him off Klonopin and he is happy to do so but cannot sleep without it. He is going to talk with PCP this week about it; declined sleep study initially ordered by PCP because wanted to deal with this first. He snores some but doesn't think he stops breathing and doesn't necessarily feel tired when he wakes up.   He is compliant with anticoagulation and has not missed any doses. He has no bleeding concerns.  Today, he denies symptoms of palpitations, chest pain, shortness of breath, orthopnea, PND, lower extremity edema, dizziness, presyncope, syncope, snoring, daytime somnolence, bleeding, or neurologic sequela. The patient is tolerating medications without difficulties and is otherwise without complaint today.   Atrial Fibrillation Risk Factors:  he does have symptoms or diagnosis of sleep apnea. he does not have a history of rheumatic fever. he does not have a history of alcohol use. The patient does not have a  history of early familial atrial fibrillation or other arrhythmias.  he has a BMI of Body mass index is 23.7 kg/m.Marland Kitchen Filed Weights   09/24/22 0852  Weight: 74.9 kg    Family History  Problem Relation Age of Onset   Emphysema Father        smoked   Rheum arthritis Mother    Colitis Mother    Colon polyps Mother 56   Heart disease Sister    Heart disease Brother    Liver disease Brother    Colon cancer Neg Hx    Esophageal cancer Neg Hx    Pancreatic cancer Neg Hx    Stomach cancer Neg Hx    Rectal cancer Neg Hx      Atrial Fibrillation Management history:  Previous antiarrhythmic drugs: None Previous cardioversions: 09/12/22 Previous ablations: None Anticoagulation history: Eliquis 5 mg BID   Past Medical History:  Diagnosis Date   Allergy    seasonal   Anxiety    Arthritis    BPH (benign prostatic hyperplasia)    COPD (chronic obstructive pulmonary disease) (HCC)    Depression    GERD (gastroesophageal reflux disease)    Glaucoma    Hypertension    Sleep apnea    Past Surgical History:  Procedure Laterality Date   APPENDECTOMY     COLONOSCOPY     12 yrs ago at least   EXTRACORPOREAL SHOCK WAVE LITHOTRIPSY Right 11/06/2021   Procedure: RIGHT EXTRACORPOREAL SHOCK WAVE LITHOTRIPSY (ESWL);  Surgeon: Robert Pippin, MD;  Location: [facility];  Service: Urology;  Laterality: Right;   HERNIA REPAIR     bilat ing    KNEE ARTHROSCOPY     rt and lt   LEFT HEART CATH AND CORONARY ANGIOGRAPHY N/A 08/05/2017   Procedure: LEFT HEART CATH AND CORONARY ANGIOGRAPHY;  Surgeon: Robert Gess, MD;  Location: [facility];  Service: Cardiovascular;  Laterality: N/A;   SHOULDER SURGERY Right    x 2    Current Outpatient Medications  Medication Sig Dispense Refill   albuterol (PROVENTIL HFA;VENTOLIN HFA) 108 (90 Base) MCG/ACT inhaler Inhale 2 puffs into the lungs every 6 (six) hours as needed for wheezing or shortness of breath.      apixaban (ELIQUIS) 5  MG TABS tablet TAKE ONE TABLET BY MOUTH TWICE A DAY (CAUTION: BLOOD THINNER)     augmented betamethasone dipropionate (DIPROLENE-AF) 0.05 % cream APPLY SMALL AMOUNT TO AFFECTED AREA TWICE A DAY     clonazePAM (KLONOPIN) 0.5 MG tablet TAKE ONE AND ONE-HALF TABLETS BY MOUTH DAILY CALL FOR REFILL     cyanocobalamin 2000 MCG tablet Take 2,500 mcg by mouth as needed. 2,545mcg     doxycycline (VIBRAMYCIN) 100 MG capsule Take 100 mg by mouth daily.     finasteride (PROSCAR) 5 MG tablet Take 5 mg by mouth every evening.     Fluticasone-Umeclidin-Vilant (TRELEGY ELLIPTA) 100-62.5-25 MCG/ACT AEPB Inhale 1 puff into the lungs in the morning. 1 each 0   metoprolol tartrate (LOPRESSOR) 25 MG tablet Take 1 tablet by mouth every 8 hours as needed for afib HR over 100 as long as top BP over 100 30 tablet 1   Multiple Vitamins-Minerals (PRESERVISION AREDS 2 PO) Take 1 capsule by mouth daily.     pantoprazole (PROTONIX) 40 MG tablet Take 1 tablet (40 mg total) by mouth daily. Take 30-60 min before first meal of the day 30 tablet 2   Tamsulosin HCl (FLOMAX) 0.4 MG CAPS Take 0.4 mg by mouth at bedtime.     No current facility-administered medications for this encounter.    Allergies  Allergen Reactions   Grass Extracts [Gramineae Pollens]     Other reaction(s): Other (See Comments) Sneezing   Penicillins Palpitations    Social History   Socioeconomic History   Marital status: Married    Spouse name: Not on file   Number of children: Not on file   Years of education: Not on file   Highest education level: Not on file  Occupational History   Not on file  Tobacco Use   Smoking status: Former    Packs/day: 1.00    Years: 30.00    Additional pack years: 0.00    Total pack years: 30.00    Types: Cigarettes    Quit date: 06/04/2010    Years since quitting: 12.3   Smokeless tobacco: Never  Vaping Use   Vaping Use: Never used  Substance and Sexual Activity   Alcohol use: No    Comment: rare   Drug  use: No   Sexual activity: Not on file  Other Topics Concern   Not on file  Social History Narrative   Not on file   Social Determinants of Health   Financial Resource Strain: Not on file  Food Insecurity: Not on file  Transportation Needs: Not on file  Physical Activity: Not on file  Stress: Not on file  Social Connections: Not on file  Intimate Partner Violence: Not on file  ROS- All systems are reviewed and negative except as per the HPI above.  Physical Exam: Vitals:   09/24/22 0852  BP: 136/64  Pulse: 62  Weight: 74.9 kg  Height:  (1.778 m)   GEN- The patient is well appearing, alert and oriented x 3 today.   Head- normocephalic, atraumatic Eyes-  Sclera clear, conjunctiva pink Ears- hearing intact Oropharynx- clear Neck- supple, no JVP Lymph- no cervical lymphadenopathy Lungs- Clear to ausculation bilaterally, normal work of breathing Heart- Regular rate and rhythm, no murmurs, rubs or gallops, PMI not laterally displaced GI- soft, NT, ND, + BS Extremities- no clubbing, cyanosis, or edema MS- no significant deformity or atrophy Skin- no rash or lesion Psych- euthymic mood, full affect Neuro- strength and sensation are intact   Wt Readings from Last 3 Encounters:  09/24/22 74.9 kg  09/12/22 73.9 kg  08/15/22 74.8 kg    EKG today demonstrates Vent. rate 62 BPM PR interval 160 ms QRS duration 122 ms QT/QTcB 420/426 ms P-R-T axes 75 -22 -23 Normal sinus rhythm Left ventricular hypertrophy with QRS widening and repolarization abnormality ( Cornell product ) Cannot rule out Septal infarct , age undetermined Abnormal ECG When compared with ECG of 12-Sep-2022 12:39, PREVIOUS ECG IS PRESENT  Echo 06/24/17 demonstrated:   Epic records are reviewed at length today.  CHA2DS2-VASc Score = 4  The patient's score is based upon: CHF History: 0 HTN History: 1 Diabetes History: 0 Stroke History: 0 Vascular Disease History: 1 Age Score: 2 Gender  Score: 0       ASSESSMENT AND PLAN: Paroxysmal Atrial Fibrillation (ICD10:  I48.0) The patient's CHA2DS2-VASc score is 4, indicating a 4.8% annual risk of stroke.    Education provided about Afib with visual diagram. Discussion about medication treatments and ablation in detail. After discussion, we will proceed with conservative observation at this time.  PRN lopressor 25 mg for palpitations  Secondary Hypercoagulable State (ICD10:  D68.69) The patient is at significant risk for stroke/thromboembolism based upon his CHA2DS2-VASc Score of 4.  Continue Apixaban (Eliquis).   No missed doses.  4. Snoring with concern for possible obstructive sleep apnea He is going to sort out situation with PCP regarding Klonopin first and then consider sleep study in the future.  5. HTN Stable today   Follow up in 4 months Afib clinic.    Robert Bells, PA-C Afib Clinic [facility] [address] [place], [place] [address] [phone] 09/24/2022 9:55 AM

## 2022-11-21 ENCOUNTER — Encounter: Payer: Self-pay | Admitting: Internal Medicine

## 2023-01-21 ENCOUNTER — Ambulatory Visit (HOSPITAL_COMMUNITY): Payer: No Typology Code available for payment source | Admitting: Internal Medicine

## 2023-06-03 ENCOUNTER — Telehealth: Payer: Self-pay | Admitting: Internal Medicine

## 2023-06-03 NOTE — Telephone Encounter (Signed)
Vickie states patient having shortness of breath. Pharmacy is [facility] No available appointments at this time. Vickie phone number is [phone].

## 2023-06-03 NOTE — Telephone Encounter (Signed)
Called and spoke with pts wife as listed in [place]. Pt states she wanted to schedule her husband an appt.   Appt has been scheduled nfn

## 2023-06-12 NOTE — Progress Notes (Signed)
 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 Tanda Loring. Pt c/o SOB x 4 months. He states he gets SOB if he walks too fast, walks up an incline or does too much work.    doe = MMRC1 = can walk nl pace, flat grade, can't hurry or go up hills or steps s sob mb and back 200 ft slt grade walking back to house but not so sob he has to stop when gets back  Some better after saba/ no need at rest or noct / now rarely using saba  Rec Plan A = Automatic if having bad enough breathing to justify it = symbicort  160 Take 2 puffs first thing in am and then another 2 puffs about 12 hours later.  Work on inhaler technique:    Plan B = Backup Only use your albuterol  (proair ) as a rescue medication    06/27/2022  f/u ov/Taniya Dasher re: copd    maint on trelegy  200 Chief Complaint  Patient presents with   Follow-up    Review PFT today.  Needs refill for Trelegy.  This helps sx. Doing well.  Dis have episode of A-Fib, now on Eloquis  Dyspnea: MMRC1 = can walk nl pace, flat grade, can't hurry or go uphills or steps s sob   Cough:  hoarseness / some throat clearing but no significant production  Sleeping: well p clonazepam  SABA use: rarely  02: none  Covid status:   never shots/ one infection Lung cancer screening :  [facility]  Rec Plan A = Automatic = Always=    Trelegy 100 one click each am  Plan B = Backup (to supplement plan A, not to replace it) Only use your albuterol  inhaler as a rescue medication Plan C = Crisis (instead of Plan B but  only if Plan B stops working) - only use your albuterol  nebulizer if you first try Plan B  Plan D = Doctor - call me if B and C not adequate     06/13/2023  f/u ov/Linsy Ehresman re: GOLD 3 copd maint on duoneb tid/ stopped trelegy   Chief Complaint  Patient presents with   Follow-up    SOB x 6 weeks.  Seen at [facility].  Rx Prednisone  and Ipratropium Bromide/Albuterol  /sulfate 0.5mg /3mg  per 3 ml.  Dyspnea:  mb and back x 200 ft each way uphill back to house stops half wa  Cough: barking cough / hoarseness / assoc rhinitis with pnds x years  Sleeping: trazadone on side slt increase HOB electric s  resp cc  SABA use: neb 3 x time  02: none     No obvious day to day or daytime variability or assoc excess/ purulent sputum or mucus plugs or hemoptysis or cp or chest tightness, subjective wheeze or overt   hb symptoms.    Also denies any obvious fluctuation of symptoms with weather or environmental changes or  other aggravating or alleviating factors except as outlined above   No unusual exposure hx or h/o childhood pna/ asthma or knowledge of premature birth.  Current Allergies, Complete Past Medical History, Past Surgical History, Family History, and Social History were reviewed in [employer] record.  ROS  The following are not active complaints unless bolded Hoarseness, sore throat, dysphagia, dental problems, itching, sneezing,  nasal congestion or discharge of excess mucus or purulent secretions, ear ache,   fever, chills, sweats, unintended wt loss or wt gain, classically pleuritic or exertional cp,  orthopnea pnd or arm/hand swelling  or leg swelling, presyncope, palpitations, abdominal pain, anorexia, nausea, vomiting, diarrhea  or change in bowel habits or change in bladder habits, change in stools or change in urine, dysuria, hematuria,  rash, arthralgias, visual complaints, headache, numbness, weakness or ataxia or problems with walking (neuropathy) or  coordination,  change in mood or  memory.        Current Meds  Medication Sig   albuterol  (PROVENTIL  HFA;VENTOLIN  HFA) 108 (90 Base) MCG/ACT inhaler Inhale 2 puffs into the lungs every 6 (six) hours as needed for wheezing or shortness of breath.    apixaban  (ELIQUIS ) 5 MG TABS tablet TAKE ONE TABLET BY MOUTH TWICE A DAY (CAUTION: BLOOD THINNER)   augmented betamethasone dipropionate (DIPROLENE-AF) 0.05 % cream APPLY SMALL AMOUNT TO AFFECTED AREA TWICE A DAY   cyanocobalamin 2000 MCG tablet Take 2,500 mcg by mouth as needed. 2,531mcg   escitalopram  (LEXAPRO ) 10 MG tablet Take 10 mg by mouth daily.   finasteride  (PROSCAR ) 5 MG tablet Take 5 mg by mouth every evening.    Duoneb three times daily     metoprolol  tartrate (LOPRESSOR ) 25 MG tablet Take 1 tablet by mouth every 8 hours as needed for afib HR over 100 as long as top BP over 100   Multiple Vitamins-Minerals (PRESERVISION AREDS 2 PO) Take 1 capsule by mouth daily.   Tamsulosin  HCl (FLOMAX ) 0.4 MG CAPS Take 0.4 mg by mouth at bedtime.   traZODone  (DESYREL ) 50 MG tablet Take 50 mg by mouth at bedtime.           Objective:   Physical Exam    Wts  06/13/2023           165  06/27/2022        168  03/06/2022        170 03/31/2021     166  03/06/2021       164   12/27/16 167 lb (75.8 kg)  10/22/16 165 lb (74.8 kg)  06/24/16 160 lb (72.6 kg)     Vital signs reviewed  06/13/2023  - Note at rest 02 sats  95% on RA   General appearance:    amb anxious wm nad   HEENT :  Oropharynx  clear   Nasal turbinates nl    NECK :  without JVD/Nodes/TM/ nl carotid upstrokes bilaterally   LUNGS: no acc muscle use,  Mod barrel  contour chest wall with bilateral  Distant bs s audible wheeze and  without cough on insp or exp maneuvers and mod  Hyperresonant  to  percussion bilaterally     CV:  RRR  no s3 or murmur or increase in P2, and no edema   ABD:  soft and nontender with pos mid insp Hoover's  in the supine position. No bruits or organomegaly  appreciated, bowel sounds nl  MS:   Ext warm without deformities or   obvious  joint restrictions , calf tenderness, cyanosis or clubbing  SKIN: warm and dry without lesions    NEURO:  alert, approp, nl sensorium with  no motor or cerebellar deficits apparent.               Assessment:

## 2023-06-13 ENCOUNTER — Ambulatory Visit: Payer: PPO | Admitting: Internal Medicine

## 2023-06-13 ENCOUNTER — Encounter: Payer: Self-pay | Admitting: Internal Medicine

## 2023-06-13 VITALS — BP 136/64 | HR 70 | Temp 98.1°F | Ht 70.5 in | Wt 165.2 lb

## 2023-06-13 DIAGNOSIS — J449 Chronic obstructive pulmonary disease, unspecified: Secondary | ICD-10-CM | POA: Diagnosis not present

## 2023-06-13 MED ORDER — STIOLTO RESPIMAT 2.5-2.5 MCG/ACT IN AERS: INHALATION_SPRAY | RESPIRATORY_TRACT | 11 refills | Status: DC

## 2023-06-13 MED ORDER — STIOLTO RESPIMAT 2.5-2.5 MCG/ACT IN AERS: 2.0000 | INHALATION_SPRAY | Freq: Every day | RESPIRATORY_TRACT | Status: DC

## 2023-06-13 NOTE — Patient Instructions (Addendum)
 Stiolto 2 puffs each am until return in 3 months  Work on inhaler technique:  relax and gently blow all the way out then take a nice smooth full deep breath back in, triggering the inhaler at same time you start breathing in.  Hold breath in for at least  5 seconds if you can.   Also  Ok to try albuterol  15 min before an activity (on alternating days)  that you know would usually make you short of breath(mailbox)  and see if it makes any difference and if makes none then don't take albuterol  after activity unless you can't catch your breath as this means it's the resting that helps, not the albuterol .  Please schedule a follow up visit in 3 months but call sooner if needed for referral to pulmonary rehab  Add : consider gabapentin for painful neuropathy

## 2023-06-14 NOTE — Assessment & Plan Note (Signed)
 Quit smoking  2012 /MZ - Spirometry 12/27/2016  FEV1 1.45 (45%)  Ratio 47  With no rx prior  - 03/06/2021  D/c trelegy 200 and trial of symb 80 2bid with prn saba  - 03/06/2021   Walked on RA x  3  lap(s) =  approx 750 @ fast pace, stopped due to sob with lowest 02 sats 89% - 03/13/21  CTa  Neg x for ASCVD/ emphysema  - Labs ordered 03/31/2021  :  allergy profile IgE  20  Eos 0.1    alpha one AT phenotype  MZ / level 94 so rec pft then consider replacement rx  - PFT's  06/27/2022   FEV1 1.40 (48 % ) ratio 0.50   p Trelegy 100/saba prior to study with DLCO  9.98 (40%)   and FV curve classically concave    - 06/13/2023  After extensive coaching inhaler device,  effectiveness =    75% with smi > try stiolto x 2 weeks and call for referral to pulmonary rehab if willing   06/13/2023   Walked on RA  x  2  lap(s) =  approx 500  ft  @ brisk pace, stopped due to feet hurt with lowest 02 sats 94%    Pt is Group B in terms of symptom/risk and laba/lama therefore appropriate rx at this point >>>  stiolto best choice if improves his ex tol and lowers his dependency on neb/ really needs to go to pulmonary rehab if willing   F/u in 3 m, call sooner if needed       Each maintenance medication was reviewed in detail including emphasizing most importantly the difference between maintenance and prns and under what circumstances the prns are to be triggered using an action plan format where appropriate.  Total time for H and P, chart review, counseling, reviewing smi/ neb/hfa device(s) , directly observing portions of ambulatory 02 saturation study/ and generating customized AVS unique to this office visit / same day charting  > 30 min

## 2023-06-18 ENCOUNTER — Other Ambulatory Visit: Payer: Self-pay

## 2023-06-18 ENCOUNTER — Inpatient Hospital Stay (HOSPITAL_COMMUNITY): Payer: No Typology Code available for payment source

## 2023-06-18 ENCOUNTER — Inpatient Hospital Stay (HOSPITAL_COMMUNITY)
Admission: EM | Admit: 2023-06-18 | Discharge: 2023-06-21 | DRG: 280 | Disposition: A | Discharge status: Home / Self Care | Payer: No Typology Code available for payment source | Attending: Internal Medicine | Admitting: Internal Medicine

## 2023-06-18 ENCOUNTER — Encounter (HOSPITAL_COMMUNITY): Payer: Self-pay | Admitting: Emergency Medicine

## 2023-06-18 ENCOUNTER — Emergency Department (HOSPITAL_COMMUNITY): Payer: No Typology Code available for payment source

## 2023-06-18 ENCOUNTER — Other Ambulatory Visit (HOSPITAL_COMMUNITY): Payer: PPO

## 2023-06-18 DIAGNOSIS — K219 Gastro-esophageal reflux disease without esophagitis: Secondary | ICD-10-CM | POA: Diagnosis present

## 2023-06-18 DIAGNOSIS — G4730 Sleep apnea, unspecified: Secondary | ICD-10-CM | POA: Diagnosis present

## 2023-06-18 DIAGNOSIS — Z888 Allergy status to other drugs, medicaments and biological substances status: Secondary | ICD-10-CM

## 2023-06-18 DIAGNOSIS — I428 Other cardiomyopathies: Secondary | ICD-10-CM | POA: Diagnosis present

## 2023-06-18 DIAGNOSIS — J441 Chronic obstructive pulmonary disease with (acute) exacerbation: Secondary | ICD-10-CM | POA: Diagnosis present

## 2023-06-18 DIAGNOSIS — I480 Paroxysmal atrial fibrillation: Secondary | ICD-10-CM | POA: Diagnosis present

## 2023-06-18 DIAGNOSIS — I10 Essential (primary) hypertension: Secondary | ICD-10-CM | POA: Diagnosis not present

## 2023-06-18 DIAGNOSIS — J9601 Acute respiratory failure with hypoxia: Secondary | ICD-10-CM | POA: Diagnosis present

## 2023-06-18 DIAGNOSIS — I452 Bifascicular block: Secondary | ICD-10-CM | POA: Diagnosis present

## 2023-06-18 DIAGNOSIS — Z87891 Personal history of nicotine dependence: Secondary | ICD-10-CM

## 2023-06-18 DIAGNOSIS — I5043 Acute on chronic combined systolic (congestive) and diastolic (congestive) heart failure: Secondary | ICD-10-CM | POA: Diagnosis present

## 2023-06-18 DIAGNOSIS — J302 Other seasonal allergic rhinitis: Secondary | ICD-10-CM | POA: Diagnosis present

## 2023-06-18 DIAGNOSIS — T380X5A Adverse effect of glucocorticoids and synthetic analogues, initial encounter: Secondary | ICD-10-CM | POA: Diagnosis present

## 2023-06-18 DIAGNOSIS — H409 Unspecified glaucoma: Secondary | ICD-10-CM | POA: Diagnosis present

## 2023-06-18 DIAGNOSIS — R0609 Other forms of dyspnea: Secondary | ICD-10-CM

## 2023-06-18 DIAGNOSIS — I2510 Atherosclerotic heart disease of native coronary artery without angina pectoris: Secondary | ICD-10-CM | POA: Diagnosis present

## 2023-06-18 DIAGNOSIS — Z79899 Other long term (current) drug therapy: Secondary | ICD-10-CM

## 2023-06-18 DIAGNOSIS — R7989 Other specified abnormal findings of blood chemistry: Secondary | ICD-10-CM

## 2023-06-18 DIAGNOSIS — I5030 Unspecified diastolic (congestive) heart failure: Secondary | ICD-10-CM | POA: Diagnosis present

## 2023-06-18 DIAGNOSIS — Z1152 Encounter for screening for COVID-19: Secondary | ICD-10-CM

## 2023-06-18 DIAGNOSIS — F419 Anxiety disorder, unspecified: Secondary | ICD-10-CM | POA: Diagnosis present

## 2023-06-18 DIAGNOSIS — I21A1 Myocardial infarction type 2: Secondary | ICD-10-CM | POA: Diagnosis present

## 2023-06-18 DIAGNOSIS — Z8249 Family history of ischemic heart disease and other diseases of the circulatory system: Secondary | ICD-10-CM | POA: Diagnosis not present

## 2023-06-18 DIAGNOSIS — R112 Nausea with vomiting, unspecified: Secondary | ICD-10-CM | POA: Diagnosis not present

## 2023-06-18 DIAGNOSIS — Z8616 Personal history of COVID-19: Secondary | ICD-10-CM | POA: Diagnosis not present

## 2023-06-18 DIAGNOSIS — N400 Enlarged prostate without lower urinary tract symptoms: Secondary | ICD-10-CM | POA: Diagnosis present

## 2023-06-18 DIAGNOSIS — Z7952 Long term (current) use of systemic steroids: Secondary | ICD-10-CM

## 2023-06-18 DIAGNOSIS — Z880 Allergy status to penicillin: Secondary | ICD-10-CM

## 2023-06-18 DIAGNOSIS — Z7901 Long term (current) use of anticoagulants: Secondary | ICD-10-CM

## 2023-06-18 DIAGNOSIS — U071 COVID-19: Secondary | ICD-10-CM | POA: Diagnosis present

## 2023-06-18 DIAGNOSIS — I5032 Chronic diastolic (congestive) heart failure: Secondary | ICD-10-CM | POA: Diagnosis not present

## 2023-06-18 DIAGNOSIS — I4892 Unspecified atrial flutter: Secondary | ICD-10-CM | POA: Diagnosis present

## 2023-06-18 DIAGNOSIS — I110 Hypertensive heart disease with heart failure: Secondary | ICD-10-CM | POA: Diagnosis present

## 2023-06-18 DIAGNOSIS — Z9049 Acquired absence of other specified parts of digestive tract: Secondary | ICD-10-CM

## 2023-06-18 DIAGNOSIS — R001 Bradycardia, unspecified: Secondary | ICD-10-CM | POA: Diagnosis not present

## 2023-06-18 DIAGNOSIS — Z825 Family history of asthma and other chronic lower respiratory diseases: Secondary | ICD-10-CM

## 2023-06-18 DIAGNOSIS — I5021 Acute systolic (congestive) heart failure: Secondary | ICD-10-CM | POA: Diagnosis not present

## 2023-06-18 LAB — ECHOCARDIOGRAM COMPLETE
AR max vel: 2.81 cm2
AV Area VTI: 2.78 cm2
AV Area mean vel: 2.69 cm2
AV Mean grad: 2 mm[Hg]
AV Peak grad: 3.5 mm[Hg]
Ao pk vel: 0.93 m/s
Height: 70.5 in
MV VTI: 2.19 cm2
S' Lateral: 4.3 cm
Weight: 2645.52 [oz_av]

## 2023-06-18 LAB — CBC WITH DIFFERENTIAL/PLATELET
Abs Immature Granulocytes: 0.1 10*3/uL — ABNORMAL HIGH (ref 0.00–0.07)
Basophils Absolute: 0 10*3/uL (ref 0.0–0.1)
Basophils Relative: 0 %
Eosinophils Absolute: 0 10*3/uL (ref 0.0–0.5)
Eosinophils Relative: 0 %
HCT: 43.6 % (ref 39.0–52.0)
Hemoglobin: 14.6 g/dL (ref 13.0–17.0)
Immature Granulocytes: 1 %
Lymphocytes Relative: 5 %
Lymphs Abs: 0.6 10*3/uL — ABNORMAL LOW (ref 0.7–4.0)
MCH: 29.6 pg (ref 26.0–34.0)
MCHC: 33.5 g/dL (ref 30.0–36.0)
MCV: 88.4 fL (ref 80.0–100.0)
Monocytes Absolute: 1.7 10*3/uL — ABNORMAL HIGH (ref 0.1–1.0)
Monocytes Relative: 13 %
Neutro Abs: 10.6 10*3/uL — ABNORMAL HIGH (ref 1.7–7.7)
Neutrophils Relative %: 81 %
Platelets: 313 10*3/uL (ref 150–400)
RBC: 4.93 MIL/uL (ref 4.22–5.81)
RDW: 13.8 % (ref 11.5–15.5)
WBC: 13 10*3/uL — ABNORMAL HIGH (ref 4.0–10.5)
nRBC: 0 % (ref 0.0–0.2)

## 2023-06-18 LAB — RESP PANEL BY RT-PCR (RSV, FLU A&B, COVID)  RVPGX2
Influenza A by PCR: NEGATIVE
Influenza B by PCR: NEGATIVE
Resp Syncytial Virus by PCR: NEGATIVE
SARS Coronavirus 2 by RT PCR: POSITIVE — AB

## 2023-06-18 LAB — BASIC METABOLIC PANEL
Anion gap: 12 (ref 5–15)
BUN: 15 mg/dL (ref 8–23)
CO2: 19 mmol/L — ABNORMAL LOW (ref 22–32)
Calcium: 8.9 mg/dL (ref 8.9–10.3)
Chloride: 104 mmol/L (ref 98–111)
Creatinine, Ser: 0.87 mg/dL (ref 0.61–1.24)
GFR, Estimated: >60 mL/min (ref >60)
Glucose, Bld: 97 mg/dL (ref 70–99)
Potassium: 4.2 mmol/L (ref 3.5–5.1)
Sodium: 135 mmol/L (ref 135–145)

## 2023-06-18 LAB — PROCALCITONIN: Procalcitonin: 0.15 ng/mL

## 2023-06-18 LAB — BRAIN NATRIURETIC PEPTIDE: B Natriuretic Peptide: 365.8 pg/mL — ABNORMAL HIGH (ref 0.0–100.0)

## 2023-06-18 LAB — TROPONIN I (HIGH SENSITIVITY)
Troponin I (High Sensitivity): 353 ng/L (ref <18)
Troponin I (High Sensitivity): 659 ng/L (ref <18)

## 2023-06-18 MED ORDER — METOPROLOL TARTRATE 5 MG/5ML IV SOLN
2.5000 mg | Freq: Once | INTRAVENOUS | Status: AC
  Administered 2023-06-18: 2.5 mg via INTRAVENOUS
  Filled 2023-06-18: qty 5

## 2023-06-18 MED ORDER — METOPROLOL TARTRATE 5 MG/5ML IV SOLN
2.5000 mg | INTRAVENOUS | Status: DC | PRN
  Administered 2023-06-19: 2.5 mg via INTRAVENOUS
  Filled 2023-06-18: qty 5

## 2023-06-18 MED ORDER — TAMSULOSIN HCL 0.4 MG PO CAPS
0.4000 mg | ORAL_CAPSULE | Freq: Every day | ORAL | Status: DC
  Administered 2023-06-18 – 2023-06-20 (×3): 0.4 mg via ORAL
  Filled 2023-06-18 (×3): qty 1

## 2023-06-18 MED ORDER — ONDANSETRON HCL 4 MG PO TABS: 4.0000 mg | ORAL_TABLET | Freq: Four times a day (QID) | ORAL | Status: DC | PRN

## 2023-06-18 MED ORDER — METHYLPREDNISOLONE SODIUM SUCC 125 MG IJ SOLR
1.0000 mg/kg | Freq: Two times a day (BID) | INTRAMUSCULAR | Status: DC
  Administered 2023-06-18 – 2023-06-19 (×2): 75 mg via INTRAVENOUS
  Filled 2023-06-18 (×2): qty 2

## 2023-06-18 MED ORDER — APIXABAN 5 MG PO TABS
5.0000 mg | ORAL_TABLET | Freq: Two times a day (BID) | ORAL | Status: DC
  Administered 2023-06-18: 5 mg via ORAL
  Filled 2023-06-18: qty 1

## 2023-06-18 MED ORDER — HEPARIN (PORCINE) 25000 UT/250ML-% IV SOLN
950.0000 [IU]/h | INTRAVENOUS | Status: DC
  Administered 2023-06-19: 1100 [IU]/h via INTRAVENOUS
  Administered 2023-06-20: 950 [IU]/h via INTRAVENOUS
  Filled 2023-06-18 (×2): qty 250

## 2023-06-18 MED ORDER — SODIUM CHLORIDE 0.9 % IV SOLN
2.0000 g | Freq: Every day | INTRAVENOUS | Status: DC
  Administered 2023-06-18 – 2023-06-19 (×2): 2 g via INTRAVENOUS
  Filled 2023-06-18 (×2): qty 20

## 2023-06-18 MED ORDER — PREDNISONE 5 MG PO TABS: 50.0000 mg | ORAL_TABLET | Freq: Every day | ORAL | Status: DC

## 2023-06-18 MED ORDER — SODIUM CHLORIDE 0.9 % IV SOLN
100.0000 mg | Freq: Every day | INTRAVENOUS | Status: DC
Start: 2023-06-19 — End: 2023-06-18

## 2023-06-18 MED ORDER — ACETAMINOPHEN 325 MG PO TABS: 650.0000 mg | ORAL_TABLET | Freq: Four times a day (QID) | ORAL | Status: DC | PRN

## 2023-06-18 MED ORDER — FINASTERIDE 5 MG PO TABS
5.0000 mg | ORAL_TABLET | Freq: Every evening | ORAL | Status: DC
  Administered 2023-06-18 – 2023-06-19 (×2): 5 mg via ORAL
  Filled 2023-06-18 (×2): qty 1

## 2023-06-18 MED ORDER — FAMOTIDINE IN NACL 20-0.9 MG/50ML-% IV SOLN
20.0000 mg | Freq: Two times a day (BID) | INTRAVENOUS | Status: DC
  Administered 2023-06-18 – 2023-06-21 (×6): 20 mg via INTRAVENOUS
  Filled 2023-06-18 (×8): qty 50

## 2023-06-18 MED ORDER — ARFORMOTEROL TARTRATE 15 MCG/2ML IN NEBU
15.0000 ug | INHALATION_SOLUTION | Freq: Two times a day (BID) | RESPIRATORY_TRACT | Status: DC
  Administered 2023-06-18 – 2023-06-21 (×7): 15 ug via RESPIRATORY_TRACT
  Filled 2023-06-18 (×7): qty 2

## 2023-06-18 MED ORDER — SODIUM CHLORIDE 0.9 % IV SOLN
200.0000 mg | Freq: Once | INTRAVENOUS | Status: DC
  Filled 2023-06-18: qty 40

## 2023-06-18 MED ORDER — ESCITALOPRAM OXALATE 10 MG PO TABS
10.0000 mg | ORAL_TABLET | Freq: Every day | ORAL | Status: DC
  Administered 2023-06-18 – 2023-06-21 (×3): 10 mg via ORAL
  Filled 2023-06-18 (×3): qty 1

## 2023-06-18 MED ORDER — GUAIFENESIN-CODEINE 100-10 MG/5ML PO SOLN
5.0000 mL | Freq: Four times a day (QID) | ORAL | Status: DC | PRN
  Administered 2023-06-20 – 2023-06-21 (×2): 5 mL via ORAL
  Filled 2023-06-18 (×3): qty 5

## 2023-06-18 MED ORDER — ACETAMINOPHEN 650 MG RE SUPP: 650.0000 mg | Freq: Four times a day (QID) | RECTAL | Status: DC | PRN

## 2023-06-18 MED ORDER — TRAZODONE HCL 50 MG PO TABS
50.0000 mg | ORAL_TABLET | Freq: Every day | ORAL | Status: DC
  Administered 2023-06-18 – 2023-06-20 (×3): 50 mg via ORAL
  Filled 2023-06-18 (×3): qty 1

## 2023-06-18 MED ORDER — METOPROLOL TARTRATE 25 MG PO TABS
25.0000 mg | ORAL_TABLET | Freq: Two times a day (BID) | ORAL | Status: DC
  Administered 2023-06-18 (×2): 25 mg via ORAL
  Filled 2023-06-18 (×2): qty 1

## 2023-06-18 MED ORDER — FUROSEMIDE 10 MG/ML IJ SOLN
20.0000 mg | Freq: Once | INTRAMUSCULAR | Status: AC
  Administered 2023-06-18: 20 mg via INTRAVENOUS
  Filled 2023-06-18: qty 2

## 2023-06-18 MED ORDER — ONDANSETRON 4 MG PO TBDP
4.0000 mg | ORAL_TABLET | Freq: Once | ORAL | Status: AC
  Administered 2023-06-18: 4 mg via ORAL
  Filled 2023-06-18: qty 1

## 2023-06-18 MED ORDER — UMECLIDINIUM BROMIDE 62.5 MCG/ACT IN AEPB
1.0000 | INHALATION_SPRAY | Freq: Every day | RESPIRATORY_TRACT | Status: DC
  Administered 2023-06-19 – 2023-06-20 (×2): 1 via RESPIRATORY_TRACT
  Filled 2023-06-18 (×2): qty 7

## 2023-06-18 MED ORDER — SODIUM CHLORIDE 0.9% FLUSH
3.0000 mL | Freq: Two times a day (BID) | INTRAVENOUS | Status: DC
  Administered 2023-06-18 – 2023-06-20 (×6): 3 mL via INTRAVENOUS

## 2023-06-18 MED ORDER — ONDANSETRON HCL 4 MG/2ML IJ SOLN: 4.0000 mg | Freq: Four times a day (QID) | INTRAMUSCULAR | Status: DC | PRN

## 2023-06-18 NOTE — ED Notes (Signed)
 I notified Dr Katrinka Blazing pt is in sinus tachycardia up to 147. I did an EKG and obtained orders from Dr Katrinka Blazing

## 2023-06-18 NOTE — Progress Notes (Addendum)
 PHARMACY - ANTICOAGULATION CONSULT NOTE  Pharmacy Consult for Heparin  Indication: atrial fibrillation  Allergies  Allergen Reactions   Grass Extracts [Gramineae Pollens]     Other reaction(s): Other (See Comments) Sneezing   Mirtazapine Rash   Penicillins Palpitations    Patient Measurements: Height: 5' 10.5 (179.1 cm) Weight: 75 kg (165 lb 5.5 oz) IBW/kg (Calculated) : 74.15 Heparin  Dosing Weight: 75kg  Vital Signs: Temp: 98.7 F (37.1 C) (01/14 1446) Temp Source: Oral (01/14 1446) BP: 130/80 (01/14 1615) Pulse Rate: 148 (01/14 1615)  Labs: Recent Labs    06/18/23 0305 06/18/23 0637  HGB 14.6  --   HCT 43.6  --   PLT 313  --   CREATININE 0.87  --   TROPONINIHS 353* 659*    Estimated Creatinine Clearance: 71.1 mL/min (by C-G formula based on SCr of 0.87 mg/dL).   Medical History: Past Medical History:  Diagnosis Date   Allergy    seasonal   Anxiety    Arthritis    BPH (benign prostatic hyperplasia)    COPD (chronic obstructive pulmonary disease) (HCC)    Depression    GERD (gastroesophageal reflux disease)    Glaucoma    Hypertension    Sleep apnea     Medications:  Scheduled:   arformoterol   15 mcg Nebulization BID   And   umeclidinium bromide   1 puff Inhalation Daily   escitalopram   10 mg Oral Daily   finasteride   5 mg Oral QPM   methylPREDNISolone  (SOLU-MEDROL ) injection  1 mg/kg Intravenous Q12H   Followed by   NOREEN ON 06/21/2023] predniSONE   50 mg Oral Daily   metoprolol  tartrate  25 mg Oral BID   sodium chloride  flush  3 mL Intravenous Q12H   tamsulosin   0.4 mg Oral QHS   traZODone   50 mg Oral QHS   Infusions:   cefTRIAXone  (ROCEPHIN )  IV Stopped (06/18/23 1656)   famotidine  (PEPCID ) IV Stopped (06/18/23 1653)    Assessment: 78YOM with h/o afib on Eliquis  admitted with COVID. Last Eliquis  dose was today in the ED @ 1243. Pharmacy has been consulted to dose heparin .  Hgb 14.6, plt 313.   Goal of Therapy:  Heparin  level 0.3-0.7  units/ml aPTT 66-102 seconds Monitor platelets by anticoagulation protocol: Yes   Plan:  Start heparin  infusion at 1100 units/hr @ 1/15 0000 Check aPTT and heparin  level in 8 hours from start of heparin  infusion and daily while on heparin  Continue to monitor H&H and platelets  Harlene VEAR Mandril 06/18/2023,5:01 PM

## 2023-06-18 NOTE — H&P (Signed)
 History and Physical    Patient: Robert Adams FMW:[id number] DOB: 12/06/1945 DOA: 06/18/2023 DOS: the patient was seen and examined on 06/18/2023 PCP: Henry Tanda FORBES Mickey., MD  Patient coming from: Home via EMS  Chief Complaint:  Chief Complaint  Patient presents with   Shortness of Breath   HPI: Robert Adams is a 78 y.o. male with medical history significant of hypertension COPD, remote tobacco abuse, BPH, and GERD presented to the hospital due to worsening respiratory symptoms. He reported a significant increase in shortness of breath with report of having a deep cough over the past three days.  The patient had been experiencing these symptoms for over the last week, but not to this extent.  The patient had been on a course of prednisone  and Augmentin, prescribed approximately 9-10 days ago by the [facility]. Despite this treatment, his symptoms have progressively worsened. The patient was diagnosed with COVID-19 two days ago, although he had been symptomatic for a longer period.  The patient has a past history of smoking but quit approximately 12 years ago. He does not typically require oxygen  therapy. The patient's worsening respiratory symptoms, in the context of his recent COVID-19 diagnosis, necessitated hospital admission for further management and monitoring.   In the emergency department patient was noted to have temperature of 99.9 F with respirations 20-25, O2 saturations as low as 88% room air with an on 2 L of oxygen .  Labs significant for WBC 13, BNP 365.8, high-sensitivity troponin 353->659.  Chest x-ray noted mild bibasilar atelectasis no acute cardiopulmonary disease.  COVID-19 screening was positive.  Patient had been given Zofran .  Review of Systems: As mentioned in the history of present illness. All other systems reviewed and are negative. Past Medical History:  Diagnosis Date   Allergy    seasonal   Anxiety    Arthritis    BPH (benign prostatic hyperplasia)    COPD  (chronic obstructive pulmonary disease) (HCC)    Depression    GERD (gastroesophageal reflux disease)    Glaucoma    Hypertension    Sleep apnea    Past Surgical History:  Procedure Laterality Date   APPENDECTOMY     COLONOSCOPY     12 yrs ago at least   EXTRACORPOREAL SHOCK WAVE LITHOTRIPSY Right 11/06/2021   Procedure: RIGHT EXTRACORPOREAL SHOCK WAVE LITHOTRIPSY (ESWL);  Surgeon: Watt Rush, MD;  Location: [facility];  Service: Urology;  Laterality: Right;   HERNIA REPAIR     bilat ing    KNEE ARTHROSCOPY     rt and lt   LEFT HEART CATH AND CORONARY ANGIOGRAPHY N/A 08/05/2017   Procedure: LEFT HEART CATH AND CORONARY ANGIOGRAPHY;  Surgeon: Court Dorn PARAS, MD;  Location: [facility];  Service: Cardiovascular;  Laterality: N/A;   SHOULDER SURGERY Right    x 2   Social History:  reports that he quit smoking about 13 years ago. His smoking use included cigarettes. He started smoking about 43 years ago. He has a 30 pack-year smoking history. He has never used smokeless tobacco. He reports that he does not drink alcohol and does not use drugs.  Allergies  Allergen Reactions   Grass Extracts [Gramineae Pollens]     Other reaction(s): Other (See Comments) Sneezing   Mirtazapine Rash   Penicillins Palpitations    Family History  Problem Relation Age of Onset   Emphysema Father        smoked   Rheum arthritis Mother  Colitis Mother    Colon polyps Mother 55   Heart disease Sister    Heart disease Brother    Liver disease Brother    Colon cancer Neg Hx    Esophageal cancer Neg Hx    Pancreatic cancer Neg Hx    Stomach cancer Neg Hx    Rectal cancer Neg Hx     Prior to Admission medications   Medication Sig Start Date End Date Taking? Authorizing Provider  albuterol  (PROVENTIL  HFA;VENTOLIN  HFA) 108 (90 Base) MCG/ACT inhaler Inhale 2 puffs into the lungs every 6 (six) hours as needed for wheezing or shortness of breath.    Yes [provider]  amoxicillin-clavulanate (AUGMENTIN) 875-125 MG tablet Take 1 tablet by mouth 2 (two) times daily. 06/15/23  Yes [provider]  apixaban  (ELIQUIS ) 5 MG TABS tablet Take 5 mg by mouth 2 (two) times daily. 08/06/22  Yes [provider]  augmented betamethasone dipropionate (DIPROLENE-AF) 0.05 % cream Apply 1 Application topically in the morning and at bedtime. 08/15/20  Yes [provider]  benzonatate  (TESSALON ) 100 MG capsule Take 100 mg by mouth 3 (three) times daily as needed for cough. 06/15/23  Yes [provider]  cyanocobalamin 2000 MCG tablet Take 2,500 mcg by mouth as needed. 2,571mcg   Yes [provider]  escitalopram  (LEXAPRO ) 10 MG tablet Take 10 mg by mouth daily.   Yes [provider]  finasteride  (PROSCAR ) 5 MG tablet Take 5 mg by mouth every evening.   Yes [provider]  guaiFENesin -Codeine  100-6.33 MG/5ML SOLN Take 5 mLs by mouth every 6 (six) hours as needed (cough). 06/15/23  Yes [provider]  Multiple Vitamins-Minerals (PRESERVISION AREDS 2 PO) Take 1 capsule by mouth daily.   Yes [provider]  predniSONE  (DELTASONE ) 20 MG tablet Take 20 mg by mouth daily with breakfast. 06/15/23  Yes [provider]  Tamsulosin  HCl (FLOMAX ) 0.4 MG CAPS Take 0.4 mg by mouth at bedtime.   Yes [provider]  Tiotropium Bromide-Olodaterol (STIOLTO RESPIMAT ) 2.5-2.5 MCG/ACT AERS 2 each am Patient taking differently: Inhale 2 puffs into the lungs daily. 06/13/23  Yes Darlean Ozell NOVAK, MD  traZODone  (DESYREL ) 50 MG tablet Take 50 mg by mouth at bedtime.   Yes [provider]  metoprolol  tartrate (LOPRESSOR ) 25 MG tablet Take 1 tablet by mouth every 8 hours as needed for afib HR over 100 as long as top BP over 100 Patient not taking: Reported on 06/18/2023 09/24/22   Terra Fairy PARAS, PA-C    Physical Exam: Vitals:   06/18/23 0211 06/18/23 0433 06/18/23 0650 06/18/23 0828  BP:  119/63  126/86   Pulse:  81 78   Resp:  20 (!) 25   Temp:  98.4 F (36.9 C) 98.8 F (37.1 C)   TempSrc:   Oral   SpO2:  94% 100%   Weight: 75 kg   75 kg  Height:    5' 10.5 (1.791 m)      Constitutional: Elderly male who appears to be ill but in no acute distress Eyes: PERRL, lids and conjunctivae normal ENMT: Mucous membranes are moist.  Fair dentition Neck: normal, supple, no masses, no thyromegaly Respiratory: Decreased overall aeration with expiratory wheezes appreciated.  Normal respiratory effort. No accessory muscle use.  Cardiovascular: Cardiac.  No extremity edema. 2+ pedal pulses.   Abdomen: no tenderness, no masses palpated.  Bowel sounds positive.  Musculoskeletal: no clubbing / cyanosis. No joint deformity upper and  lower extremities. Good ROM, no contractures. Normal muscle tone.  Skin: no rashes, lesions, ulcers. No induration Neurologic: CN 2-12 grossly intact.  Strength 5/5 in all 4.  Psychiatric: Normal judgment and insight. Alert and oriented x 3. Normal mood.   Data Reviewed:  EKG revealed normal sinus rhythm at 89 bpm with left atrial enlargement, incomplete right bundle branch block, and left anterior fascicular block.  Assessment and Plan:  Respiratory failure with hypoxia COVID-19 infection COPD exacerbation Acute.  Patient presents with complaints of progressively worsening shortness of breath over the last 3 days.  O2 saturations noted to be in low as 88% on room air for which she placed on 2 L nasal cannula oxygen  with O2 saturations maintained. On physical exam patient noted to have expiratory wheezes.   Chest x-ray noted mild bibasilar atelectasis without acute cardiopulmonary disease.  Patient reports having cough and shortness of breath over a week and have been treated in the outpatient setting with Augmentin, but tested positive for COVID-19 with an at home test 2 days ago.   He has been started on Solu-Medrol  IV. -Admit to a telemetry bed -COVID-19 order  set utilized -Continuous pulse oximetry with oxygen  maintain O2 saturation greater than 92% -Incentive spirometry and flutter valve -Check procalcitonin -Solu-Medrol  IV -Rocephin  IV -No utility in remdesivir  -Antitussives -Continue supportive care  Elevated troponin Acute.  High-sensitivity troponin 353->659.  Patient denies having any complaints of chest pain.  Patient with prior cardiac catheterization back in 08/2017 which noted normal LV systolic function with normal coronaries.  Question if secondary to acute respiratory distress in the setting of COVID-19 infection with respiratory distress. -Follow troponins -Heparin  per pharmacy -Check echocardiogram(echocardiogram noted to be 20-25%) -Cardiology consulted,  will follow-up for any further recommendations  Heart failure with preserved EF On physical exam patient does not appear grossly fluid overloaded. BNP was elevated at 365.8.  Last echocardiogram noted EF to be 50 to 55% with grade 1 diastolic dysfunction back in 06/2017. -Strict I&Os and daily weights -Follow-up echocardiogram(  Paroxysmal atrial fibrillation on chronic anticoagulation Patient appears to intermittently be in atrial fibrillation with heart rates elevated into the 140s.  Home medication regimen includes metoprolol  25 mg twice daily. -Continue metoprolol  -Holding Eliquis   Essential hypertension Blood pressures currently maintained. -Continue metoprolol  25 mg p.o. twice daily with holding parameters.  Anxiety -Continue Lexapro  continue trazodone  at night  BPH -Continue finasteride  and tamsulosin    GI prophylaxis: Pepcid  IV DVT prophylaxis: Eliquis  Advance Care Planning:   Code Status: Full Code   Consults: None  Family Communication: none  Severity of Illness: The appropriate patient status for this patient is INPATIENT. Inpatient status is judged to be reasonable and necessary in order to provide the required intensity of service to ensure the  patient's safety. The patient's presenting symptoms, physical exam findings, and initial radiographic and laboratory data in the context of their chronic comorbidities is felt to place them at high risk for further clinical deterioration. Furthermore, it is not anticipated that the patient will be medically stable for discharge from the hospital within 2 midnights of admission.   * I certify that at the point of admission it is my clinical judgment that the patient will require inpatient hospital care spanning beyond 2 midnights from the point of admission due to high intensity of service, high risk for further deterioration and high frequency of surveillance required.*  Author: Maximino DELENA Sharps, MD 06/18/2023 9:49 AM  For on call review [email or web address].

## 2023-06-18 NOTE — ED Notes (Signed)
 ED TO INPATIENT HANDOFF REPORT  ED Nurse Name and Phone #: Lorenza [phone]  S Name/Age/Gender Robert Adams 78 y.o. male Room/Bed: 010C/010C  Code Status   Code Status: Full Code  Home/SNF/Other Home Patient oriented to: self, place, time, and situation Is this baseline? Yes   Triage Complete: Triage complete  Chief Complaint Acute respiratory failure with hypoxia (HCC) [J96.01] COVID-19 [U07.1]  Triage Note Patient BIB [facility] c/o shortness of breath x 2 weeks, progressively getting worse.  Patient tested positive for covid using a home test on Saturday.  Patient placed on 2 L [place] by ems.    Allergies Allergies  Allergen Reactions   Grass Extracts [Gramineae Pollens]     Other reaction(s): Other (See Comments) Sneezing   Mirtazapine Rash   Penicillins Palpitations    Level of Care/Admitting Diagnosis ED Disposition     ED Disposition  Admit   Condition  --   Comment  Hospital Area: [facility] [[id number]]  Level of Care: Telemetry Medical [104]  May admit patient to [facility] or [facility] if equivalent level of care is available:: No  Covid Evaluation: Confirmed COVID Positive  Diagnosis: COVID-19 [[id number]]  Admitting Physician: CLAUDENE MAXIMINO LABOR [[id number]]  Attending Physician: CLAUDENE MAXIMINO LABOR [[id number]]  Certification:: I certify this patient will need inpatient services for at least 2 midnights          B Medical/Surgery History Past Medical History:  Diagnosis Date   Allergy    seasonal   Anxiety    Arthritis    BPH (benign prostatic hyperplasia)    COPD (chronic obstructive pulmonary disease) (HCC)    Depression    GERD (gastroesophageal reflux disease)    Glaucoma    Hypertension    Sleep apnea    Past Surgical History:  Procedure Laterality Date   APPENDECTOMY     COLONOSCOPY     12 yrs ago at least   EXTRACORPOREAL SHOCK WAVE LITHOTRIPSY Right 11/06/2021   Procedure: RIGHT EXTRACORPOREAL SHOCK WAVE  LITHOTRIPSY (ESWL);  Surgeon: Watt Rush, MD;  [facility];  Service: Urology;  Laterality: Right;   HERNIA REPAIR     bilat ing    KNEE ARTHROSCOPY     rt and lt   LEFT HEART CATH AND CORONARY ANGIOGRAPHY N/A 08/05/2017   Procedure: LEFT HEART CATH AND CORONARY ANGIOGRAPHY;  Surgeon: Court Dorn PARAS, MD;  Location: [facility];  Service: Cardiovascular;  Laterality: N/A;   SHOULDER SURGERY Right    x 2     A IV Location/Drains/Wounds Patient Lines/Drains/Airways Status     Active Line/Drains/Airways     Name Placement date Placement time Site Days   Peripheral IV 06/18/23 20 G Right Hand 06/18/23  0655  Hand  less than 1            Intake/Output Last 24 hours  Intake/Output Summary (Last 24 hours) at 06/18/2023 2218 Last data filed at 06/18/2023 1656 Gross per 24 hour  Intake 146.1 ml  Output --  Net 146.1 ml    Labs/Imaging Results for orders placed or performed during the hospital encounter of 06/18/23 (from the past 48 hours)  Basic metabolic panel     Status: Abnormal   Collection Time: 06/18/23  3:05 AM  Result Value Ref Range   Sodium 135 135 - 145 mmol/L   Potassium 4.2 3.5 - 5.1 mmol/L   Chloride 104 98 - 111 mmol/L   CO2 19 (L)  22 - 32 mmol/L   Glucose, Bld 97 70 - 99 mg/dL    Comment: Glucose reference range applies only to samples taken after fasting for at least 8 hours.   BUN 15 8 - 23 mg/dL   Creatinine, Ser 9.12 0.61 - 1.24 mg/dL   Calcium  8.9 8.9 - 10.3 mg/dL   GFR, Estimated >39 >39 mL/min    Comment: (NOTE) Calculated using the CKD-EPI Creatinine Equation (2021)    Anion gap 12 5 - 15    Comment: Performed at [facility], [address], [place], [place] [address]  CBC with Differential     Status: Abnormal   Collection Time: 06/18/23  3:05 AM  Result Value Ref Range   WBC 13.0 (H) 4.0 - 10.5 K/uL   RBC 4.93 4.22 - 5.81 MIL/uL   Hemoglobin 14.6 13.0 - 17.0 g/dL   HCT 56.3 60.9 - 47.9 %   MCV 88.4  80.0 - 100.0 fL   MCH 29.6 26.0 - 34.0 pg   MCHC 33.5 30.0 - 36.0 g/dL   RDW 86.1 88.4 - 84.4 %   Platelets 313 150 - 400 K/uL   nRBC 0.0 0.0 - 0.2 %   Neutrophils Relative % 81 %   Neutro Abs 10.6 (H) 1.7 - 7.7 K/uL   Lymphocytes Relative 5 %   Lymphs Abs 0.6 (L) 0.7 - 4.0 K/uL   Monocytes Relative 13 %   Monocytes Absolute 1.7 (H) 0.1 - 1.0 K/uL   Eosinophils Relative 0 %   Eosinophils Absolute 0.0 0.0 - 0.5 K/uL   Basophils Relative 0 %   Basophils Absolute 0.0 0.0 - 0.1 K/uL   Immature Granulocytes 1 %   Abs Immature Granulocytes 0.10 (H) 0.00 - 0.07 K/uL    Comment: Performed at [facility], [address] [place], [place] [address]  Brain natriuretic peptide     Status: Abnormal   Collection Time: 06/18/23  3:05 AM  Result Value Ref Range   B Natriuretic Peptide 365.8 (H) 0.0 - 100.0 pg/mL    Comment: Performed at [facility], [address], [place], [place] [address]  Resp panel by RT-PCR (RSV, Flu A&B, Covid) Anterior Nasal Swab     Status: Abnormal   Collection Time: 06/18/23  3:05 AM   Specimen: Anterior Nasal Swab  Result Value Ref Range   SARS Coronavirus 2 by RT PCR POSITIVE (A) NEGATIVE   Influenza A by PCR NEGATIVE NEGATIVE   Influenza B by PCR NEGATIVE NEGATIVE    Comment: (NOTE) The Xpert Xpress SARS-CoV-2/FLU/RSV plus assay is intended as an aid in the diagnosis of influenza from Nasopharyngeal swab specimens and should not be used as a sole basis for treatment. Nasal washings and aspirates are unacceptable for Xpert Xpress SARS-CoV-2/FLU/RSV testing.  Fact Sheet for Patients: bloggercourse.com  Fact Sheet for Healthcare Providers: seriousbroker.it  This test is not yet approved or cleared by the [place]  FDA and has been authorized for detection and/or diagnosis of SARS-CoV-2 by FDA under an Emergency Use Authorization (EUA). This EUA will remain in effect (meaning this test can be  used) for the duration of the COVID-19 declaration under Section 564(b)(1) of the Act, 21 U.S.C. section 360bbb-3(b)(1), unless the authorization is terminated or revoked.     Resp Syncytial Virus by PCR NEGATIVE NEGATIVE    Comment: (NOTE) Fact Sheet for Patients: bloggercourse.com  Fact Sheet for Healthcare Providers: seriousbroker.it  This test is not yet approved or  cleared by the [place]  FDA and has been authorized for detection and/or diagnosis of SARS-CoV-2 by FDA under an Emergency Use Authorization (EUA). This EUA will remain in effect (meaning this test can be used) for the duration of the COVID-19 declaration under Section 564(b)(1) of the Act, 21 U.S.C. section 360bbb-3(b)(1), unless the authorization is terminated or revoked.  Performed at [facility], [address] [place], [place] [address]   Troponin I (High Sensitivity)     Status: Abnormal   Collection Time: 06/18/23  3:05 AM  Result Value Ref Range   Troponin I (High Sensitivity) 353 (HH) <18 ng/L    Comment: CRITICAL RESULT CALLED TO, READ BACK BY AND VERIFIED WITH TERRILEE DASEN, RN [phone], 06/18/2023 OUROBANGNAGNA S (NOTE) Elevated high sensitivity troponin I (hsTnI) values and significant  changes across serial measurements may suggest ACS but many other  chronic and acute conditions are known to elevate hsTnI results.  Refer to the Links section for chest pain algorithms and additional  guidance. Performed at [facility], [address], [place], [place] [address]   Troponin I (High Sensitivity)     Status: Abnormal   Collection Time: 06/18/23  6:37 AM  Result Value Ref Range   Troponin I (High Sensitivity) 659 (HH) <18 ng/L    Comment: CRITICAL VALUE NOTED. VALUE IS CONSISTENT WITH PREVIOUSLY REPORTED/CALLED VALUE (NOTE) Elevated high sensitivity troponin I (hsTnI) values and significant  changes across serial measurements may suggest  ACS but many other  chronic and acute conditions are known to elevate hsTnI results.  Refer to the Links section for chest pain algorithms and additional  guidance. Performed at [facility], [address] [place], [place] [address]   Procalcitonin     Status: None   Collection Time: 06/18/23  6:37 AM  Result Value Ref Range   Procalcitonin 0.15 ng/mL    Comment:        Interpretation: PCT (Procalcitonin) <= 0.5 ng/mL: Systemic infection (sepsis) is not likely. Local bacterial infection is possible. (NOTE)       Sepsis PCT Algorithm           Lower Respiratory Tract                                      Infection PCT Algorithm    ----------------------------     ----------------------------         PCT < 0.25 ng/mL                PCT < 0.10 ng/mL          Strongly encourage             Strongly discourage   discontinuation of antibiotics    initiation of antibiotics    ----------------------------     -----------------------------       PCT 0.25 - 0.50 ng/mL            PCT 0.10 - 0.25 ng/mL               OR       >80% decrease in PCT            Discourage initiation of  antibiotics      Encourage discontinuation           of antibiotics    ----------------------------     -----------------------------         PCT >= 0.50 ng/mL              PCT 0.26 - 0.50 ng/mL               AND        <80% decrease in PCT             Encourage initiation of                                             antibiotics       Encourage continuation           of antibiotics    ----------------------------     -----------------------------        PCT >= 0.50 ng/mL                  PCT > 0.50 ng/mL               AND         increase in PCT                  Strongly encourage                                      initiation of antibiotics    Strongly encourage escalation           of antibiotics                                      -----------------------------                                           PCT <= 0.25 ng/mL                                                 OR                                        > 80% decrease in PCT                                      Discontinue / Do not initiate                                             antibiotics  Performed at [facility], [address], [place], [place] [address]    ECHOCARDIOGRAM COMPLETE Result Date: 06/18/2023    ECHOCARDIOGRAM REPORT  Patient Name:   SAMIT SYLVE Date of Exam: 06/18/2023 Medical Rec #:  [id number]      Height:       70.5 in Accession #:    [id number]     Weight:       165.3 lb Date of Birth:  Dec 08, 1945       BSA:          1.935 m Patient Age:    78 years       BP:           111/74 mmHg Patient Gender: M              HR:           110 bpm. Exam Location:  Inpatient Procedure: 2D Echo, Cardiac Doppler and Color Doppler Indications:    Elevated troponins  History:        Patient has prior history of Echocardiogram examinations, most                 recent 06/24/2017. COPD, Arrythmias:Atrial Fibrillation; Risk                 Factors:Hypertension.  Sonographer:    Amy Chionchio Referring Phys: CLAUDENE REEVES, A IMPRESSIONS  1. Left ventricular ejection fraction, by estimation, is 20 to 25%. The left ventricle has severely decreased function. The left ventricle demonstrates global hypokinesis. There is mild concentric left ventricular hypertrophy. Left ventricular diastolic  parameters are indeterminate.  2. Right ventricular systolic function is mildly reduced. The right ventricular size is normal.  3. The mitral valve is grossly normal. No evidence of mitral valve regurgitation. No evidence of mitral stenosis.  4. The aortic valve was not well visualized. Aortic valve regurgitation is not visualized. No aortic stenosis is present.  5. The inferior vena cava is dilated in size with <50% respiratory variability, suggesting right atrial pressure of 15  mmHg. Comparison(s): LVEF is markedly worse from prior reporting. Reaching out to primary team. FINDINGS  Left Ventricle: Left ventricular ejection fraction, by estimation, is 20 to 25%. The left ventricle has severely decreased function. The left ventricle demonstrates global hypokinesis. The left ventricular internal cavity size was normal in size. There is mild concentric left ventricular hypertrophy. Left ventricular diastolic parameters are indeterminate. Right Ventricle: The right ventricular size is normal. No increase in right ventricular wall thickness. Right ventricular systolic function is mildly reduced. Left Atrium: Left atrial size was normal in size. Right Atrium: Right atrial size was normal in size. Pericardium: There is no evidence of pericardial effusion. Mitral Valve: The mitral valve is grossly normal. No evidence of mitral valve regurgitation. No evidence of mitral valve stenosis. MV peak gradient, 3.6 mmHg. The mean mitral valve gradient is 2.0 mmHg. Tricuspid Valve: The tricuspid valve is normal in structure. Tricuspid valve regurgitation is not demonstrated. No evidence of tricuspid stenosis. Aortic Valve: The aortic valve was not well visualized. Aortic valve regurgitation is not visualized. No aortic stenosis is present. Aortic valve mean gradient measures 2.0 mmHg. Aortic valve peak gradient measures 3.5 mmHg. Aortic valve area, by VTI measures 2.78 cm. Pulmonic Valve: The pulmonic valve was not well visualized. Pulmonic valve regurgitation is not visualized. No evidence of pulmonic stenosis. Aorta: The aortic root and ascending aorta are structurally normal, with no evidence of dilitation. Venous: The inferior vena cava is dilated in size with less than 50% respiratory variability, suggesting right atrial pressure of 15 mmHg. IAS/Shunts: The interatrial septum was not well visualized.  LEFT VENTRICLE PLAX 2D LVIDd:         4.60 cm LVIDs:         4.30 cm LV PW:         1.10 cm LV IVS:         0.90 cm LVOT diam:     2.10 cm LV SV:         35 LV SV Index:   18 LVOT Area:     3.46 cm  RIGHT VENTRICLE          IVC RV Basal diam:  3.30 cm  IVC diam: 2.20 cm TAPSE (M-mode): 1.1 cm RIGHT ATRIUM           Index RA Area:     18.00 cm RA Volume:   50.50 ml  26.09 ml/m  AORTIC VALVE AV Area (Vmax):    2.81 cm AV Area (Vmean):   2.69 cm AV Area (VTI):     2.78 cm AV Vmax:           93.40 cm/s AV Vmean:          64.200 cm/s AV VTI:            0.128 m AV Peak Grad:      3.5 mmHg AV Mean Grad:      2.0 mmHg LVOT Vmax:         75.83 cm/s LVOT Vmean:        49.867 cm/s LVOT VTI:          0.102 m LVOT/AV VTI ratio: 0.80  AORTA Ao Root diam: 3.10 cm Ao Asc diam:  3.30 cm MITRAL VALVE             TRICUSPID VALVE MV Area VTI:  2.19 cm   TR Peak grad:   17.5 mmHg MV Peak grad: 3.6 mmHg   TR Vmax:        209.00 cm/s MV Mean grad: 2.0 mmHg MV Vmax:      0.95 m/s   SHUNTS MV Vmean:     57.9 cm/s  Systemic VTI:  0.10 m                          Systemic Diam: 2.10 cm Stanly Leavens MD Electronically signed by Stanly Leavens MD Signature Date/Time: 06/18/2023/4:45:57 PM    Final    DG Chest 2 View Result Date: 06/18/2023 CLINICAL DATA:  Shortness of breath, chest congestion EXAM: CHEST - 2 VIEW COMPARISON:  09/12/2022 FINDINGS: Mild bibasilar atelectasis. No focal consolidation. No pleural effusion or pneumothorax. Heart is normal in size.  Thoracic aortic atherosclerosis. IMPRESSION: Mild bibasilar atelectasis. No acute cardiopulmonary disease. Electronically Signed   By: Pinkie Pebbles M.D.   On: 06/18/2023 03:33    Pending Labs Unresulted Labs (From admission, onward)     Start     Ordered   06/20/23 0500  Heparin  level (unfractionated)  Daily,   R      06/18/23 1711   06/20/23 0500  CBC  Daily,   R      06/18/23 1711   06/20/23 0500  APTT  Daily,   R      06/18/23 1713   06/19/23 0800  Heparin  level (unfractionated)  Once-Timed,   TIMED        06/18/23 1711   06/19/23 0800  APTT  Once,    R        06/18/23 1711   06/19/23  0500  CBC  Tomorrow morning,   R        06/18/23 1008   06/19/23 0500  Basic metabolic panel  Tomorrow morning,   R        06/18/23 1008   06/18/23 1824  Hepatic function panel  Once,   R        06/18/23 1824   06/18/23 1003  Urinalysis, Routine w reflex microscopic -Urine, Clean Catch  Once,   R       Question:  Specimen Source  Answer:  Urine, Clean Catch   06/18/23 1003            Vitals/Pain Today's Vitals   06/18/23 1600 06/18/23 1615 06/18/23 1826 06/18/23 2136  BP: 118/79 130/80  118/81  Pulse: (!) 148 (!) 148  (!) 59  Resp: 20 (!) 30  (!) 25  Temp:   98.6 F (37 C) 97.6 F (36.4 C)  TempSrc:   Oral Oral  SpO2: 96% 95%  96%  Weight:      Height:      PainSc:        Isolation Precautions Airborne and Contact precautions  Medications Medications  escitalopram  (LEXAPRO ) tablet 10 mg (10 mg Oral Given 06/18/23 1244)  traZODone  (DESYREL ) tablet 50 mg (has no administration in time range)  finasteride  (PROSCAR ) tablet 5 mg (5 mg Oral Given 06/18/23 2029)  tamsulosin  (FLOMAX ) capsule 0.4 mg (has no administration in time range)  guaiFENesin -codeine  100-10 MG/5ML solution 5 mL (has no administration in time range)  arformoterol  (BROVANA ) nebulizer solution 15 mcg (15 mcg Nebulization Given 06/18/23 2053)    And  umeclidinium bromide  (INCRUSE ELLIPTA ) 62.5 MCG/ACT 1 puff (1 puff Inhalation Not Given 06/18/23 1625)  sodium chloride  flush (NS) 0.9 % injection 3 mL (3 mLs Intravenous Given 06/18/23 1249)  acetaminophen  (TYLENOL ) tablet 650 mg (has no administration in time range)    Or  acetaminophen  (TYLENOL ) suppository 650 mg (has no administration in time range)  ondansetron  (ZOFRAN ) tablet 4 mg (has no administration in time range)    Or  ondansetron  (ZOFRAN ) injection 4 mg (has no administration in time range)  metoprolol  tartrate (LOPRESSOR ) injection 2.5 mg (has no administration in time range)  methylPREDNISolone  sodium  succinate (SOLU-MEDROL ) 125 mg/2 mL injection 75 mg (75 mg Intravenous Given 06/18/23 1620)    Followed by  predniSONE  (DELTASONE ) tablet 50 mg (has no administration in time range)  famotidine  (PEPCID ) IVPB 20 mg premix (0 mg Intravenous Stopped 06/18/23 1653)  cefTRIAXone  (ROCEPHIN ) 2 g in sodium chloride  0.9 % 100 mL IVPB (0 g Intravenous Stopped 06/18/23 1656)  metoprolol  tartrate (LOPRESSOR ) tablet 25 mg (25 mg Oral Given 06/18/23 1619)  heparin  ADULT infusion 100 units/mL (25000 units/250mL) (has no administration in time range)  ondansetron  (ZOFRAN -ODT) disintegrating tablet 4 mg (4 mg Oral Given 06/18/23 0830)  metoprolol  tartrate (LOPRESSOR ) injection 2.5 mg (2.5 mg Intravenous Given 06/18/23 1244)  furosemide  (LASIX ) injection 20 mg (20 mg Intravenous Given 06/18/23 2028)    Mobility walks     Focused Assessments Cardiac Assessment Handoff:  Cardiac Rhythm: Sinus tachycardia No results found for: CKTOTAL, CKMB, CKMBINDEX, TROPONINI No results found for: DDIMER Does the Patient currently have chest pain? No    R Recommendations: See Admitting Provider Note  Report given to:   Additional Notes:

## 2023-06-18 NOTE — ED Notes (Signed)
 Pt refused to change into gown and stated he wanted to keep his robe on

## 2023-06-18 NOTE — ED Triage Notes (Signed)
 Patient BIB [facility] c/o shortness of breath x 2 weeks, progressively getting worse.  Patient tested positive for covid using a home test on Saturday.  Patient placed on 2 L [place] by ems.

## 2023-06-18 NOTE — ED Notes (Signed)
 I placed pt in hospital bed. Pt got tachycardic from moving around up to 147

## 2023-06-18 NOTE — ED Provider Notes (Signed)
 [name] Provider Note   CSN: [id number] Arrival date & time: 06/18/23  0158     History  Chief Complaint  Patient presents with   Shortness of Breath    Robert Adams is a 78 y.o. Adams.   Shortness of Breath Associated symptoms: cough      78 year old Adams presenting to the emergency department with respiratory failure in the setting of COVID-19.  The patient states that he tested positive for COVID-19 at home home via home test on Saturday.  He states that his symptoms have been progressive since this past Friday with cough and shortness of breath.  He denies any chest pain.  He endorses some generalized abdominal discomfort with associated nausea and vomiting.  He is passing gas and moving his bowels.  He was seen at the [facility] outpatient and prescribed a prednisone  taper and has been on Augmentin.  Home Medications Prior to Admission medications   Medication Sig Start Date End Date Taking? Authorizing Provider  amoxicillin-clavulanate (AUGMENTIN) 875-125 MG tablet Take 1 tablet by mouth 2 (two) times daily. 06/15/23  Yes [provider]  benzonatate  (TESSALON ) 100 MG capsule Take 100 mg by mouth 3 (three) times daily as needed for cough. 06/15/23  Yes [provider]  guaiFENesin -Codeine  100-6.33 MG/5ML SOLN Take 5 mLs by mouth every 6 (six) hours as needed (cough). 06/15/23  Yes [provider]  predniSONE  (DELTASONE ) 20 MG tablet Take 20 mg by mouth daily with breakfast. 06/15/23  Yes [provider]  albuterol  (PROVENTIL  HFA;VENTOLIN  HFA) 108 (90 Base) MCG/ACT inhaler Inhale 2 puffs into the lungs every 6 (six) hours as needed for wheezing or shortness of breath.     [provider]  apixaban  (ELIQUIS ) 5 MG TABS tablet TAKE ONE TABLET BY MOUTH TWICE A DAY (CAUTION: BLOOD THINNER) 08/06/22   [provider]  augmented betamethasone dipropionate (DIPROLENE-AF) 0.05 % cream APPLY SMALL  AMOUNT TO AFFECTED AREA TWICE A DAY 08/15/20   [provider]  cyanocobalamin 2000 MCG tablet Take 2,500 mcg by mouth as needed. 2,534mcg    [provider]  escitalopram  (LEXAPRO ) 10 MG tablet Take 10 mg by mouth daily.    [provider]  finasteride  (PROSCAR ) 5 MG tablet Take 5 mg by mouth every evening.    [provider]  metoprolol  tartrate (LOPRESSOR ) 25 MG tablet Take 1 tablet by mouth every 8 hours as needed for afib HR over 100 as long as top BP over 100 09/24/22   Terra Fairy PARAS, PA-C  Multiple Vitamins-Minerals (PRESERVISION AREDS 2 PO) Take 1 capsule by mouth daily.    [provider]  pantoprazole  (PROTONIX ) 40 MG tablet Take 1 tablet (40 mg total) by mouth daily. Take 30-60 min before first meal of the day Patient not taking: Reported on 06/13/2023 03/06/21   Wert, Michael B, MD  Tamsulosin  HCl (FLOMAX ) 0.4 MG CAPS Take 0.4 mg by mouth at bedtime.    [provider]  Tiotropium Bromide-Olodaterol (STIOLTO RESPIMAT ) 2.5-2.5 MCG/ACT AERS 2 each am 06/13/23   Wert, Michael B, MD  Tiotropium Bromide-Olodaterol (STIOLTO RESPIMAT ) 2.5-2.5 MCG/ACT AERS Inhale 2 puffs into the lungs daily. 06/13/23   Darlean Ozell NOVAK, MD  traZODone  (DESYREL ) 50 MG tablet Take 50 mg by mouth at bedtime.    [provider]      Allergies    Grass extracts [gramineae pollens], Mirtazapine, and Penicillins    Review of Systems  Review of Systems  Respiratory:  Positive for cough and shortness of breath.   All other systems reviewed and are negative.   Physical Exam Updated Vital Signs BP 119/63 (BP Location: Left Arm)   Pulse 81   Temp 98.4 F (36.9 C)   Resp 20   Wt 75 kg   SpO2 94%   BMI Robert.39 kg/m  Physical Exam Vitals and nursing note reviewed.  Constitutional:      General: He is not in acute distress.    Appearance: He is well-developed.  HENT:     Head: Normocephalic and atraumatic.  Eyes:     Conjunctiva/sclera: Conjunctivae  normal.  Cardiovascular:     Rate and Rhythm: Normal rate and regular rhythm.     Heart sounds: No murmur heard. Pulmonary:     Effort: Pulmonary effort is normal. No respiratory distress.     Breath sounds: Normal breath sounds.     Comments: On O2 via nasal cannula, resting comfortably in NAD Abdominal:     Palpations: Abdomen is soft.     Tenderness: There is no abdominal tenderness.  Musculoskeletal:        General: No swelling.     Cervical back: Neck supple.  Skin:    General: Skin is warm and dry.     Capillary Refill: Capillary refill takes less than 2 seconds.  Neurological:     Mental Status: He is alert.  Psychiatric:        Mood and Affect: Mood normal.     ED Results / Procedures / Treatments   Labs (all labs ordered are listed, but only abnormal results are displayed) Labs Reviewed  RESP PANEL BY RT-PCR (RSV, FLU A&B, COVID)  RVPGX2 - Abnormal; Notable for the following components:      Result Value   SARS Coronavirus 2 by RT PCR POSITIVE (*)    All other components within normal limits  BASIC METABOLIC PANEL - Abnormal; Notable for the following components:   CO2 19 (*)    All other components within normal limits  CBC WITH DIFFERENTIAL/PLATELET - Abnormal; Notable for the following components:   WBC 13.0 (*)    Neutro Abs 10.6 (*)    Lymphs Abs 0.6 (*)    Monocytes Absolute 1.7 (*)    Abs Immature Granulocytes 0.10 (*)    All other components within normal limits  BRAIN NATRIURETIC PEPTIDE - Abnormal; Notable for the following components:   B Natriuretic Peptide 365.8 (*)    All other components within normal limits  TROPONIN I (HIGH SENSITIVITY)    EKG EKG Interpretation Date/Time:  Tuesday June 18 2023 02:02:56 EST Ventricular Rate:  89 PR Interval:  136 QRS Duration:  110 QT Interval:  346 QTC Calculation: 420 R Axis:   -46  Text Interpretation: Normal sinus rhythm Possible Left atrial enlargement Incomplete right bundle branch block  Left anterior fascicular block Minimal voltage criteria for LVH, may be normal variant ( Cornell product ) Septal infarct , age undetermined Abnormal ECG Confirmed by Jerrol Agent ([id number]) on 06/18/2023 4:45:25 AM  Radiology DG Chest 2 View Result Date: 06/18/2023 CLINICAL DATA:  Shortness of breath, chest congestion EXAM: CHEST - 2 VIEW COMPARISON:  09/12/2022 FINDINGS: Mild bibasilar atelectasis. No focal consolidation. No pleural effusion or pneumothorax. Heart is normal in size.  Thoracic aortic atherosclerosis. IMPRESSION: Mild bibasilar atelectasis. No acute cardiopulmonary disease. Electronically Signed   By: Pinkie Pebbles M.D.   On: 06/18/2023 03:33    Procedures  Procedures    Medications Ordered in ED Medications  ondansetron  (ZOFRAN -ODT) disintegrating tablet 4 mg (has no administration in time range)    ED Course/ Medical Decision Making/ A&P Clinical Course as of 06/18/23 0540  Tue Jun 18, 2023  0457 SARS Coronavirus 2 by RT PCR(!): POSITIVE [JL]    Clinical Course User Index [JL] Jerrol Agent, MD                                 Medical Decision Making Amount and/or Complexity of Data Reviewed Labs:  Decision-making details documented in ED Course.  Risk Prescription drug management. Decision regarding hospitalization.    78 year old Adams presenting to the emergency department with respiratory failure in the setting of COVID-19.  The patient states that he tested positive for COVID-19 at home home via home test on Saturday.  He states that his symptoms have been progressive since this past Friday with cough and shortness of breath.  He denies any chest pain.  He endorses some generalized abdominal discomfort with associated nausea and vomiting.  He is passing gas and moving his bowels.  He was seen at the [facility] outpatient and prescribed a prednisone  taper and has been on Augmentin.  On arrival, the patient was afebrile, temperature 99.9, heart rate 93, mildly  tachypneic RR 22, saturating 80% on room air, subsequently placed on 2 L O2 via nasal cannula, hemodynamically stable.  Patient presenting with acute hypoxic respiratory failure in the setting of positive home COVID test.  Consider developing bacterial pneumonia although patient has been on antibiotics outpatient.  Exam revealed an abdomen that was soft and nontender, lungs that were clear.   Laboratory evaluation revealed COVID-19 PCR testing positive, BMP unremarkable with mild acidosis to 19 with a normal anion gap, normal renal function, CBC with a leukocytosis to 13 however the patient has been on Prednisone  outpatient, BNP elevated at 366, cardiac troponins pending.  Patient administered oral Zofran  for mild nausea. Pt abdomen is soft and non-tender, suspect likely nausea and discomfort in the setting of his COVID infection, lower concern for an acute intraabdominal abnormality. A chest x-ray revealed  IMPRESSION:  Mild bibasilar atelectasis.    No acute cardiopulmonary disease.   In the setting of his acute toxic respiratory failure and COVID-19 infection, medicine was consulted for admission, pt and family updated on the plan of care, Dr. Charlton accepting.   Final Clinical Impression(s) / ED Diagnoses Final diagnoses:  Acute respiratory failure with hypoxia (HCC)  COVID-19  Nausea and vomiting, unspecified vomiting type    Rx / DC Orders ED Discharge Orders     None         Jerrol Agent, MD 06/18/23 [phone]

## 2023-06-18 NOTE — Consult Note (Signed)
 Cardiology Consultation   Patient ID: Robert Adams MRN: [id number]; DOB: 1945/09/01  Admit date: 06/18/2023 Date of Consult: 06/18/2023  PCP:  Robert Tanda FORBES Mickey., MD   [facility] Providers Cardiologist:  Robert Lesches, MD        Patient Profile:   Robert Adams is a 78 y.o. male with a hx of of COPD, HTN, history tobacco use, GERD, BPH, GAD, atrial flutter, and atrial fibrillation who presents for consultatio  who is being seen 06/18/2023 for the evaluation of new cardiomyopathy and elevated BNP at the request of hospital medicine.  History of Present Illness:   [name] is a very pleasant 78 year old man with a history of atrial fibrillation and atrial flutter with a CV 2 score 4 on Eliquis , history of tobacco abuse, hypertension, and previous cardiac evaluation for dyspnea in 2019 with coronary angiogram demonstrating normal coronary arteries who was admitted due to increasing dyspnea.  The patient has had increasing shortness of breath over the past few days.  Last week he was prescribed prednisone  and Augmentin due to a cough.  Despite this his shortness of breath progressed.  He was eventually diagnosed with COVID 2 days ago.  Earlier today the patient became quite short of breath and for this reason he proceeded to the emergency department.  Here his laboratories were remarkable for mildly elevated troponins up to 659, positive COVID swab, and elevated BNP of 359, and a chest x-ray showing no infiltrates, pleural effusions, or severe pulmonary edema.  His procalcitonin was only 0.15.  He was administered metoprolol , apixaban , methylprednisolone , famotidine , and ceftriaxone .  He feels somewhat better.  He had an echocardiogram which demonstrated new cardiomyopathy with ejection fraction of 20 to 25% with global hypokinesis.  Interestingly his initial EKG demonstrated normal sinus rhythm with incomplete right bundle branch block and left anterior fascicular block.  For  this reason cardiology was consulted.  An EKG at around 1230 demonstrated sinus tachycardia with an IVCD.  On telemetry the patient is currently in atrial fibrillation with a rate of around the 100 bpm.   Past Medical History:  Diagnosis Date   Allergy    seasonal   Anxiety    Arthritis    BPH (benign prostatic hyperplasia)    COPD (chronic obstructive pulmonary disease) (HCC)    Depression    GERD (gastroesophageal reflux disease)    Glaucoma    Hypertension    Sleep apnea     Past Surgical History:  Procedure Laterality Date   APPENDECTOMY     COLONOSCOPY     12 yrs ago at least   EXTRACORPOREAL SHOCK WAVE LITHOTRIPSY Right 11/06/2021   Procedure: RIGHT EXTRACORPOREAL SHOCK WAVE LITHOTRIPSY (ESWL);  Surgeon: Watt Rush, MD;  Location: [facility];  Service: Urology;  Laterality: Right;   HERNIA REPAIR     bilat ing    KNEE ARTHROSCOPY     rt and lt   LEFT HEART CATH AND CORONARY ANGIOGRAPHY N/A 08/05/2017   Procedure: LEFT HEART CATH AND CORONARY ANGIOGRAPHY;  Surgeon: Adams Robert PARAS, MD;  Location: [facility] IN[facility]AS[facility];  Service: Cardiovascular;  Laterality: N/A;   SHOULDER SURGERY Right    x 2       Inpatient Medications: Scheduled Meds:  arformoterol   15 mcg Nebulization BID   And   umeclidinium bromide   1 puff Inhalation Daily   escitalopram   10 mg Oral Daily   finasteride   5 mg Oral QPM   methylPREDNISolone  (  SOLU-MEDROL ) injection  1 mg/kg Intravenous Q12H   Followed by   NOREEN ON 06/21/2023] predniSONE   50 mg Oral Daily   metoprolol  tartrate  25 mg Oral BID   sodium chloride  flush  3 mL Intravenous Q12H   tamsulosin   0.4 mg Oral QHS   traZODone   50 mg Oral QHS   Continuous Infusions:  cefTRIAXone  (ROCEPHIN )  IV Stopped (06/18/23 1656)   famotidine  (PEPCID ) IV Stopped (06/18/23 1653)   [START ON 06/19/2023] heparin      PRN Meds: acetaminophen  **OR** acetaminophen , guaiFENesin -codeine , metoprolol  tartrate, ondansetron  **OR** ondansetron   (ZOFRAN ) IV  Allergies:    Allergies  Allergen Reactions   Grass Extracts [Gramineae Pollens]     Other reaction(s): Other (See Comments) Sneezing   Mirtazapine Rash   Penicillins Palpitations    Social History:   Social History   Socioeconomic History   Marital status: Married    Spouse name: Not on file   Number of children: Not on file   Years of education: Not on file   Highest education level: Not on file  Occupational History   Not on file  Tobacco Use   Smoking status: Former    Current packs/day: 0.00    Average packs/day: 1 pack/day for 30.0 years (30.0 ttl pk-yrs)    Types: Cigarettes    Start date: 06/04/1980    Quit date: 06/04/2010    Years since quitting: 13.0   Smokeless tobacco: Never  Vaping Use   Vaping status: Never Used  Substance and Sexual Activity   Alcohol use: No    Comment: rare   Drug use: No   Sexual activity: Not on file  Other Topics Concern   Not on file  Social History Narrative   Not on file   Social Drivers of Health   Financial Resource Strain: Not on file  Food Insecurity: Not on file  Transportation Needs: Not on file  Physical Activity: Not on file  Stress: Not on file  Social Connections: Unknown (10/14/2021)   Received from [facility], [facility]   Social Network    Social Network: Not on file  Intimate Partner Violence: Unknown (09/05/2021)   Received from [facility], [facility]   HITS    Physically Hurt: Not on file    Insult or Talk Down To: Not on file    Threaten Physical Harm: Not on file    Scream or Curse: Not on file    Family History:    Family History  Problem Relation Age of Onset   Emphysema Father        smoked   Rheum arthritis Mother    Colitis Mother    Colon polyps Mother 10   Heart disease Sister    Heart disease Brother    Liver disease Brother    Colon cancer Neg Hx    Esophageal cancer Neg Hx    Pancreatic cancer Neg Hx    Stomach cancer Neg Hx    Rectal cancer Neg Hx       ROS:  Please see the history of present illness.   All other ROS reviewed and negative.     Physical Exam/Data:   Vitals:   06/18/23 1300 06/18/23 1446 06/18/23 1600 06/18/23 1615  BP: 111/74  118/79 130/80  Pulse: (!) 108  (!) 148 (!) 148  Resp: 17  20 (!) 30  Temp:  98.7 F (37.1 C)    TempSrc:  Oral    SpO2: 99%  96% 95%  Weight:      Height:        Intake/Output Summary (Last 24 hours) at 06/18/2023 1759 Last data filed at 06/18/2023 1656 Gross per 24 hour  Intake 146.1 ml  Output --  Net 146.1 ml      06/18/2023    8:28 AM 06/18/2023    2:11 AM 06/13/2023    2:34 PM  Last 3 Weights  Weight (lbs) 165 lb 5.5 oz 165 lb 5.5 oz 165 lb 3.2 oz  Weight (kg) 75 kg 75 kg 74.934 kg     Body mass index is 23.39 kg/m.  General:  Well nourished, well developed, in no acute distress HEENT: normal Neck: JVP around 13 mmHg Vascular: No carotid bruits; Distal pulses 2+ bilaterally Cardiac: Irregular rate and rhythm Lungs: Coarse breath sounds bilaterally Abd: soft, nontender, no hepatomegaly  Ext: no edema Musculoskeletal:  No deformities, BUE and BLE strength normal and equal Skin: warm and dry  Neuro:  CNs 2-12 intact, no focal abnormalities noted Psych:  Normal affect   EKG:  The EKG was personally reviewed and demonstrates: Initially sinus rhythm with a left anterior fascicular block and incomplete right bundle branch block with subsequent EKG demonstrating sinus tachycardia versus SVT. Telemetry:  Telemetry was personally reviewed and demonstrates: Atrial fibrillation with a ventricular rate of around 100.  Relevant CV Studies:  TTE 06/18/23 1. Left ventricular ejection fraction, by estimation, is 20 to 25%. The  left ventricle has severely decreased function. The left ventricle  demonstrates global hypokinesis. There is mild concentric left ventricular  hypertrophy. Left ventricular diastolic   parameters are indeterminate.   2. Right ventricular systolic function  is mildly reduced. The right  ventricular size is normal.   3. The mitral valve is grossly normal. No evidence of mitral valve  regurgitation. No evidence of mitral stenosis.   4. The aortic valve was not well visualized. Aortic valve regurgitation  is not visualized. No aortic stenosis is present.   5. The inferior vena cava is dilated in size with <50% respiratory  variability, suggesting right atrial pressure of 15 mmHg.   Coronary angiography 2019 normal coronaries  Laboratory Data:  High Sensitivity Troponin:   Recent Labs  Lab 06/18/23 0305 06/18/23 0637  TROPONINIHS 353* 659*     Chemistry Recent Labs  Lab 06/18/23 0305  NA 135  K 4.2  CL 104  CO2 19*  GLUCOSE 97  BUN 15  CREATININE 0.87  CALCIUM  8.9  GFRNONAA >60  ANIONGAP 12    No results for input(s): PROT, ALBUMIN , AST, ALT, ALKPHOS, BILITOT in the last 168 hours. Lipids No results for input(s): CHOL, TRIG, HDL, LABVLDL, LDLCALC, CHOLHDL in the last 168 hours.  Hematology Recent Labs  Lab 06/18/23 0305  WBC 13.0*  RBC 4.93  HGB 14.6  HCT 43.6  MCV 88.4  MCH 29.6  MCHC 33.5  RDW 13.8  PLT 313   Thyroid  No results for input(s): TSH, FREET4 in the last 168 hours.  BNP Recent Labs  Lab 06/18/23 0305  BNP 365.8*    DDimer No results for input(s): DDIMER in the last 168 hours.   Radiology/Studies:  ECHOCARDIOGRAM COMPLETE Result Date: 06/18/2023    ECHOCARDIOGRAM REPORT   Patient Name:   Robert Adams Date of Exam: 06/18/2023 Medical Rec #:  [id number]      Height:       70.5 in Accession #:    [id number]     Weight:  165.3 lb Date of Birth:  04-Jul-1945       BSA:          1.935 m Patient Age:    78 years       BP:           111/74 mmHg Patient Gender: M              HR:           110 bpm. Exam Location:  Inpatient Procedure: 2D Echo, Cardiac Doppler and Color Doppler Indications:    Elevated troponins  History:        Patient has prior history of Echocardiogram  examinations, most                 recent 06/24/2017. COPD, Arrythmias:Atrial Fibrillation; Risk                 Factors:Hypertension.  Sonographer:    Amy Chionchio Referring Phys: CLAUDENE REEVES, A IMPRESSIONS  1. Left ventricular ejection fraction, by estimation, is 20 to 25%. The left ventricle has severely decreased function. The left ventricle demonstrates global hypokinesis. There is mild concentric left ventricular hypertrophy. Left ventricular diastolic  parameters are indeterminate.  2. Right ventricular systolic function is mildly reduced. The right ventricular size is normal.  3. The mitral valve is grossly normal. No evidence of mitral valve regurgitation. No evidence of mitral stenosis.  4. The aortic valve was not well visualized. Aortic valve regurgitation is not visualized. No aortic stenosis is present.  5. The inferior vena cava is dilated in size with <50% respiratory variability, suggesting right atrial pressure of 15 mmHg. Comparison(s): LVEF is markedly worse from prior reporting. Reaching out to primary team. FINDINGS  Left Ventricle: Left ventricular ejection fraction, by estimation, is 20 to 25%. The left ventricle has severely decreased function. The left ventricle demonstrates global hypokinesis. The left ventricular internal cavity size was normal in size. There is mild concentric left ventricular hypertrophy. Left ventricular diastolic parameters are indeterminate. Right Ventricle: The right ventricular size is normal. No increase in right ventricular wall thickness. Right ventricular systolic function is mildly reduced. Left Atrium: Left atrial size was normal in size. Right Atrium: Right atrial size was normal in size. Pericardium: There is no evidence of pericardial effusion. Mitral Valve: The mitral valve is grossly normal. No evidence of mitral valve regurgitation. No evidence of mitral valve stenosis. MV peak gradient, 3.6 mmHg. The mean mitral valve gradient is 2.0 mmHg. Tricuspid  Valve: The tricuspid valve is normal in structure. Tricuspid valve regurgitation is not demonstrated. No evidence of tricuspid stenosis. Aortic Valve: The aortic valve was not well visualized. Aortic valve regurgitation is not visualized. No aortic stenosis is present. Aortic valve mean gradient measures 2.0 mmHg. Aortic valve peak gradient measures 3.5 mmHg. Aortic valve area, by VTI measures 2.78 cm. Pulmonic Valve: The pulmonic valve was not well visualized. Pulmonic valve regurgitation is not visualized. No evidence of pulmonic stenosis. Aorta: The aortic root and ascending aorta are structurally normal, with no evidence of dilitation. Venous: The inferior vena cava is dilated in size with less than 50% respiratory variability, suggesting right atrial pressure of 15 mmHg. IAS/Shunts: The interatrial septum was not well visualized.  LEFT VENTRICLE PLAX 2D LVIDd:         4.60 cm LVIDs:         4.30 cm LV PW:         1.10 cm LV IVS:  0.90 cm LVOT diam:     2.10 cm LV SV:         35 LV SV Index:   18 LVOT Area:     3.46 cm  RIGHT VENTRICLE          IVC RV Basal diam:  3.30 cm  IVC diam: 2.20 cm TAPSE (M-mode): 1.1 cm RIGHT ATRIUM           Index RA Area:     18.00 cm RA Volume:   50.50 ml  26.09 ml/m  AORTIC VALVE AV Area (Vmax):    2.81 cm AV Area (Vmean):   2.69 cm AV Area (VTI):     2.78 cm AV Vmax:           93.40 cm/s AV Vmean:          64.200 cm/s AV VTI:            0.128 m AV Peak Grad:      3.5 mmHg AV Mean Grad:      2.0 mmHg LVOT Vmax:         75.83 cm/s LVOT Vmean:        49.867 cm/s LVOT VTI:          0.102 m LVOT/AV VTI ratio: 0.80  AORTA Ao Root diam: 3.10 cm Ao Asc diam:  3.30 cm MITRAL VALVE             TRICUSPID VALVE MV Area VTI:  2.19 cm   TR Peak grad:   17.5 mmHg MV Peak grad: 3.6 mmHg   TR Vmax:        209.00 cm/s MV Mean grad: 2.0 mmHg MV Vmax:      0.95 m/s   SHUNTS MV Vmean:     57.9 cm/s  Systemic VTI:  0.10 m                          Systemic Diam: 2.10 cm Stanly Leavens MD Electronically signed by Stanly Leavens MD Signature Date/Time: 06/18/2023/4:45:57 PM    Final    DG Chest 2 View Result Date: 06/18/2023 CLINICAL DATA:  Shortness of breath, chest congestion EXAM: CHEST - 2 VIEW COMPARISON:  09/12/2022 FINDINGS: Mild bibasilar atelectasis. No focal consolidation. No pleural effusion or pneumothorax. Heart is normal in size.  Thoracic aortic atherosclerosis. IMPRESSION: Mild bibasilar atelectasis. No acute cardiopulmonary disease. Electronically Signed   By: Pinkie Pebbles M.D.   On: 06/18/2023 03:33     Assessment and Plan:   New cardiomyopathy: The patient's echocardiogram demonstrates new severe LV dysfunction.  He is COVID-positive.  This does bring up the possibility of COVID myocarditis.  On my examination of the patient he is warm and well-perfused.  However I am concerned about this episode of tachycardia.  His atrial fibrillation rate right now is around the 100.  I do not suspect that he is in cardiogenic shock as he is normotensive and warm and well-perfused.SABRA  However I will check a lactate and LFTs to assess for a low output state.  If his lactate is elevated then inotrope should be started.  Again his examination was suggest that he is not in overt cardiogenic shock.  He will need coronary angiography at some point during this hospitalization.  His respiratory status seems to be improving.  We will consider coronary angiography with a right heart catheterization tomorrow.  His cardiomyopathy may also be due to his atrial fibrillation  and atrial flutter.  Will give Lasix  20 mg IV x 1.  Will make n.p.o. at midnight for potential coronary angiography and right heart catheterization tomorrow depending on respiratory status. Atrial fibrillation and atrial flutter: Continue Eliquis .  His ongoing COVID infection may make her rate control difficult.  Again we will check a lactate and hepatic function panel to exclude a low output  state. COVID infection: See discussion above; treatment per hospital medicine. COPD: Per hospital medicine.   Risk Assessment/Risk Scores:        New York  Heart Association (NYHA) Functional Class NYHA Class III  CHA2DS2-VASc Score = 4   This indicates a 4.8% annual risk of stroke. The patient's score is based upon: CHF History: 0 HTN History: 1 Diabetes History: 0 Stroke History: 0 Vascular Disease History: 1 Age Score: 2 Gender Score: 0         For questions or updates, please contact [facility] Please consult [email or web address] for contact info under    Signed, Britanie Harshman K Steven Veazie, MD  06/18/2023 5:59 PM

## 2023-06-19 ENCOUNTER — Inpatient Hospital Stay (HOSPITAL_COMMUNITY): Payer: No Typology Code available for payment source

## 2023-06-19 DIAGNOSIS — J9601 Acute respiratory failure with hypoxia: Secondary | ICD-10-CM | POA: Diagnosis not present

## 2023-06-19 LAB — URINALYSIS, ROUTINE W REFLEX MICROSCOPIC
Bilirubin Urine: NEGATIVE
Glucose, UA: NEGATIVE mg/dL
Hgb urine dipstick: NEGATIVE
Ketones, ur: NEGATIVE mg/dL
Leukocytes,Ua: NEGATIVE
Nitrite: NEGATIVE
Protein, ur: NEGATIVE mg/dL
Specific Gravity, Urine: 1.017 (ref 1.005–1.030)
pH: 5 (ref 5.0–8.0)

## 2023-06-19 LAB — PHOSPHORUS: Phosphorus: 4.2 mg/dL (ref 2.5–4.6)

## 2023-06-19 LAB — BASIC METABOLIC PANEL
Anion gap: 11 (ref 5–15)
BUN: 15 mg/dL (ref 8–23)
CO2: 22 mmol/L (ref 22–32)
Calcium: 8.6 mg/dL — ABNORMAL LOW (ref 8.9–10.3)
Chloride: 100 mmol/L (ref 98–111)
Creatinine, Ser: 0.99 mg/dL (ref 0.61–1.24)
GFR, Estimated: >60 mL/min (ref >60)
Glucose, Bld: 121 mg/dL — ABNORMAL HIGH (ref 70–99)
Potassium: 4.8 mmol/L (ref 3.5–5.1)
Sodium: 133 mmol/L — ABNORMAL LOW (ref 135–145)

## 2023-06-19 LAB — I-STAT CG4 LACTIC ACID, ED: Lactic Acid, Venous: 1.2 mmol/L (ref 0.5–1.9)

## 2023-06-19 LAB — CBC
HCT: 43.8 % (ref 39.0–52.0)
Hemoglobin: 14.9 g/dL (ref 13.0–17.0)
MCH: 29.6 pg (ref 26.0–34.0)
MCHC: 34 g/dL (ref 30.0–36.0)
MCV: 86.9 fL (ref 80.0–100.0)
Platelets: 305 10*3/uL (ref 150–400)
RBC: 5.04 MIL/uL (ref 4.22–5.81)
RDW: 13.7 % (ref 11.5–15.5)
WBC: 8.9 10*3/uL (ref 4.0–10.5)
nRBC: 0 % (ref 0.0–0.2)

## 2023-06-19 LAB — TROPONIN I (HIGH SENSITIVITY): Troponin I (High Sensitivity): 80 ng/L — ABNORMAL HIGH (ref <18)

## 2023-06-19 LAB — HEPARIN LEVEL (UNFRACTIONATED): Heparin Unfractionated: >1.1 [IU]/mL — ABNORMAL HIGH (ref 0.30–0.70)

## 2023-06-19 LAB — HEPATIC FUNCTION PANEL
ALT: 27 U/L (ref 0–44)
AST: 33 U/L (ref 15–41)
Albumin: 3 g/dL — ABNORMAL LOW (ref 3.5–5.0)
Alkaline Phosphatase: 62 U/L (ref 38–126)
Bilirubin, Direct: 0.1 mg/dL (ref 0.0–0.2)
Indirect Bilirubin: 0.7 mg/dL (ref 0.3–0.9)
Total Bilirubin: 0.8 mg/dL (ref 0.0–1.2)
Total Protein: 5.6 g/dL — ABNORMAL LOW (ref 6.5–8.1)

## 2023-06-19 LAB — APTT: aPTT: 119 s — ABNORMAL HIGH (ref 24–36)

## 2023-06-19 LAB — MAGNESIUM: Magnesium: 1.7 mg/dL (ref 1.7–2.4)

## 2023-06-19 LAB — TSH: TSH: 0.473 u[IU]/mL (ref 0.350–4.500)

## 2023-06-19 LAB — C-REACTIVE PROTEIN: CRP: 1.9 mg/dL — ABNORMAL HIGH (ref <1.0)

## 2023-06-19 LAB — PROCALCITONIN: Procalcitonin: 0.16 ng/mL

## 2023-06-19 LAB — BRAIN NATRIURETIC PEPTIDE: B Natriuretic Peptide: 453.9 pg/mL — ABNORMAL HIGH (ref 0.0–100.0)

## 2023-06-19 MED ORDER — ROSUVASTATIN CALCIUM 5 MG PO TABS
10.0000 mg | ORAL_TABLET | Freq: Every day | ORAL | Status: DC
  Administered 2023-06-19: 10 mg via ORAL
  Filled 2023-06-19: qty 2

## 2023-06-19 MED ORDER — HYDRALAZINE HCL 20 MG/ML IJ SOLN
10.0000 mg | Freq: Four times a day (QID) | INTRAMUSCULAR | Status: DC | PRN
Start: 2023-06-19 — End: 2023-06-21

## 2023-06-19 MED ORDER — SODIUM CHLORIDE 0.9 % IV SOLN: INTRAVENOUS | Status: AC

## 2023-06-19 MED ORDER — ROSUVASTATIN CALCIUM 20 MG PO TABS
40.0000 mg | ORAL_TABLET | Freq: Every day | ORAL | Status: DC
  Administered 2023-06-21: 40 mg via ORAL
  Filled 2023-06-19: qty 2

## 2023-06-19 MED ORDER — METHYLPREDNISOLONE SODIUM SUCC 40 MG IJ SOLR
40.0000 mg | Freq: Two times a day (BID) | INTRAMUSCULAR | Status: DC
  Administered 2023-06-19 – 2023-06-20 (×2): 40 mg via INTRAVENOUS
  Filled 2023-06-19 (×2): qty 1

## 2023-06-19 MED ORDER — SODIUM CHLORIDE 0.9 % IV BOLUS
250.0000 mL | INTRAVENOUS | Status: AC
Start: 2023-06-19 — End: 2023-06-19
  Administered 2023-06-19: 250 mL via INTRAVENOUS

## 2023-06-19 MED ORDER — ALBUMIN HUMAN 25 % IV SOLN
25.0000 g | INTRAVENOUS | Status: AC
  Administered 2023-06-19: 25 g via INTRAVENOUS
  Filled 2023-06-19: qty 100

## 2023-06-19 MED ORDER — ASPIRIN 81 MG PO CHEW
81.0000 mg | CHEWABLE_TABLET | ORAL | Status: AC
  Administered 2023-06-19: 81 mg via ORAL
  Filled 2023-06-19 (×2): qty 1

## 2023-06-19 NOTE — Plan of Care (Signed)
  Problem: Education: Goal: Knowledge of risk factors and measures for prevention of condition will improve Outcome: Progressing   Problem: Coping: Goal: Psychosocial and spiritual needs will be supported Outcome: Progressing   Problem: Respiratory: Goal: Will maintain a patent airway Outcome: Progressing Goal: Complications related to the disease process, condition or treatment will be avoided or minimized Outcome: Progressing   Problem: Education: Goal: Knowledge of General Education information will improve Description: Including pain rating scale, medication(s)/side effects and non-pharmacologic comfort measures Outcome: Progressing   Problem: Health Behavior/Discharge Planning: Goal: Ability to manage health-related needs will improve Outcome: Progressing   Problem: Clinical Measurements: Goal: Ability to maintain clinical measurements within normal limits will improve Outcome: Progressing Goal: Will remain free from infection Outcome: Progressing Goal: Diagnostic test results will improve Outcome: Progressing Goal: Respiratory complications will improve Outcome: Progressing Goal: Cardiovascular complication will be avoided Outcome: Progressing   Problem: Activity: Goal: Risk for activity intolerance will decrease Outcome: Progressing   Problem: Nutrition: Goal: Adequate nutrition will be maintained Outcome: Progressing   Problem: Coping: Goal: Level of anxiety will decrease Outcome: Progressing   Problem: Elimination: Goal: Will not experience complications related to bowel motility Outcome: Progressing Goal: Will not experience complications related to urinary retention Outcome: Progressing   Problem: Pain Management: Goal: General experience of comfort will improve Outcome: Progressing   Problem: Safety: Goal: Ability to remain free from injury will improve Outcome: Progressing   Problem: Skin Integrity: Goal: Risk for impaired skin integrity will  decrease Outcome: Progressing   Problem: Education: Goal: Understanding of CV disease, CV risk reduction, and recovery process will improve Outcome: Progressing Goal: Individualized Educational Video(s) Outcome: Progressing   Problem: Activity: Goal: Ability to return to baseline activity level will improve Outcome: Progressing   Problem: Cardiovascular: Goal: Ability to achieve and maintain adequate cardiovascular perfusion will improve Outcome: Progressing Goal: Vascular access site(s) Level 0-1 will be maintained Outcome: Progressing   Problem: Health Behavior/Discharge Planning: Goal: Ability to safely manage health-related needs after discharge will improve Outcome: Progressing

## 2023-06-19 NOTE — TOC CM/SW Note (Signed)
 Transition of Care [facility]) - Inpatient Brief Assessment   Patient Details  Name: Robert Adams MRN: [id number] Date of Birth: [birth year]  [facility] CM/SW Contact:    Jannice Mends, LCSW Phone Number: 06/19/2023, 4:49 PM   Clinical Narrative: Patient admitted from home with spouse undergoing workup for respiratory failure.   [facility] online notification completed, ID# [phone].  No current TOC needs identified.    Transition of Care Asessment: Insurance and Status: Insurance coverage has been reviewed Patient has primary care physician: Yes Home environment has been reviewed: From home Prior level of function:: Independent Prior/Current Home Services: No current home services Social Drivers of Health Review: SDOH reviewed no interventions necessary Readmission risk has been reviewed: Yes Transition of care needs: no transition of care needs at this time

## 2023-06-19 NOTE — Progress Notes (Signed)
 This pt has irregular HR ranging from sinus brady to afib and aflutter and has also 2.20 sec sinus pause @0445 . Pt has no any discomfort.Made MD C Hall aware.

## 2023-06-19 NOTE — Progress Notes (Signed)
   Patient Name: Robert Adams Date of Encounter: 06/19/2023 [facility] Cardiologist: Nanetta Batty, MD   Interval Summary  .    Received lasix 20mg  IV x 1 yesterday (-453). Feeling much better today.  Wants to go home  Vital Signs .    Vitals:   06/19/23 0527 06/19/23 0800 06/19/23 1007 06/19/23 1151  BP: 112/66 (!) 121/59  112/70  Pulse: (!) 51 60  60  Resp: 19 18  15   Temp:  (!) 97.4 F (36.3 C)  (!) 97.3 F (36.3 C)  TempSrc:  Oral  Oral  SpO2: 100% 100% 100% 100%  Weight:      Height:        Intake/Output Summary (Last 24 hours) at 06/19/2023 1406 Last data filed at 06/19/2023 1109 Gross per 24 hour  Intake 149.1 ml  Output 950 ml  Net -800.9 ml      06/19/2023    2:04 AM 06/18/2023    8:28 AM 06/18/2023    2:11 AM  Last 3 Weights  Weight (lbs) 166 lb 0.1 oz 165 lb 5.5 oz 165 lb 5.5 oz  Weight (kg) 75.3 kg 75 kg 75 kg      Telemetry/ECG    AF, rates in low 100s - Personally Reviewed  Physical Exam .   GEN: No acute distress.   Neck: JVP ~8cm Cardiac: iRRR, no murmurs, rubs, or gallops.  Respiratory: Clear to auscultation bilaterally. GI: Soft, nontender, non-distended  MS: No edema; +2 radial pulses  Assessment & Plan .     ACS:  Trops downtrending.  Given new CM and elevated troponins, coronary angiography today.  Increase crestor to 40.  New cardiomyopathy:  EF 20-25%.  Cath today to characterize CM.  Given improved respiratory status, will start Toprol 25mg  QPM, losartan 25mg  QPM for now.  Suspect CM due to AF.  Cor angio and RHC today.   AF:  Cont hep gtt.  Start Toprol as above.  Restart Eliquis after cath.  HL:  Increase crestor to 40mg   HTN:  Well controlled, will add Toprol and losartan as above.  I have reviewed the risks, indications, and alternatives to cardiac catheterization, possible angioplasty, and stenting with the patient. Risks include but are not limited to bleeding, infection, vascular injury, stroke, myocardial  infection, arrhythmia, kidney injury, radiation-related injury in the case of prolonged fluoroscopy use, emergency cardiac surgery, and death. The patient understands the risks of serious complication is 1-2 in 1000 with diagnostic cardiac cath and 1-2% or less with angioplasty/stenting.      For questions or updates, please contact [facility] Please consult [email or web address] for contact info under        Signed, Orbie Pyo, MD

## 2023-06-19 NOTE — Evaluation (Signed)
 Physical Therapy Evaluation and Discharge Patient Details Name: Robert Adams MRN: [id number] DOB: 22-May-1946 Today's Date: 06/19/2023  History of Present Illness  Patient is a 78 year old with respiratory failure with hypoxia, COVID-19, COPD exacerbation. History of COPD, remote tobacco abuse, BPH, and GERD  Clinical Impression  Patient is agreeable to PT evaluation. He reports he is independent at baseline with mobility and lives with his spouse.  Today, the patient is demonstrating high level of functional independence. He is Mod I for bed mobility, transfers, and progressed to Mod I with ambulation without assistive device. No loss of balance noted. Vitals stable on room air with heart rate 60-74bpm with activity. No apparent acute PT needs at this time. PT will sign off.       If plan is discharge home, recommend the following: Assist for transportation   Can travel by private vehicle        Equipment Recommendations None recommended by PT  Recommendations for Other Services       Functional Status Assessment Patient has not had a recent decline in their functional status     Precautions / Restrictions Precautions Precautions:  (low fall risk) Restrictions Weight Bearing Restrictions Per Provider Order: No      Mobility  Bed Mobility Overal bed mobility: Modified Independent                  Transfers Overall transfer level: Modified independent                      Ambulation/Gait Ambulation/Gait assistance: Supervision, Modified independent (Device/Increase time) Gait Distance (Feet): 75 Feet Assistive device: None Gait Pattern/deviations: Step-through pattern Gait velocity: decreased     General Gait Details: supervision initially progressing to Mod I (for increased time) with increased ambulation distance. heart rate 60-74bpm with mobility and vitals stable throughout session. no dizziness reported with upright activity  Stairs             Wheelchair Mobility     Tilt Bed    Modified Rankin (Stroke Patients Only)       Balance Overall balance assessment: No apparent balance deficits (not formally assessed)                                           Pertinent Vitals/Pain Pain Assessment Pain Assessment: No/denies pain    Home Living Family/patient expects to be discharged to:: Private residence Living Arrangements: Spouse/significant other Available Help at Discharge: Family;Available 24 hours/day Type of Home: House Home Access: Level entry       Home Layout: One level (bouns room upstairs not required) Home Equipment: Agricultural consultant (2 wheels)      Prior Function Prior Level of Function : Independent/Modified Independent                     Extremity/Trunk Assessment   Upper Extremity Assessment Upper Extremity Assessment: Overall WFL for tasks assessed    Lower Extremity Assessment Lower Extremity Assessment: Overall WFL for tasks assessed       Communication   Communication Communication: No apparent difficulties  Cognition Arousal: Alert Behavior During Therapy: WFL for tasks assessed/performed Overall Cognitive Status: Within Functional Limits for tasks assessed  General Comments      Exercises     Assessment/Plan    PT Assessment Patient does not need any further PT services  PT Problem List         PT Treatment Interventions      PT Goals (Current goals can be found in the Care Plan section)  Acute Rehab PT Goals PT Goal Formulation: All assessment and education complete, DC therapy    Frequency       Co-evaluation               AM-PAC PT "6 Clicks" Mobility  Outcome Measure Help needed turning from your back to your side while in a flat bed without using bedrails?: None Help needed moving from lying on your back to sitting on the side of a flat bed without using  bedrails?: None Help needed moving to and from a bed to a chair (including a wheelchair)?: None Help needed standing up from a chair using your arms (e.g., wheelchair or bedside chair)?: None Help needed to walk in hospital room?: None Help needed climbing 3-5 steps with a railing? : None 6 Click Score: 24    End of Session   Activity Tolerance: Patient tolerated treatment well Patient left: in bed;with call bell/phone within reach;with bed alarm set Nurse Communication: Mobility status PT Visit Diagnosis: Unsteadiness on feet (R26.81)    Time: 4098-1191 PT Time Calculation (min) (ACUTE ONLY): 15 min   Charges:   PT Evaluation $PT Eval Low Complexity: 1 Low   PT General Charges $$ ACUTE PT VISIT: 1 Visit        Ozie Bo, PT, MPT   Erlene Hawks 06/19/2023, 1:05 PM

## 2023-06-19 NOTE — H&P (View-Only) (Signed)
   Patient Name: Robert Adams Date of Encounter: 06/19/2023 [facility] Cardiologist: Nanetta Batty, MD   Interval Summary  .    Received lasix 20mg  IV x 1 yesterday (-453). Feeling much better today.  Wants to go home  Vital Signs .    Vitals:   06/19/23 0527 06/19/23 0800 06/19/23 1007 06/19/23 1151  BP: 112/66 (!) 121/59  112/70  Pulse: (!) 51 60  60  Resp: 19 18  15   Temp:  (!) 97.4 F (36.3 C)  (!) 97.3 F (36.3 C)  TempSrc:  Oral  Oral  SpO2: 100% 100% 100% 100%  Weight:      Height:        Intake/Output Summary (Last 24 hours) at 06/19/2023 1406 Last data filed at 06/19/2023 1109 Gross per 24 hour  Intake 149.1 ml  Output 950 ml  Net -800.9 ml      06/19/2023    2:04 AM 06/18/2023    8:28 AM 06/18/2023    2:11 AM  Last 3 Weights  Weight (lbs) 166 lb 0.1 oz 165 lb 5.5 oz 165 lb 5.5 oz  Weight (kg) 75.3 kg 75 kg 75 kg      Telemetry/ECG    AF, rates in low 100s - Personally Reviewed  Physical Exam .   GEN: No acute distress.   Neck: JVP ~8cm Cardiac: iRRR, no murmurs, rubs, or gallops.  Respiratory: Clear to auscultation bilaterally. GI: Soft, nontender, non-distended  MS: No edema; +2 radial pulses  Assessment & Plan .     ACS:  Trops downtrending.  Given new CM and elevated troponins, coronary angiography today.  Increase crestor to 40.  New cardiomyopathy:  EF 20-25%.  Cath today to characterize CM.  Given improved respiratory status, will start Toprol 25mg  QPM, losartan 25mg  QPM for now.  Suspect CM due to AF.  Cor angio and RHC today.   AF:  Cont hep gtt.  Start Toprol as above.  Restart Eliquis after cath.  HL:  Increase crestor to 40mg   HTN:  Well controlled, will add Toprol and losartan as above.  I have reviewed the risks, indications, and alternatives to cardiac catheterization, possible angioplasty, and stenting with the patient. Risks include but are not limited to bleeding, infection, vascular injury, stroke, myocardial  infection, arrhythmia, kidney injury, radiation-related injury in the case of prolonged fluoroscopy use, emergency cardiac surgery, and death. The patient understands the risks of serious complication is 1-2 in 1000 with diagnostic cardiac cath and 1-2% or less with angioplasty/stenting.      For questions or updates, please contact [facility] Please consult [email or web address] for contact info under        Signed, Orbie Pyo, MD

## 2023-06-19 NOTE — Progress Notes (Signed)
 PHARMACY - ANTICOAGULATION CONSULT NOTE  Pharmacy Consult for Heparin  Indication: atrial fibrillation  Allergies  Allergen Reactions   Grass Extracts [Gramineae Pollens]     Other reaction(s): Other (See Comments) Sneezing   Mirtazapine Rash   Penicillins Palpitations    Patient Measurements: Height: 5' 10.5" (179.1 cm) Weight: 75.3 kg (166 lb 0.1 oz) IBW/kg (Calculated) : 74.15 Heparin  Dosing Weight: 75kg  Vital Signs: Temp: 97.4 F (36.3 C) (01/15 0800) Temp Source: Oral (01/15 0800) BP: 121/59 (01/15 0800) Pulse Rate: 60 (01/15 0800)  Labs: Recent Labs    06/18/23 0305 06/18/23 0637 06/19/23 0449 06/19/23 0815  HGB 14.6  --  14.9  --   HCT 43.6  --  43.8  --   PLT 313  --  305  --   APTT  --   --   --  119*  HEPARINUNFRC  --   --   --  >1.10*  CREATININE 0.87  --  0.99  --   TROPONINIHS 353* 659*  --  80*    Estimated Creatinine Clearance: 62.5 mL/min (by C-G formula based on SCr of 0.99 mg/dL).   Medical History: Past Medical History:  Diagnosis Date   Allergy    seasonal   Anxiety    Arthritis    BPH (benign prostatic hyperplasia)    COPD (chronic obstructive pulmonary disease) (HCC)    Depression    GERD (gastroesophageal reflux disease)    Glaucoma    Hypertension    Sleep apnea     Medications:  Scheduled:   arformoterol   15 mcg Nebulization BID   And   umeclidinium bromide   1 puff Inhalation Daily   aspirin   81 mg Oral Pre-Cath   escitalopram   10 mg Oral Daily   finasteride   5 mg Oral QPM   methylPREDNISolone  (SOLU-MEDROL ) injection  40 mg Intravenous Q12H   rosuvastatin   10 mg Oral Daily   sodium chloride  flush  3 mL Intravenous Q12H   tamsulosin   0.4 mg Oral QHS   traZODone   50 mg Oral QHS   Infusions:   sodium chloride  10 mL/hr at 06/19/23 0315   cefTRIAXone  (ROCEPHIN )  IV Stopped (06/18/23 1656)   famotidine  (PEPCID ) IV Stopped (06/18/23 2349)   heparin  1,100 Units/hr (06/19/23 0007)    Assessment: 78YOM with h/o afib on  Eliquis  admitted with COVID. Last Eliquis  dose was 1/14 in the ED @ 1243. Pharmacy has been consulted to dose heparin .  Initial aPTT 119 sec supratherapeutic on 1100 units/hr.  Anti-Xa level > 1.1 impacted by recent Eliquis  use.  Level drawn appropriately - drawn from L arm, heparin  running through right PIV.  CBC stable.  No bleeding / infusion issues noted by RN.  Goal of Therapy:  Heparin  level 0.3-0.7 units/ml aPTT 66-102 seconds Monitor platelets by anticoagulation protocol: Yes   Plan:  Decrease heparin  infusion to 950 units/hr F/u anticoagulation plans after [facility] today  Avian Konigsberg U Paytes 06/19/2023,10:39 AM

## 2023-06-19 NOTE — Plan of Care (Signed)
  Problem: Education: Goal: Knowledge of General Education information will improve Description: Including pain rating scale, medication(s)/side effects and non-pharmacologic comfort measures 06/19/2023 0343 by Rondall Codding, RN Outcome: Progressing 06/19/2023 0342 by Rondall Codding, RN Outcome: Progressing   Problem: Health Behavior/Discharge Planning: Goal: Ability to manage health-related needs will improve Outcome: Progressing   Problem: Clinical Measurements: Goal: Ability to maintain clinical measurements within normal limits will improve Outcome: Progressing Goal: Respiratory complications will improve 06/19/2023 0343 by Rondall Codding, RN Outcome: Progressing 06/19/2023 0342 by Rondall Codding, RN Outcome: Progressing

## 2023-06-19 NOTE — Progress Notes (Addendum)
 Overnight cross coverage.    Received a call from bedside RN regarding the patient having 2.20-second sinus pause around 0445 while asleep.  Also becoming bradycardic.  Beta-blockers held.  Bedside RN made cardiology aware.  Twelve-lead EKG ordered due to bradycardia to rule out a heart block.  Addendum:  250 cc NS and 25 g IV albumin  added to maintain MAP greater than 65.  Will continue to closely monitor and treat as indicated.  No charge note.

## 2023-06-19 NOTE — Progress Notes (Signed)
 PROGRESS NOTE                                                                                                                                                                                                             Patient Demographics:    Robert Adams, is a 78 y.o. male, DOB - Jul 31, 1945, ZOX:[id number]  Outpatient Primary MD for the patient is Rosilyn Cool., MD    LOS - 1  Admit date - 06/18/2023    Chief Complaint  Patient presents with   Shortness of Breath       Brief Narrative (HPI from H&P)   78 y.o. male with medical history significant of hypertension COPD, remote tobacco abuse, BPH, and GERD presented to the hospital due to worsening respiratory symptoms. He reported a significant increase in shortness of breath with report of having a deep cough over the past three days.  The patient had been experiencing these symptoms for over the last week, but not to this extent.   The patient had been on a course of prednisone  and Augmentin, prescribed approximately 9-10 days ago by the [place]. Despite this treatment, his symptoms have progressively worsened. The patient was diagnosed with COVID-19 two days ago, although he had been symptomatic for a longer period.   The patient has a past history of smoking but quit approximately 12 years ago. He does not typically require oxygen  therapy. The patient's worsening respiratory symptoms, in the context of his recent COVID-19 diagnosis, necessitated hospital admission for further management and monitoring.   Subjective:    Robert Adams today has, No headache, No chest pain, No abdominal pain - No Nausea, No new weakness tingling or numbness, improved shortness of breath   Assessment  & Plan :   Respiratory failure with hypoxia gradually progressive over several months with mild decline over the last 3 to 4 days.  His presentation is consistent with acute on chronic  systolic heart failure EF 20 to 25% which is a decline from his echocardiogram done 6 years ago with a EF of 55%, he has gradually progressive exertional shortness of breath lasting several months, he also has COVID-19 infection which I think is mild as his inflammatory markers are relatively stable.  For now continue steroids, oxygen  supplementation, up in chair for I-S and flutter valve for pulmonary toiletry.  His mainstay of treatment will be of his underlying cardiomyopathy and CHF.  Cardiology on board have discussed the case with the cardiologist this morning.   NSTEMI, acute on chronic systolic heart failure EF now down to 20% drop from 55% in 2019.  Gradually progressive exertional shortness of breath for several months.   He is currently on heparin  drip, aspirin , no beta-blocker as he developed some bradycardia night of 06/18/2023, TSH stable, case discussed with cardiologist on 06/19/2023 left heart catheterization later today to evaluate his coronaries.  Currently relatively stable from CHF standpoint, as needed diuretics, for ACE/ARB/Entresto to the cardiology team.  Beta-blocker being held due to bradycardia.     Paroxysmal atrial fibrillation on chronic anticoagulation Currently in rate control, off of beta-blocker due to bradycardia on 06/18/2023, continue Eliquis .   Essential hypertension Blood pressures currently stable as needed hydralazine  added   Anxiety -Continue Lexapro  continue trazodone  at night   BPH -Continue finasteride  and tamsulosin         Condition - Extremely Guarded  Family Communication  :  None  Code Status :  Full  Consults  :  Cards  PUD Prophylaxis :    Procedures  :     TTE -  1. Left ventricular ejection fraction, by estimation, is 20 to 25%. The left ventricle has severely decreased function. The left ventricle demonstrates global hypokinesis. There is mild concentric left ventricular hypertrophy. Left ventricular diastolic  parameters are  indeterminate.  2. Right ventricular systolic function is mildly reduced. The right ventricular size is normal.  3. The mitral valve is grossly normal. No evidence of mitral valve regurgitation. No evidence of mitral stenosis.  4. The aortic valve was not well visualized. Aortic valve regurgitation is not visualized. No aortic stenosis is present.  5. The inferior vena cava is dilated in size with <50% respiratory variability, suggesting right atrial pressure of 15 mmHg. Comparison(s): LVEF is markedly worse from prior reporting. Reaching out to primary team      Disposition Plan  :    Status is: Inpatient   DVT Prophylaxis  :  Hep Gtt   Lab Results  Component Value Date   PLT 305 06/19/2023    Diet :  Diet Order             Diet NPO time specified Except for: Sips with Meds  Diet effective midnight                    Inpatient Medications  Scheduled Meds:  arformoterol   15 mcg Nebulization BID   And   umeclidinium bromide   1 puff Inhalation Daily   aspirin   81 mg Oral Pre-Cath   escitalopram   10 mg Oral Daily   finasteride   5 mg Oral QPM   methylPREDNISolone  (SOLU-MEDROL ) injection  40 mg Intravenous Q12H   rosuvastatin   10 mg Oral Daily   sodium chloride  flush  3 mL Intravenous Q12H   tamsulosin   0.4 mg Oral QHS   traZODone   50 mg Oral QHS   Continuous Infusions:  sodium chloride  10 mL/hr at 06/19/23 0315   cefTRIAXone  (ROCEPHIN )  IV Stopped (06/18/23 1656)   famotidine  (PEPCID ) IV Stopped (06/18/23 2349)   heparin  1,100 Units/hr (06/19/23 0007)   PRN Meds:.acetaminophen  **OR** acetaminophen , guaiFENesin -codeine , ondansetron  **OR** ondansetron  (ZOFRAN ) IV  Antibiotics  :    Anti-infectives (From admission, onward)    Start     Dose/Rate Route Frequency Ordered Stop   06/19/23 1000  remdesivir  100 mg  in sodium chloride  0.9 % 100 mL IVPB  Status:  Discontinued       Placed in "Followed by" Linked Group   100 mg 200 mL/hr over 30 Minutes Intravenous Daily  06/18/23 1501 06/18/23 1523   06/18/23 1515  remdesivir  200 mg in sodium chloride  0.9% 250 mL IVPB  Status:  Discontinued       Placed in "Followed by" Linked Group   200 mg 580 mL/hr over 30 Minutes Intravenous Once 06/18/23 1501 06/18/23 1523   06/18/23 1515  cefTRIAXone  (ROCEPHIN ) 2 g in sodium chloride  0.9 % 100 mL IVPB        2 g 200 mL/hr over 30 Minutes Intravenous Daily 06/18/23 1505           Objective:   Vitals:   06/19/23 0319 06/19/23 0527 06/19/23 0800 06/19/23 1007  BP: 103/75 112/66 (!) 121/59   Pulse: 75 (!) 51 60   Resp: 17 19 18    Temp: 97.6 F (36.4 C)  (!) 97.4 F (36.3 C)   TempSrc: Oral  Oral   SpO2: 99% 100% 100% 100%  Weight:      Height:        Wt Readings from Last 3 Encounters:  06/19/23 75.3 kg  06/13/23 74.9 kg  09/24/22 74.9 kg     Intake/Output Summary (Last 24 hours) at 06/19/2023 1042 Last data filed at 06/19/2023 0829 Gross per 24 hour  Intake 146.1 ml  Output 950 ml  Net -803.9 ml     Physical Exam  Awake Alert, No new F.N deficits, Normal affect [place].AT,PERRAL Supple Neck, No JVD,   Symmetrical Chest wall movement, Good air movement bilaterally, CTAB RRR,No Gallops,Rubs or new Murmurs,  +ve B.Sounds, Abd Soft, No tenderness,   No Cyanosis, Clubbing or edema     Data Review:    Recent Labs  Lab 06/18/23 0305 06/19/23 0449  WBC 13.0* 8.9  HGB 14.6 14.9  HCT 43.6 43.8  PLT 313 305  MCV 88.4 86.9  MCH 29.6 29.6  MCHC 33.5 34.0  RDW 13.8 13.7  LYMPHSABS 0.6*  --   MONOABS 1.7*  --   EOSABS 0.0  --   BASOSABS 0.0  --     Recent Labs  Lab 06/18/23 0305 06/18/23 0637 06/19/23 0044 06/19/23 0048 06/19/23 0112 06/19/23 0449 06/19/23 0815  NA 135  --   --   --   --  133*  --   K 4.2  --   --   --   --  4.8  --   CL 104  --   --   --   --  100  --   CO2 19*  --   --   --   --  22  --   ANIONGAP 12  --   --   --   --  11  --   GLUCOSE 97  --   --   --   --  121*  --   BUN 15  --   --   --   --  15  --    CREATININE 0.87  --   --   --   --  0.99  --   AST  --   --   --  33  --   --   --   ALT  --   --   --  27  --   --   --   ALKPHOS  --   --   --  62  --   --   --   BILITOT  --   --   --  0.8  --   --   --   ALBUMIN   --   --   --  3.0*  --   --   --   CRP  --   --   --   --   --   --  1.9*  PROCALCITON  --  0.15 0.16  --   --   --   --   LATICACIDVEN  --   --   --   --  1.2  --   --   TSH  --   --  0.473  --   --   --   --   BNP 365.8*  --   --   --   --  453.9*  --   MG  --   --  1.7  --   --   --   --   PHOS  --   --   --   --   --   --  4.2  CALCIUM  8.9  --   --   --   --  8.6*  --       Recent Labs  Lab 06/18/23 0305 06/18/23 0637 06/19/23 0044 06/19/23 0112 06/19/23 0449 06/19/23 0815  CRP  --   --   --   --   --  1.9*  PROCALCITON  --  0.15 0.16  --   --   --   LATICACIDVEN  --   --   --  1.2  --   --   TSH  --   --  0.473  --   --   --   BNP 365.8*  --   --   --  453.9*  --   MG  --   --  1.7  --   --   --   CALCIUM  8.9  --   --   --  8.6*  --     --------------------------------------------------------------------------------------------------------------- Lab Results  Component Value Date   CHOL 166 08/23/2017   HDL 60 08/23/2017   LDLCALC 92 08/23/2017   TRIG 68 08/23/2017   CHOLHDL 2.8 08/23/2017    No results found for: "HGBA1C" Recent Labs    06/19/23 0044  TSH 0.473   Micro Results Recent Results (from the past 240 hours)  Resp panel by RT-PCR (RSV, Flu A&B, Covid) Anterior Nasal Swab     Status: Abnormal   Collection Time: 06/18/23  3:05 AM   Specimen: Anterior Nasal Swab  Result Value Ref Range Status   SARS Coronavirus 2 by RT PCR POSITIVE (A) NEGATIVE Final   Influenza A by PCR NEGATIVE NEGATIVE Final   Influenza B by PCR NEGATIVE NEGATIVE Final    Comment: (NOTE) The Xpert Xpress SARS-CoV-2/FLU/RSV plus assay is intended as an aid in the diagnosis of influenza from Nasopharyngeal swab specimens and should not be used as a sole basis  for treatment. Nasal washings and aspirates are unacceptable for Xpert Xpress SARS-CoV-2/FLU/RSV testing.  Fact Sheet for Patients: BloggerCourse.com  Fact Sheet for Healthcare Providers: SeriousBroker.it  This test is not yet approved or cleared by the [place]  FDA and has been authorized for detection and/or diagnosis of SARS-CoV-2 by FDA under an Emergency Use Authorization (EUA). This EUA will remain in effect (meaning this test can be used) for the duration of the COVID-19 declaration under  Section 564(b)(1) of the Act, 21 U.S.C. section 360bbb-3(b)(1), unless the authorization is terminated or revoked.     Resp Syncytial Virus by PCR NEGATIVE NEGATIVE Final    Comment: (NOTE) Fact Sheet for Patients: BloggerCourse.com  Fact Sheet for Healthcare Providers: SeriousBroker.it  This test is not yet approved or cleared by the [place]  FDA and has been authorized for detection and/or diagnosis of SARS-CoV-2 by FDA under an Emergency Use Authorization (EUA). This EUA will remain in effect (meaning this test can be used) for the duration of the COVID-19 declaration under Section 564(b)(1) of the Act, 21 U.S.C. section 360bbb-3(b)(1), unless the authorization is terminated or revoked.  Performed at [facility], [address] [place], [place] [address]     Radiology Reports DG Chest [place] 1 View Result Date: 06/19/2023 CLINICAL DATA:  78 year old male with history of shortness of breath. EXAM: PORTABLE CHEST 1 VIEW COMPARISON:  Chest x-ray 06/18/2023. FINDINGS: Lung volumes are normal. No consolidative airspace disease. Mild scarring in the left lung base, unchanged. No pleural effusions. No pneumothorax. No pulmonary nodule or mass noted. Pulmonary vasculature and the cardiomediastinal silhouette are within normal limits. Atherosclerosis in the thoracic aorta.  IMPRESSION: 1.  No radiographic evidence of acute cardiopulmonary disease. 2. Aortic atherosclerosis. Electronically Signed   By: Alexandria Angel M.D.   On: 06/19/2023 07:02   ECHOCARDIOGRAM COMPLETE Result Date: 06/18/2023    ECHOCARDIOGRAM REPORT   Patient Name:   QUATEZ HILT Date of Exam: 06/18/2023 Medical Rec #:  [id number]      Height:       70.5 in Accession #:    [id number]     Weight:       165.3 lb Date of Birth:  26-Sep-1945       BSA:          1.935 m Patient Age:    78 years       BP:           111/74 mmHg Patient Gender: M              HR:           110 bpm. Exam Location:  Inpatient Procedure: 2D Echo, Cardiac Doppler and Color Doppler Indications:    Elevated troponins  History:        Patient has prior history of Echocardiogram examinations, most                 recent 06/24/2017. COPD, Arrythmias:Atrial Fibrillation; Risk                 Factors:Hypertension.  Sonographer:    Amy Chionchio Referring Phys: Manny Sees, A IMPRESSIONS  1. Left ventricular ejection fraction, by estimation, is 20 to 25%. The left ventricle has severely decreased function. The left ventricle demonstrates global hypokinesis. There is mild concentric left ventricular hypertrophy. Left ventricular diastolic  parameters are indeterminate.  2. Right ventricular systolic function is mildly reduced. The right ventricular size is normal.  3. The mitral valve is grossly normal. No evidence of mitral valve regurgitation. No evidence of mitral stenosis.  4. The aortic valve was not well visualized. Aortic valve regurgitation is not visualized. No aortic stenosis is present.  5. The inferior vena cava is dilated in size with <50% respiratory variability, suggesting right atrial pressure of 15 mmHg. Comparison(s): LVEF is markedly worse from prior reporting. Reaching out to primary team. FINDINGS  Left Ventricle: Left ventricular ejection fraction, by estimation, is 20  to 25%. The left ventricle has severely decreased function.  The left ventricle demonstrates global hypokinesis. The left ventricular internal cavity size was normal in size. There is mild concentric left ventricular hypertrophy. Left ventricular diastolic parameters are indeterminate. Right Ventricle: The right ventricular size is normal. No increase in right ventricular wall thickness. Right ventricular systolic function is mildly reduced. Left Atrium: Left atrial size was normal in size. Right Atrium: Right atrial size was normal in size. Pericardium: There is no evidence of pericardial effusion. Mitral Valve: The mitral valve is grossly normal. No evidence of mitral valve regurgitation. No evidence of mitral valve stenosis. MV peak gradient, 3.6 mmHg. The mean mitral valve gradient is 2.0 mmHg. Tricuspid Valve: The tricuspid valve is normal in structure. Tricuspid valve regurgitation is not demonstrated. No evidence of tricuspid stenosis. Aortic Valve: The aortic valve was not well visualized. Aortic valve regurgitation is not visualized. No aortic stenosis is present. Aortic valve mean gradient measures 2.0 mmHg. Aortic valve peak gradient measures 3.5 mmHg. Aortic valve area, by VTI measures 2.78 cm. Pulmonic Valve: The pulmonic valve was not well visualized. Pulmonic valve regurgitation is not visualized. No evidence of pulmonic stenosis. Aorta: The aortic root and ascending aorta are structurally normal, with no evidence of dilitation. Venous: The inferior vena cava is dilated in size with less than 50% respiratory variability, suggesting right atrial pressure of 15 mmHg. IAS/Shunts: The interatrial septum was not well visualized.  LEFT VENTRICLE PLAX 2D LVIDd:         4.60 cm LVIDs:         4.30 cm LV PW:         1.10 cm LV IVS:        0.90 cm LVOT diam:     2.10 cm LV SV:         35 LV SV Index:   18 LVOT Area:     3.46 cm  RIGHT VENTRICLE          IVC RV Basal diam:  3.30 cm  IVC diam: 2.20 cm TAPSE (M-mode): 1.1 cm RIGHT ATRIUM           Index RA Area:     18.00  cm RA Volume:   50.50 ml  26.09 ml/m  AORTIC VALVE AV Area (Vmax):    2.81 cm AV Area (Vmean):   2.69 cm AV Area (VTI):     2.78 cm AV Vmax:           93.40 cm/s AV Vmean:          64.200 cm/s AV VTI:            0.128 m AV Peak Grad:      3.5 mmHg AV Mean Grad:      2.0 mmHg LVOT Vmax:         75.83 cm/s LVOT Vmean:        49.867 cm/s LVOT VTI:          0.102 m LVOT/AV VTI ratio: 0.80  AORTA Ao Root diam: 3.10 cm Ao Asc diam:  3.30 cm MITRAL VALVE             TRICUSPID VALVE MV Area VTI:  2.19 cm   TR Peak grad:   17.5 mmHg MV Peak grad: 3.6 mmHg   TR Vmax:        209.00 cm/s MV Mean grad: 2.0 mmHg MV Vmax:      0.95 m/s   SHUNTS MV Vmean:  57.9 cm/s  Systemic VTI:  0.10 m                          Systemic Diam: 2.10 cm Gloriann Larger MD Electronically signed by Gloriann Larger MD Signature Date/Time: 06/18/2023/4:45:57 PM    Final    DG Chest 2 View Result Date: 06/18/2023 CLINICAL DATA:  Shortness of breath, chest congestion EXAM: CHEST - 2 VIEW COMPARISON:  09/12/2022 FINDINGS: Mild bibasilar atelectasis. No focal consolidation. No pleural effusion or pneumothorax. Heart is normal in size.  Thoracic aortic atherosclerosis. IMPRESSION: Mild bibasilar atelectasis. No acute cardiopulmonary disease. Electronically Signed   By: Zadie Herter M.D.   On: 06/18/2023 03:33      Signature  -   Lynnwood Sauer M.D on 06/19/2023 at 10:42 AM   -  To page go to [email or web address]

## 2023-06-20 ENCOUNTER — Other Ambulatory Visit (HOSPITAL_COMMUNITY): Payer: Self-pay

## 2023-06-20 ENCOUNTER — Encounter (HOSPITAL_COMMUNITY): Payer: Self-pay | Admitting: Internal Medicine

## 2023-06-20 ENCOUNTER — Encounter (HOSPITAL_COMMUNITY)
Admission: EM | Disposition: A | Discharge status: Home / Self Care | Payer: Self-pay | Source: Home / Self Care | Attending: Internal Medicine

## 2023-06-20 DIAGNOSIS — J9601 Acute respiratory failure with hypoxia: Secondary | ICD-10-CM

## 2023-06-20 DIAGNOSIS — I2510 Atherosclerotic heart disease of native coronary artery without angina pectoris: Secondary | ICD-10-CM | POA: Diagnosis not present

## 2023-06-20 DIAGNOSIS — I5021 Acute systolic (congestive) heart failure: Secondary | ICD-10-CM

## 2023-06-20 HISTORY — PX: RIGHT/LEFT HEART CATH AND CORONARY ANGIOGRAPHY: CATH118266

## 2023-06-20 LAB — POCT I-STAT 7, (LYTES, BLD GAS, ICA,H+H)
Acid-base deficit: 4 mmol/L — ABNORMAL HIGH (ref 0.0–2.0)
Bicarbonate: 18.8 mmol/L — ABNORMAL LOW (ref 20.0–28.0)
Calcium, Ion: 0.86 mmol/L — CL (ref 1.15–1.40)
HCT: 33 % — ABNORMAL LOW (ref 39.0–52.0)
Hemoglobin: 11.2 g/dL — ABNORMAL LOW (ref 13.0–17.0)
O2 Saturation: 99 %
Potassium: 3.3 mmol/L — ABNORMAL LOW (ref 3.5–5.1)
Sodium: 144 mmol/L (ref 135–145)
TCO2: 20 mmol/L — ABNORMAL LOW (ref 22–32)
pCO2 arterial: 27.4 mm[Hg] — ABNORMAL LOW (ref 32–48)
pH, Arterial: 7.445 (ref 7.35–7.45)
pO2, Arterial: 144 mm[Hg] — ABNORMAL HIGH (ref 83–108)

## 2023-06-20 LAB — BRAIN NATRIURETIC PEPTIDE: B Natriuretic Peptide: 197.8 pg/mL — ABNORMAL HIGH (ref 0.0–100.0)

## 2023-06-20 LAB — POCT I-STAT EG7
Acid-Base Excess: 0 mmol/L (ref 0.0–2.0)
Acid-base deficit: 1 mmol/L (ref 0.0–2.0)
Bicarbonate: 23.4 mmol/L (ref 20.0–28.0)
Bicarbonate: 24.1 mmol/L (ref 20.0–28.0)
Calcium, Ion: 1.17 mmol/L (ref 1.15–1.40)
Calcium, Ion: 1.22 mmol/L (ref 1.15–1.40)
HCT: 38 % — ABNORMAL LOW (ref 39.0–52.0)
HCT: 39 % (ref 39.0–52.0)
Hemoglobin: 12.9 g/dL — ABNORMAL LOW (ref 13.0–17.0)
Hemoglobin: 13.3 g/dL (ref 13.0–17.0)
O2 Saturation: 75 %
O2 Saturation: 75 %
Potassium: 4.1 mmol/L (ref 3.5–5.1)
Potassium: 4.2 mmol/L (ref 3.5–5.1)
Sodium: 138 mmol/L (ref 135–145)
Sodium: 138 mmol/L (ref 135–145)
TCO2: 25 mmol/L (ref 22–32)
TCO2: 25 mmol/L (ref 22–32)
pCO2, Ven: 36.8 mm[Hg] — ABNORMAL LOW (ref 44–60)
pCO2, Ven: 37.7 mm[Hg] — ABNORMAL LOW (ref 44–60)
pH, Ven: 7.412 (ref 7.25–7.43)
pH, Ven: 7.414 (ref 7.25–7.43)
pO2, Ven: 39 mm[Hg] (ref 32–45)
pO2, Ven: 40 mm[Hg] (ref 32–45)

## 2023-06-20 LAB — BASIC METABOLIC PANEL
Anion gap: 8 (ref 5–15)
BUN: 28 mg/dL — ABNORMAL HIGH (ref 8–23)
CO2: 23 mmol/L (ref 22–32)
Calcium: 8.3 mg/dL — ABNORMAL LOW (ref 8.9–10.3)
Chloride: 102 mmol/L (ref 98–111)
Creatinine, Ser: 0.98 mg/dL (ref 0.61–1.24)
GFR, Estimated: >60 mL/min (ref >60)
Glucose, Bld: 146 mg/dL — ABNORMAL HIGH (ref 70–99)
Potassium: 4.1 mmol/L (ref 3.5–5.1)
Sodium: 133 mmol/L — ABNORMAL LOW (ref 135–145)

## 2023-06-20 LAB — CBC WITH DIFFERENTIAL/PLATELET
Abs Immature Granulocytes: 0.13 10*3/uL — ABNORMAL HIGH (ref 0.00–0.07)
Basophils Absolute: 0 10*3/uL (ref 0.0–0.1)
Basophils Relative: 0 %
Eosinophils Absolute: 0 10*3/uL (ref 0.0–0.5)
Eosinophils Relative: 0 %
HCT: 40.4 % (ref 39.0–52.0)
Hemoglobin: 13.9 g/dL (ref 13.0–17.0)
Immature Granulocytes: 1 %
Lymphocytes Relative: 6 %
Lymphs Abs: 0.8 10*3/uL (ref 0.7–4.0)
MCH: 29.7 pg (ref 26.0–34.0)
MCHC: 34.4 g/dL (ref 30.0–36.0)
MCV: 86.3 fL (ref 80.0–100.0)
Monocytes Absolute: 1.1 10*3/uL — ABNORMAL HIGH (ref 0.1–1.0)
Monocytes Relative: 7 %
Neutro Abs: 12.8 10*3/uL — ABNORMAL HIGH (ref 1.7–7.7)
Neutrophils Relative %: 86 %
Platelets: 289 10*3/uL (ref 150–400)
RBC: 4.68 MIL/uL (ref 4.22–5.81)
RDW: 13.6 % (ref 11.5–15.5)
WBC: 14.9 10*3/uL — ABNORMAL HIGH (ref 4.0–10.5)
nRBC: 0 % (ref 0.0–0.2)

## 2023-06-20 LAB — MAGNESIUM: Magnesium: 2.2 mg/dL (ref 1.7–2.4)

## 2023-06-20 LAB — LIPID PANEL
Cholesterol: 130 mg/dL (ref 0–200)
HDL: 45 mg/dL (ref >40)
LDL Cholesterol: 74 mg/dL (ref 0–99)
Total CHOL/HDL Ratio: 2.9 {ratio}
Triglycerides: 55 mg/dL (ref <150)
VLDL: 11 mg/dL (ref 0–40)

## 2023-06-20 LAB — C-REACTIVE PROTEIN: CRP: 1 mg/dL — ABNORMAL HIGH (ref <1.0)

## 2023-06-20 LAB — PROCALCITONIN: Procalcitonin: <0.1 ng/mL

## 2023-06-20 LAB — HEPARIN LEVEL (UNFRACTIONATED): Heparin Unfractionated: 0.92 [IU]/mL — ABNORMAL HIGH (ref 0.30–0.70)

## 2023-06-20 LAB — APTT: aPTT: 88 s — ABNORMAL HIGH (ref 24–36)

## 2023-06-20 SURGERY — RIGHT/LEFT HEART CATH AND CORONARY ANGIOGRAPHY
Anesthesia: LOCAL

## 2023-06-20 MED ORDER — LIDOCAINE HCL (PF) 1 % IJ SOLN
INTRAMUSCULAR | Status: DC | PRN
  Administered 2023-06-20: 2 mL
  Administered 2023-06-20: 5 mL

## 2023-06-20 MED ORDER — HEPARIN SODIUM (PORCINE) 1000 UNIT/ML IJ SOLN
INTRAMUSCULAR | Status: DC | PRN
  Administered 2023-06-20: 3500 [IU] via INTRAVENOUS

## 2023-06-20 MED ORDER — SODIUM CHLORIDE 0.9 % IV SOLN: INTRAVENOUS | Status: DC

## 2023-06-20 MED ORDER — HYDRALAZINE HCL 20 MG/ML IJ SOLN
INTRAMUSCULAR | Status: DC | PRN
  Administered 2023-06-20: 10 mg via INTRAVENOUS

## 2023-06-20 MED ORDER — ASPIRIN 81 MG PO CHEW: 81.0000 mg | CHEWABLE_TABLET | Freq: Once | ORAL | Status: DC

## 2023-06-20 MED ORDER — HYDRALAZINE HCL 20 MG/ML IJ SOLN: 10.0000 mg | INTRAMUSCULAR | Status: AC | PRN

## 2023-06-20 MED ORDER — SODIUM CHLORIDE 0.9% FLUSH
3.0000 mL | Freq: Two times a day (BID) | INTRAVENOUS | Status: DC
  Administered 2023-06-20 – 2023-06-21 (×2): 3 mL via INTRAVENOUS

## 2023-06-20 MED ORDER — VERAPAMIL HCL 2.5 MG/ML IV SOLN
INTRAVENOUS | Status: DC | PRN
  Administered 2023-06-20: 10 mL via INTRA_ARTERIAL

## 2023-06-20 MED ORDER — METOPROLOL SUCCINATE ER 25 MG PO TB24
25.0000 mg | ORAL_TABLET | Freq: Every evening | ORAL | Status: DC
Start: 2023-06-20 — End: 2023-06-21

## 2023-06-20 MED ORDER — LIDOCAINE HCL (PF) 1 % IJ SOLN
INTRAMUSCULAR | Status: AC
  Filled 2023-06-20: qty 30

## 2023-06-20 MED ORDER — VERAPAMIL HCL 2.5 MG/ML IV SOLN
INTRAVENOUS | Status: AC
  Filled 2023-06-20: qty 2

## 2023-06-20 MED ORDER — IOHEXOL 350 MG/ML SOLN
INTRAVENOUS | Status: DC | PRN
  Administered 2023-06-20: 65 mL via INTRA_ARTERIAL

## 2023-06-20 MED ORDER — ACETAMINOPHEN 325 MG PO TABS: 650.0000 mg | ORAL_TABLET | ORAL | Status: DC | PRN

## 2023-06-20 MED ORDER — LABETALOL HCL 5 MG/ML IV SOLN: 10.0000 mg | INTRAVENOUS | Status: AC | PRN

## 2023-06-20 MED ORDER — SODIUM CHLORIDE 0.9% FLUSH: 3.0000 mL | INTRAVENOUS | Status: DC | PRN

## 2023-06-20 MED ORDER — HEPARIN (PORCINE) IN NACL 1000-0.9 UT/500ML-% IV SOLN
INTRAVENOUS | Status: DC | PRN
  Administered 2023-06-20 (×2): 500 mL via INTRA_ARTERIAL

## 2023-06-20 MED ORDER — APIXABAN 5 MG PO TABS
5.0000 mg | ORAL_TABLET | Freq: Two times a day (BID) | ORAL | Status: DC
  Administered 2023-06-20 – 2023-06-21 (×2): 5 mg via ORAL
  Filled 2023-06-20 (×2): qty 1

## 2023-06-20 MED ORDER — ENOXAPARIN SODIUM 40 MG/0.4ML IJ SOSY: 40.0000 mg | PREFILLED_SYRINGE | INTRAMUSCULAR | Status: DC

## 2023-06-20 MED ORDER — ONDANSETRON HCL 4 MG/2ML IJ SOLN: 4.0000 mg | Freq: Four times a day (QID) | INTRAMUSCULAR | Status: DC | PRN

## 2023-06-20 MED ORDER — SODIUM CHLORIDE 0.9 % IV SOLN
250.0000 mL | INTRAVENOUS | Status: DC | PRN
Start: 2023-06-20 — End: 2023-06-21

## 2023-06-20 MED ORDER — HYDRALAZINE HCL 20 MG/ML IJ SOLN
INTRAMUSCULAR | Status: AC
  Filled 2023-06-20: qty 1

## 2023-06-20 MED ORDER — LOSARTAN POTASSIUM 25 MG PO TABS
25.0000 mg | ORAL_TABLET | Freq: Every evening | ORAL | Status: DC
Start: 2023-06-20 — End: 2023-06-21

## 2023-06-20 MED ORDER — HEPARIN SODIUM (PORCINE) 1000 UNIT/ML IJ SOLN
INTRAMUSCULAR | Status: AC
  Filled 2023-06-20: qty 10

## 2023-06-20 SURGICAL SUPPLY — 13 items
CATH 5FR JL3.5 JR4 ANG PIG MP (CATHETERS) IMPLANT
CATH BALLN WEDGE 5F 110CM (CATHETERS) IMPLANT
GLIDESHEATH SLEND SS 6F .021 (SHEATH) IMPLANT
GUIDEWIRE .025 260CM (WIRE) IMPLANT
GUIDEWIRE INQWIRE 1.5J.035X260 (WIRE) IMPLANT
INQWIRE 1.5J .035X260CM (WIRE) ×1
KIT HEART LEFT (KITS) IMPLANT
PACK CARDIAC CATHETERIZATION (CUSTOM PROCEDURE TRAY) ×1 IMPLANT
SHEATH GLIDE SLENDER 4/5FR (SHEATH) IMPLANT
SHEATH PROBE COVER 6X72 (BAG) IMPLANT
TRANSDUCER W/STOPCOCK (MISCELLANEOUS) IMPLANT
WIRE MICRO SET SILHO 5FR 7 (SHEATH) IMPLANT
WIRE MICROINTRODUCER 60CM (WIRE) IMPLANT

## 2023-06-20 NOTE — Progress Notes (Signed)
PHARMACY - ANTICOAGULATION CONSULT NOTE  Pharmacy Consult for Heparin Indication: atrial fibrillation  Allergies  Allergen Reactions   Grass Extracts [Gramineae Pollens]     Other reaction(s): Other (See Comments) Sneezing   Mirtazapine Rash   Penicillins Palpitations    Patient Measurements: Height: 5' 10.5" (179.1 cm) Weight: 73.5 kg (162 lb) IBW/kg (Calculated) : 74.15 Heparin Dosing Weight: 75kg  Vital Signs: Temp: 97.4 F (36.3 C) (01/16 0755) Temp Source: Oral (01/16 0755) BP: 125/66 (01/16 0755) Pulse Rate: 60 (01/16 0755)  Labs: Recent Labs    06/18/23 0305 06/18/23 6213 06/19/23 0449 06/19/23 0815 06/20/23 0534 06/20/23 1159  HGB 14.6  --  14.9  --  13.9  --   HCT 43.6  --  43.8  --  40.4  --   PLT 313  --  305  --  289  --   APTT  --   --   --  119*  --  88*  HEPARINUNFRC  --   --   --  >1.10*  --  0.92*  CREATININE 0.87  --  0.99  --  0.98  --   TROPONINIHS 353* 659*  --  80*  --   --     Estimated Creatinine Clearance: 62.5 mL/min (by C-G formula based on SCr of 0.98 mg/dL).   Medical History: Past Medical History:  Diagnosis Date   Allergy    seasonal   Anxiety    Arthritis    BPH (benign prostatic hyperplasia)    COPD (chronic obstructive pulmonary disease) (HCC)    Depression    GERD (gastroesophageal reflux disease)    Glaucoma    Hypertension    Sleep apnea     Medications:  Scheduled:   arformoterol  15 mcg Nebulization BID   And   umeclidinium bromide  1 puff Inhalation Daily   aspirin  81 mg Oral Once   escitalopram  10 mg Oral Daily   finasteride  5 mg Oral QPM   losartan  25 mg Oral QPM   metoprolol succinate  25 mg Oral QPM   rosuvastatin  40 mg Oral Daily   sodium chloride flush  3 mL Intravenous Q12H   tamsulosin  0.4 mg Oral QHS   traZODone  50 mg Oral QHS   Infusions:   sodium chloride     famotidine (PEPCID) IV 20 mg (06/20/23 1236)   heparin 950 Units/hr (06/20/23 0010)    Assessment: 78YOM with h/o  afib on Eliquis admitted with COVID. Last Eliquis dose was 1/14 in the ED @ 1243. Pharmacy has been consulted to dose heparin.  aPTT 88 sec is therapeutic on 950 units/hr.  Anti-Xa level 0.92 still impacted by recent Eliquis use.  Level drawn appropriately. CBC stable.  No bleeding / infusion issues noted by RN.  Goal of Therapy:  Heparin level 0.3-0.7 units/ml aPTT 66-102 seconds Monitor platelets by anticoagulation protocol: Yes   Plan:  Heparin infusion to 950 units/hr F/u anticoagulation plans after [facility] today  Trixie Rude, PharmD Clinical Pharmacist 06/20/2023  12:47 PM

## 2023-06-20 NOTE — Progress Notes (Addendum)
PROGRESS NOTE                                                                                                                                                                                                             Patient Demographics:    Robert Adams, is a 78 y.o. male, DOB - 1945-07-03, YQM:[id number]  Outpatient Primary MD for the patient is Wilhemina Bonito., MD    LOS - 2  Admit date - 06/18/2023    Chief Complaint  Patient presents with   Shortness of Breath       Brief Narrative (HPI from H&P)   78 y.o. male with medical history significant of hypertension COPD, remote tobacco abuse, BPH, and GERD presented to the hospital due to worsening respiratory symptoms. He reported a significant increase in shortness of breath with report of having a deep cough over the past three days.  The patient had been experiencing these symptoms for over the last week, but not to this extent.   The patient had been on a course of prednisone and Augmentin, prescribed approximately 9-10 days ago by the [place]. Despite this treatment, his symptoms have progressively worsened. The patient was diagnosed with COVID-19 two days ago, although he had been symptomatic for a longer period.   The patient has a past history of smoking but quit approximately 12 years ago. He does not typically require oxygen therapy. The patient's worsening respiratory symptoms, in the context of his recent COVID-19 diagnosis, necessitated hospital admission for further management and monitoring.   Subjective:    Robert Adams today has, No headache, No chest pain, No abdominal pain - No Nausea, No new weakness tingling or numbness, improved shortness of breath   Assessment  & Plan :   Respiratory failure with hypoxia gradually progressive over several months with mild decline over the last 3 to 4 days.  His presentation is consistent with acute on chronic  systolic heart failure EF 20 to 25% which is a decline from his echocardiogram done 6 years ago with a EF of 55%, he has gradually progressive exertional shortness of breath lasting several months, he also has COVID-19 infection which I think is mild as his inflammatory markers are relatively stable.  He is stable from the COVID standpoint will get his last dose of Solu-Medrol on 06/20/2023, stop  antibiotics, his respiratory status is back to his baseline, oxygen supplementation via nasal cannula as needed, up in chair for I-S and flutter valve for pulmonary toiletry.    His mainstay of treatment will be of his underlying cardiomyopathy and CHF.  Cardiology on board have discussed the case with the cardiologist, left heart catheterization on 06/20/2023.   NSTEMI, acute on chronic systolic heart failure EF now down to 20% drop from 55% in 2019.  Gradually progressive exertional shortness of breath for several months.   He is currently on heparin drip, aspirin, no beta-blocker as he developed some bradycardia night of 06/18/2023, TSH stable, case discussed with cardiologist on 06/19/2023 left heart catheterization on 06/20/2023 to evaluate his coronaries.  Currently relatively stable from CHF standpoint, as needed diuretics, for ACE/ARB/Entresto to the cardiology team.  Beta-blocker being held due to bradycardia.     Paroxysmal atrial fibrillation on chronic anticoagulation  Currently in rate control, off of beta-blocker due to bradycardia on 06/18/2023, continue Eliquis.   Essential hypertension  Blood pressures currently stable as needed hydralazine added   Anxiety  -Continue Lexapro continue trazodone at night   BPH  -Continue finasteride and tamsulosin   Mild steroid-induced leukocytosis.  No acute issues monitor.  Last steroid dose 06/20/2023.       Condition - Extremely Guarded  Family Communication  :  None  Code Status :  Full  Consults  :  Cards  PUD Prophylaxis :    Procedures  :      TTE -  1. Left ventricular ejection fraction, by estimation, is 20 to 25%. The left ventricle has severely decreased function. The left ventricle demonstrates global hypokinesis. There is mild concentric left ventricular hypertrophy. Left ventricular diastolic  parameters are indeterminate.  2. Right ventricular systolic function is mildly reduced. The right ventricular size is normal.  3. The mitral valve is grossly normal. No evidence of mitral valve regurgitation. No evidence of mitral stenosis.  4. The aortic valve was not well visualized. Aortic valve regurgitation is not visualized. No aortic stenosis is present.  5. The inferior vena cava is dilated in size with <50% respiratory variability, suggesting right atrial pressure of 15 mmHg. Comparison(s): LVEF is markedly worse from prior reporting. Reaching out to primary team      Disposition Plan  :    Status is: Inpatient   DVT Prophylaxis  :  Hep Gtt   Lab Results  Component Value Date   PLT 289 06/20/2023    Diet :  Diet Order             Diet NPO time specified  Diet effective midnight                    Inpatient Medications  Scheduled Meds:  arformoterol  15 mcg Nebulization BID   And   umeclidinium bromide  1 puff Inhalation Daily   aspirin  81 mg Oral Once   escitalopram  10 mg Oral Daily   finasteride  5 mg Oral QPM   rosuvastatin  40 mg Oral Daily   sodium chloride flush  3 mL Intravenous Q12H   tamsulosin  0.4 mg Oral QHS   traZODone  50 mg Oral QHS   Continuous Infusions:  sodium chloride     cefTRIAXone (ROCEPHIN)  IV Stopped (06/19/23 1119)   famotidine (PEPCID) IV 20 mg (06/19/23 2131)   heparin 950 Units/hr (06/20/23 0010)   PRN Meds:.acetaminophen **OR** acetaminophen, guaiFENesin-codeine, hydrALAZINE, ondansetron **OR**  ondansetron (ZOFRAN) IV  Antibiotics  :    Anti-infectives (From admission, onward)    Start     Dose/Rate Route Frequency Ordered Stop   06/19/23 1000  remdesivir  100 mg in sodium chloride 0.9 % 100 mL IVPB  Status:  Discontinued       Placed in "Followed by" Linked Group   100 mg 200 mL/hr over 30 Minutes Intravenous Daily 06/18/23 1501 06/18/23 1523   06/18/23 1515  remdesivir 200 mg in sodium chloride 0.9% 250 mL IVPB  Status:  Discontinued       Placed in "Followed by" Linked Group   200 mg 580 mL/hr over 30 Minutes Intravenous Once 06/18/23 1501 06/18/23 1523   06/18/23 1515  cefTRIAXone (ROCEPHIN) 2 g in sodium chloride 0.9 % 100 mL IVPB        2 g 200 mL/hr over 30 Minutes Intravenous Daily 06/18/23 1505           Objective:   Vitals:   06/20/23 0000 06/20/23 0410 06/20/23 0541 06/20/23 0755  BP: 124/61   125/66  Pulse: 62 (!) 53 (!) 46 60  Resp: 17  18 17   Temp: (!) 97.2 F (36.2 C) (!) 97.3 F (36.3 C)  (!) 97.4 F (36.3 C)  TempSrc:  Oral  Oral  SpO2: 97% 98% 100% 98%  Weight:   73.5 kg   Height:        Wt Readings from Last 3 Encounters:  06/20/23 73.5 kg  06/13/23 74.9 kg  09/24/22 74.9 kg     Intake/Output Summary (Last 24 hours) at 06/20/2023 0931 Last data filed at 06/20/2023 0000 Gross per 24 hour  Intake 890.15 ml  Output 725 ml  Net 165.15 ml     Physical Exam  Awake Alert, No new F.N deficits, Normal affect [place].AT,PERRAL Supple Neck, No JVD,   Symmetrical Chest wall movement, Good air movement bilaterally, CTAB RRR,No Gallops,Rubs or new Murmurs,  +ve B.Sounds, Abd Soft, No tenderness,   No Cyanosis, Clubbing or edema     Data Review:    Recent Labs  Lab 06/18/23 0305 06/19/23 0449 06/20/23 0534  WBC 13.0* 8.9 14.9*  HGB 14.6 14.9 13.9  HCT 43.6 43.8 40.4  PLT 313 305 289  MCV 88.4 86.9 86.3  MCH 29.6 29.6 29.7  MCHC 33.5 34.0 34.4  RDW 13.8 13.7 13.6  LYMPHSABS 0.6*  --  0.8  MONOABS 1.7*  --  1.1*  EOSABS 0.0  --  0.0  BASOSABS 0.0  --  0.0    Recent Labs  Lab 06/18/23 0305 06/18/23 0637 06/19/23 0044 06/19/23 0048 06/19/23 0112 06/19/23 0449 06/19/23 0815 06/20/23 0534   NA 135  --   --   --   --  133*  --  133*  K 4.2  --   --   --   --  4.8  --  4.1  CL 104  --   --   --   --  100  --  102  CO2 19*  --   --   --   --  22  --  23  ANIONGAP 12  --   --   --   --  11  --  8  GLUCOSE 97  --   --   --   --  121*  --  146*  BUN 15  --   --   --   --  15  --  28*  CREATININE 0.87  --   --   --   --  0.99  --  0.98  AST  --   --   --  33  --   --   --   --   ALT  --   --   --  27  --   --   --   --   ALKPHOS  --   --   --  62  --   --   --   --   BILITOT  --   --   --  0.8  --   --   --   --   ALBUMIN  --   --   --  3.0*  --   --   --   --   CRP  --   --   --   --   --   --  1.9* 1.0*  PROCALCITON  --  0.15 0.16  --   --   --   --  <0.10  LATICACIDVEN  --   --   --   --  1.2  --   --   --   TSH  --   --  0.473  --   --   --   --   --   BNP 365.8*  --   --   --   --  453.9*  --  197.8*  MG  --   --  1.7  --   --   --   --  2.2  PHOS  --   --   --   --   --   --  4.2  --   CALCIUM 8.9  --   --   --   --  8.6*  --  8.3*      Recent Labs  Lab 06/18/23 0305 06/18/23 0637 06/19/23 0044 06/19/23 0112 06/19/23 0449 06/19/23 0815 06/20/23 0534  CRP  --   --   --   --   --  1.9* 1.0*  PROCALCITON  --  0.15 0.16  --   --   --  <0.10  LATICACIDVEN  --   --   --  1.2  --   --   --   TSH  --   --  0.473  --   --   --   --   BNP 365.8*  --   --   --  453.9*  --  197.8*  MG  --   --  1.7  --   --   --  2.2  CALCIUM 8.9  --   --   --  8.6*  --  8.3*    --------------------------------------------------------------------------------------------------------------- Lab Results  Component Value Date   CHOL 130 06/20/2023   HDL 45 06/20/2023   LDLCALC 74 06/20/2023   TRIG 55 06/20/2023   CHOLHDL 2.9 06/20/2023    No results found for: "HGBA1C" Recent Labs    06/19/23 0044  TSH 0.473   Micro Results Recent Results (from the past 240 hours)  Resp panel by RT-PCR (RSV, Flu A&B, Covid) Anterior Nasal Swab     Status: Abnormal   Collection Time:  06/18/23  3:05 AM   Specimen: Anterior Nasal Swab  Result Value Ref Range Status   SARS Coronavirus 2 by RT PCR POSITIVE (A) NEGATIVE Final   Influenza A by PCR NEGATIVE NEGATIVE Final   Influenza B by PCR NEGATIVE  NEGATIVE Final    Comment: (NOTE) The Xpert Xpress SARS-CoV-2/FLU/RSV plus assay is intended as an aid in the diagnosis of influenza from Nasopharyngeal swab specimens and should not be used as a sole basis for treatment. Nasal washings and aspirates are unacceptable for Xpert Xpress SARS-CoV-2/FLU/RSV testing.  Fact Sheet for Patients: BloggerCourse.com  Fact Sheet for Healthcare Providers: SeriousBroker.it  This test is not yet approved or cleared by the [place] FDA and has been authorized for detection and/or diagnosis of SARS-CoV-2 by FDA under an Emergency Use Authorization (EUA). This EUA will remain in effect (meaning this test can be used) for the duration of the COVID-19 declaration under Section 564(b)(1) of the Act, 21 U.S.C. section 360bbb-3(b)(1), unless the authorization is terminated or revoked.     Resp Syncytial Virus by PCR NEGATIVE NEGATIVE Final    Comment: (NOTE) Fact Sheet for Patients: BloggerCourse.com  Fact Sheet for Healthcare Providers: SeriousBroker.it  This test is not yet approved or cleared by the [place] FDA and has been authorized for detection and/or diagnosis of SARS-CoV-2 by FDA under an Emergency Use Authorization (EUA). This EUA will remain in effect (meaning this test can be used) for the duration of the COVID-19 declaration under Section 564(b)(1) of the Act, 21 U.S.C. section 360bbb-3(b)(1), unless the authorization is terminated or revoked.  Performed at [facility], [address], [place], [place] [address]     Radiology Reports DG Chest [facility] 1 View Result Date: 06/19/2023 CLINICAL DATA:   78 year old male with history of shortness of breath. EXAM: PORTABLE CHEST 1 VIEW COMPARISON:  Chest x-ray 06/18/2023. FINDINGS: Lung volumes are normal. No consolidative airspace disease. Mild scarring in the left lung base, unchanged. No pleural effusions. No pneumothorax. No pulmonary nodule or mass noted. Pulmonary vasculature and the cardiomediastinal silhouette are within normal limits. Atherosclerosis in the thoracic aorta. IMPRESSION: 1.  No radiographic evidence of acute cardiopulmonary disease. 2. Aortic atherosclerosis. Electronically Signed   By: Trudie Reed M.D.   On: 06/19/2023 07:02   ECHOCARDIOGRAM COMPLETE Result Date: 06/18/2023    ECHOCARDIOGRAM REPORT   Patient Name:   Robert Adams Date of Exam: 06/18/2023 Medical Rec #:  [id number]      Height:       70.5 in Accession #:    [id number]     Weight:       165.3 lb Date of Birth:  30-Mar-1946       BSA:          1.935 m Patient Age:    78 years       BP:           111/74 mmHg Patient Gender: M              HR:           110 bpm. Exam Location:  Inpatient Procedure: 2D Echo, Cardiac Doppler and Color Doppler Indications:    Elevated troponins  History:        Patient has prior history of Echocardiogram examinations, most                 recent 06/24/2017. COPD, Arrythmias:Atrial Fibrillation; Risk                 Factors:Hypertension.  Sonographer:    Amy Chionchio Referring Phys: Madelyn Flavors, A IMPRESSIONS  1. Left ventricular ejection fraction, by estimation, is 20 to 25%. The left ventricle has severely decreased function. The left ventricle demonstrates global hypokinesis.  There is mild concentric left ventricular hypertrophy. Left ventricular diastolic  parameters are indeterminate.  2. Right ventricular systolic function is mildly reduced. The right ventricular size is normal.  3. The mitral valve is grossly normal. No evidence of mitral valve regurgitation. No evidence of mitral stenosis.  4. The aortic valve was not well visualized.  Aortic valve regurgitation is not visualized. No aortic stenosis is present.  5. The inferior vena cava is dilated in size with <50% respiratory variability, suggesting right atrial pressure of 15 mmHg. Comparison(s): LVEF is markedly worse from prior reporting. Reaching out to primary team. FINDINGS  Left Ventricle: Left ventricular ejection fraction, by estimation, is 20 to 25%. The left ventricle has severely decreased function. The left ventricle demonstrates global hypokinesis. The left ventricular internal cavity size was normal in size. There is mild concentric left ventricular hypertrophy. Left ventricular diastolic parameters are indeterminate. Right Ventricle: The right ventricular size is normal. No increase in right ventricular wall thickness. Right ventricular systolic function is mildly reduced. Left Atrium: Left atrial size was normal in size. Right Atrium: Right atrial size was normal in size. Pericardium: There is no evidence of pericardial effusion. Mitral Valve: The mitral valve is grossly normal. No evidence of mitral valve regurgitation. No evidence of mitral valve stenosis. MV peak gradient, 3.6 mmHg. The mean mitral valve gradient is 2.0 mmHg. Tricuspid Valve: The tricuspid valve is normal in structure. Tricuspid valve regurgitation is not demonstrated. No evidence of tricuspid stenosis. Aortic Valve: The aortic valve was not well visualized. Aortic valve regurgitation is not visualized. No aortic stenosis is present. Aortic valve mean gradient measures 2.0 mmHg. Aortic valve peak gradient measures 3.5 mmHg. Aortic valve area, by VTI measures 2.78 cm. Pulmonic Valve: The pulmonic valve was not well visualized. Pulmonic valve regurgitation is not visualized. No evidence of pulmonic stenosis. Aorta: The aortic root and ascending aorta are structurally normal, with no evidence of dilitation. Venous: The inferior vena cava is dilated in size with less than 50% respiratory variability, suggesting  right atrial pressure of 15 mmHg. IAS/Shunts: The interatrial septum was not well visualized.  LEFT VENTRICLE PLAX 2D LVIDd:         4.60 cm LVIDs:         4.30 cm LV PW:         1.10 cm LV IVS:        0.90 cm LVOT diam:     2.10 cm LV SV:         35 LV SV Index:   18 LVOT Area:     3.46 cm  RIGHT VENTRICLE          IVC RV Basal diam:  3.30 cm  IVC diam: 2.20 cm TAPSE (M-mode): 1.1 cm RIGHT ATRIUM           Index RA Area:     18.00 cm RA Volume:   50.50 ml  26.09 ml/m  AORTIC VALVE AV Area (Vmax):    2.81 cm AV Area (Vmean):   2.69 cm AV Area (VTI):     2.78 cm AV Vmax:           93.40 cm/s AV Vmean:          64.200 cm/s AV VTI:            0.128 m AV Peak Grad:      3.5 mmHg AV Mean Grad:      2.0 mmHg LVOT Vmax:  75.83 cm/s LVOT Vmean:        49.867 cm/s LVOT VTI:          0.102 m LVOT/AV VTI ratio: 0.80  AORTA Ao Root diam: 3.10 cm Ao Asc diam:  3.30 cm MITRAL VALVE             TRICUSPID VALVE MV Area VTI:  2.19 cm   TR Peak grad:   17.5 mmHg MV Peak grad: 3.6 mmHg   TR Vmax:        209.00 cm/s MV Mean grad: 2.0 mmHg MV Vmax:      0.95 m/s   SHUNTS MV Vmean:     57.9 cm/s  Systemic VTI:  0.10 m                          Systemic Diam: 2.10 cm Riley Lam MD Electronically signed by Riley Lam MD Signature Date/Time: 06/18/2023/4:45:57 PM    Final    DG Chest 2 View Result Date: 06/18/2023 CLINICAL DATA:  Shortness of breath, chest congestion EXAM: CHEST - 2 VIEW COMPARISON:  09/12/2022 FINDINGS: Mild bibasilar atelectasis. No focal consolidation. No pleural effusion or pneumothorax. Heart is normal in size.  Thoracic aortic atherosclerosis. IMPRESSION: Mild bibasilar atelectasis. No acute cardiopulmonary disease. Electronically Signed   By: Charline Bills M.D.   On: 06/18/2023 03:33      Signature  -   Susa Raring M.D on 06/20/2023 at 9:31 AM   -  To page go to [email or web address]

## 2023-06-20 NOTE — Progress Notes (Signed)
Patient is alert, awake , resting quietly, ambulatory. Patient went to restroom , took off IV  tubing heparin drip . The nurse found IV on the bed @ 0330, 30 minutes after Patient took IV off . Pharmacy notified.

## 2023-06-20 NOTE — Progress Notes (Signed)
   06/20/23 1223  Mobility  Activity Ambulated independently in hallway  Level of Assistance Modified independent, requires aide device or extra time  Assistive Device None  Distance Ambulated (ft) 350 ft  Activity Response Tolerated fair  Mobility Referral Yes  Mobility visit 1 Mobility  Mobility Specialist Start Time (ACUTE ONLY) 1200  Mobility Specialist Stop Time (ACUTE ONLY) 1223  Mobility Specialist Time Calculation (min) (ACUTE ONLY) 23 min   Mobility Specialist: Progress Note  During Mobility: HR 80s, SpO2 94-97% RA Post-Mobility: HR 60, SpO2 100% RA  Pt agreeable to mobility session - received in bed. Required ModI with no AD. Pt with consistent cough. Returned to chair with all needs met - call bell within reach.  Pt wearing mask while ambulating in hallway.   Robert Adams, BS Mobility Specialist Please contact via SecureChat or Rehab office at [phone].

## 2023-06-20 NOTE — Progress Notes (Signed)
   Heart Failure Stewardship Pharmacist Progress Note   PCP: Wilhemina Bonito., MD PCP-Cardiologist: Nanetta Batty, MD    HPI:  78 yo M with PMH hypertension, afib on Eliquis, COPD, remote tobacco use, BPH, and GERD. Presented to the ED with worsening SOB and cough. [facility], but symptoms did not improve. Tested positive for COVID. In the ED trop 353->659->80 and BNP 365->453->197. Previous cardiac cath in 2019 showed normal coronaries and ECHO with EF 50-55%, G1DD. ECHO on 1/14 showed EF reduced to 20-25%, global hypokinesis, mild concentric LVH, RV function mildly reduced, dilated inferior vena cava. Initial EKG with NSR, but telemetry on 1/14 with afib, rate low 100s. Received IV furosemide 20 mg x1 and was started on metoprolol XL. Due to bradycardia the night of 1/14, metoprolol was stopped. Cardiac cath scheduled for today to evaluate causes for new CM.  Patient reports feeling okay today. States his SOB is improving. Still has a cough. Denies chest pain. No LEE. Reports he was able to get up and walk around today and did have some lightheadedness/dizziness. Patient expressed concern regarding some hallucinations over the past couple days - seeing faces in the window and thought that his dog was in his bed. Eagerly awaiting cardiac cath today.  Current HF Medications: None  Prior to admission HF Medications: None - metoprolol tartrate 25 mg every 8 hrs as needed for afib HR>100 as long as SBP>100 - patient reported not taking  Pertinent Lab Values: Serum creatinine 0.98, BUN 28, Potassium 4.1, Sodium 133, BNP 197.8, Magnesium 2.2  Vital Signs: Weight: 162 lbs (admission weight: 165 lbs) Blood pressure: 112-145/60s  Heart rate: ranging 46-137, mainly ~60s  I/O: net -0.7 L yesterday; net -0.6 L since admission  Medication Assistance / Insurance Benefits Check: Does the patient have prescription insurance?   Yes Type of insurance plan: [facility] + [employer]  Outpatient Pharmacy:  [facility] Is the patient willing to use [facility] pharmacy at discharge? Yes Is the patient willing to transition their outpatient pharmacy to utilize a [facility] outpatient pharmacy?   No    Assessment: 1. Acute on chronic systolic CHF (LVEF 20-25%), due to unclear etiology. Pending cardiac cath. NYHA class II symptoms. - Appears euvolemic on exam. No diuretics indicated. - Agree with initiation of losartan 25 mg daily given elevated BP and new HFrEF - Recommend to hold metoprolol given HR 50s today and some lightheadedness/dizziness when ambulating - Consider initiation of SGLT-2 and spiro prior to discharge after cardiac cath.   Plan: 1) Medication changes recommended at this time: - Hold off on initiation of metoprolol   2) Patient assistance: - Patient gets all of his medications from the [place] at no cost  3)  Education  - Initial education completed, full education to be completed prior to discharge   Jarrett Ables, PharmD PGY-1 Pharmacy Resident  Sharen Hones, PharmD, BCPS Heart Failure Stewardship Pharmacist Phone [phone]

## 2023-06-20 NOTE — Plan of Care (Signed)
Problem: Education: Goal: Knowledge of risk factors and measures for prevention of condition will improve 06/20/2023 1213 by Horris Latino, RN Outcome: Progressing 06/20/2023 1213 by Horris Latino, RN Outcome: Progressing   Problem: Coping: Goal: Psychosocial and spiritual needs will be supported 06/20/2023 1213 by Horris Latino, RN Outcome: Progressing 06/20/2023 1213 by Horris Latino, RN Outcome: Progressing   Problem: Respiratory: Goal: Will maintain a patent airway 06/20/2023 1213 by Horris Latino, RN Outcome: Progressing 06/20/2023 1213 by Horris Latino, RN Outcome: Progressing Goal: Complications related to the disease process, condition or treatment will be avoided or minimized 06/20/2023 1213 by Horris Latino, RN Outcome: Progressing 06/20/2023 1213 by Horris Latino, RN Outcome: Progressing   Problem: Education: Goal: Knowledge of General Education information will improve Description: Including pain rating scale, medication(s)/side effects and non-pharmacologic comfort measures 06/20/2023 1213 by Horris Latino, RN Outcome: Progressing 06/20/2023 1213 by Horris Latino, RN Outcome: Progressing   Problem: Health Behavior/Discharge Planning: Goal: Ability to manage health-related needs will improve 06/20/2023 1213 by Horris Latino, RN Outcome: Progressing 06/20/2023 1213 by Horris Latino, RN Outcome: Progressing   Problem: Clinical Measurements: Goal: Ability to maintain clinical measurements within normal limits will improve 06/20/2023 1213 by Horris Latino, RN Outcome: Progressing 06/20/2023 1213 by Horris Latino, RN Outcome: Progressing Goal: Will remain free from infection 06/20/2023 1213 by Horris Latino, RN Outcome: Progressing 06/20/2023 1213 by Horris Latino, RN Outcome: Progressing Goal: Diagnostic test results will improve 06/20/2023 1213 by Horris Latino, RN Outcome: Progressing 06/20/2023 1213 by Horris Latino, RN Outcome: Progressing Goal: Respiratory complications will  improve 06/20/2023 1213 by Horris Latino, RN Outcome: Progressing 06/20/2023 1213 by Horris Latino, RN Outcome: Progressing Goal: Cardiovascular complication will be avoided 06/20/2023 1213 by Horris Latino, RN Outcome: Progressing 06/20/2023 1213 by Horris Latino, RN Outcome: Progressing   Problem: Activity: Goal: Risk for activity intolerance will decrease 06/20/2023 1213 by Horris Latino, RN Outcome: Progressing 06/20/2023 1213 by Horris Latino, RN Outcome: Progressing   Problem: Nutrition: Goal: Adequate nutrition will be maintained 06/20/2023 1213 by Horris Latino, RN Outcome: Progressing 06/20/2023 1213 by Horris Latino, RN Outcome: Progressing   Problem: Coping: Goal: Level of anxiety will decrease 06/20/2023 1213 by Horris Latino, RN Outcome: Progressing 06/20/2023 1213 by Horris Latino, RN Outcome: Progressing   Problem: Elimination: Goal: Will not experience complications related to bowel motility 06/20/2023 1213 by Horris Latino, RN Outcome: Progressing 06/20/2023 1213 by Horris Latino, RN Outcome: Progressing Goal: Will not experience complications related to urinary retention 06/20/2023 1213 by Horris Latino, RN Outcome: Progressing 06/20/2023 1213 by Horris Latino, RN Outcome: Progressing   Problem: Pain Management: Goal: General experience of comfort will improve 06/20/2023 1213 by Horris Latino, RN Outcome: Progressing 06/20/2023 1213 by Horris Latino, RN Outcome: Progressing   Problem: Safety: Goal: Ability to remain free from injury will improve 06/20/2023 1213 by Horris Latino, RN Outcome: Progressing 06/20/2023 1213 by Horris Latino, RN Outcome: Progressing   Problem: Skin Integrity: Goal: Risk for impaired skin integrity will decrease 06/20/2023 1213 by Horris Latino, RN Outcome: Progressing 06/20/2023 1213 by Horris Latino, RN Outcome: Progressing   Problem: Education: Goal: Understanding of CV disease, CV risk reduction, and recovery process will improve 06/20/2023  1213 by Horris Latino, RN Outcome: Progressing 06/20/2023 1213 by Horris Latino, RN Outcome: Progressing Goal: Individualized Educational Video(s) Outcome: Progressing   Problem: Activity: Goal: Ability to return to baseline activity level will improve Outcome: Progressing   Problem: Cardiovascular: Goal: Ability to achieve and maintain adequate cardiovascular perfusion will improve Outcome: Progressing Goal: Vascular access  site(s) Level 0-1 will be maintained Outcome: Progressing   Problem: Health Behavior/Discharge Planning: Goal: Ability to safely manage health-related needs after discharge will improve Outcome: Progressing

## 2023-06-20 NOTE — Interval H&P Note (Signed)
History and Physical Interval Note:  06/20/2023 4:46 PM  Robert Adams  has presented today for surgery, with the diagnosis of NSTEMI.  The various methods of treatment have been discussed with the patient and family. After consideration of risks, benefits and other options for treatment, the patient has consented to  Procedure(s): RIGHT/LEFT HEART CATH AND CORONARY ANGIOGRAPHY (N/A) as a surgical intervention.  The patient's history has been reviewed, patient examined, no change in status, stable for surgery.  I have reviewed the patient's chart and labs.  Questions were answered to the patient's satisfaction.     Read Bonelli

## 2023-06-20 NOTE — Progress Notes (Signed)
   Patient Name: Robert Adams Date of Encounter: 06/20/2023 [facility] Cardiologist: Nanetta Batty, MD   Interval Summary  .    No cardiovascular complaints.  Frustrated about cath delay.  Vital Signs .    Vitals:   06/20/23 0000 06/20/23 0410 06/20/23 0541 06/20/23 0755  BP: 124/61   125/66  Pulse: 62 (!) 53 (!) 46 60  Resp: 17  18 17   Temp: (!) 97.2 F (36.2 C) (!) 97.3 F (36.3 C)  (!) 97.4 F (36.3 C)  TempSrc:  Oral  Oral  SpO2: 97% 98% 100% 98%  Weight:   73.5 kg   Height:        Intake/Output Summary (Last 24 hours) at 06/20/2023 1102 Last data filed at 06/20/2023 0000 Gross per 24 hour  Intake 890.15 ml  Output 725 ml  Net 165.15 ml      06/20/2023    5:41 AM 06/19/2023    2:04 AM 06/18/2023    8:28 AM  Last 3 Weights  Weight (lbs) 162 lb 166 lb 0.1 oz 165 lb 5.5 oz  Weight (kg) 73.483 kg 75.3 kg 75 kg      Telemetry/ECG    AF, rates in low 100s - Personally Reviewed  Physical Exam .   GEN: No acute distress.   Neck: JVP ~8cm Cardiac: iRRR, no murmurs, rubs, or gallops.  Respiratory: Clear to auscultation bilaterally. GI: Soft, nontender, non-distended  MS: No edema; +2 radial pulses  Assessment & Plan .     ACS:  Trops downtrending.  Given new CM and elevated troponins, coronary angiography today.  Cont crestor 40  New cardiomyopathy:  EF 20-25%.  Cath today to characterize CM.  Continue Toprol 25mg  QPM, losartan 25mg  QPM (can consider Entresto as outpatient), start spironolactone 25 today.  Suspect CM due to AF.  Cor angio and RHC today.   AF:  Cont hep gtt.  Start Toprol as above.  Restart Eliquis after cath.  HL:  Crestor 40  HTN:  Well controlled, add spironolactone as above.  I have reviewed the risks, indications, and alternatives to cardiac catheterization, possible angioplasty, and stenting with the patient. Risks include but are not limited to bleeding, infection, vascular injury, stroke, myocardial infection, arrhythmia,  kidney injury, radiation-related injury in the case of prolonged fluoroscopy use, emergency cardiac surgery, and death. The patient understands the risks of serious complication is 1-2 in 1000 with diagnostic cardiac cath and 1-2% or less with angioplasty/stenting.      For questions or updates, please contact [facility] Please consult [email or web address] for contact info under        Signed, Orbie Pyo, MD

## 2023-06-20 NOTE — Progress Notes (Signed)
Cath lab notified that pt has a confirmed bed on 6E.

## 2023-06-20 NOTE — Progress Notes (Signed)
Heart Failure Nurse Navigator Progress Note  PCP: Wilhemina Bonito., MD PCP-Cardiologist: Verdie Drown to [facility] also Admission Diagnosis: Acute respiratory failure with hypoxia, COVID- 19, Nausea and vomiting Admitted from: [facility] via EMS)  Presentation:   Llana Aliment presented with shortness of breath x 2 weeks, cough, generalized abdominal discomfort with nausea and vomiting, tested positive at home for Covid on Saturday, symptoms progressively became worse. "Was seen at [facility] outpatient and prescribed prednisone taper and Augmentin. BNP 365.8, CXR showed Mild bibasilar atelectasis. ECHO showed new cardiomyopathy with EF 20-25%. Plan for right Heart Cath on 06/20/23.   Patient was educated on the sign and symptoms of heart failure, daily weights, when to  call his doctor or go tot he ED, Diet/ fluid restrictions, taking all medications as prescribed and attending all medical appointments. Patient verbalized his understanding, a HF TOC appointment was scheduled for 07/02/2023 @ 11 am.   ECHO/ LVEF: 20-25% New  Clinical Course:  Past Medical History:  Diagnosis Date   Allergy    seasonal   Anxiety    Arthritis    BPH (benign prostatic hyperplasia)    COPD (chronic obstructive pulmonary disease) (HCC)    Depression    GERD (gastroesophageal reflux disease)    Glaucoma    Hypertension    Sleep apnea      Social History   Socioeconomic History   Marital status: Married    Spouse name: Not on file   Number of children: Not on file   Years of education: Not on file   Highest education level: Not on file  Occupational History   Not on file  Tobacco Use   Smoking status: Former    Current packs/day: 0.00    Average packs/day: 1 pack/day for 30.0 years (30.0 ttl pk-yrs)    Types: Cigarettes    Start date: 06/04/1980    Quit date: 06/04/2010    Years since quitting: 13.0   Smokeless tobacco: Never  Vaping Use   Vaping status: Never Used  Substance and Sexual Activity    Alcohol use: No    Comment: rare   Drug use: No   Sexual activity: Not on file  Other Topics Concern   Not on file  Social History Narrative   Not on file   Social Drivers of Health   Financial Resource Strain: Not on file  Food Insecurity: No Food Insecurity (06/19/2023)   Hunger Vital Sign    Worried About Running Out of Food in the Last Year: Never true    Ran Out of Food in the Last Year: Never true  Transportation Needs: No Transportation Needs (06/19/2023)   PRAPARE - Administrator, Civil Service (Medical): No    Lack of Transportation (Non-Medical): No  Physical Activity: Not on file  Stress: Not on file  Social Connections: Unknown (06/19/2023)   Social Connection and Isolation Panel [NHANES]    Frequency of Communication with Friends and Family: Not on file    Frequency of Social Gatherings with Friends and Family: Not on file    Attends Religious Services: Not on file    Active Member of Clubs or Organizations: Not on file    Attends Banker Meetings: Not on file    Marital Status: Married   Education Assessment and Provision:  Detailed education and instructions provided on heart failure disease management including the following:  Signs and symptoms of Heart Failure When to call the physician Importance  of daily weights Low sodium diet Fluid restriction Medication management Anticipated future follow-up appointments  Patient education given on each of the above topics.  Patient acknowledges understanding via teach back method and acceptance of all instructions.  Education Materials:  "Living Better With Heart Failure" Booklet, HF zone tool, & Daily Weight Tracker Tool.  Patient has scale at home: Yes Patient has pill box at home: No    High Risk Criteria for Readmission and/or Poor Patient Outcomes: Heart failure hospital admissions (last 6 months): 0  No Show rate: 2 % Difficult social situation: No, lives with his  wife Demonstrates medication adherence: Yes Primary Language: English Literacy level: Reading, writing, and comprehension  Barriers of Care:   Diet/ fluid restrictions ( coke daily) Daily weights   Considerations/Referrals:   Referral made to Heart Failure Pharmacist Stewardship: Yes Referral made to Heart Failure CSW/NCM TOC: No Referral made to Heart & Vascular TOC clinic: Yes, 07/02/2023 @ 11 am.   Items for Follow-up on DC/TOC: Continued education on Heart failure Diet/ fluid restrictions ( daily coke) Daily weights   Rhae Hammock, BSN, RN Heart Failure Teacher, adult education Only

## 2023-06-21 ENCOUNTER — Other Ambulatory Visit (HOSPITAL_COMMUNITY): Payer: Self-pay

## 2023-06-21 DIAGNOSIS — R112 Nausea with vomiting, unspecified: Secondary | ICD-10-CM | POA: Diagnosis not present

## 2023-06-21 DIAGNOSIS — U071 COVID-19: Secondary | ICD-10-CM | POA: Diagnosis not present

## 2023-06-21 DIAGNOSIS — J9601 Acute respiratory failure with hypoxia: Secondary | ICD-10-CM | POA: Diagnosis not present

## 2023-06-21 LAB — CBC WITH DIFFERENTIAL/PLATELET
Abs Immature Granulocytes: 0.11 10*3/uL — ABNORMAL HIGH (ref 0.00–0.07)
Basophils Absolute: 0 10*3/uL (ref 0.0–0.1)
Basophils Relative: 0 %
Eosinophils Absolute: 0 10*3/uL (ref 0.0–0.5)
Eosinophils Relative: 0 %
HCT: 42.3 % (ref 39.0–52.0)
Hemoglobin: 14.3 g/dL (ref 13.0–17.0)
Immature Granulocytes: 1 %
Lymphocytes Relative: 6 %
Lymphs Abs: 0.8 10*3/uL (ref 0.7–4.0)
MCH: 29.5 pg (ref 26.0–34.0)
MCHC: 33.8 g/dL (ref 30.0–36.0)
MCV: 87.4 fL (ref 80.0–100.0)
Monocytes Absolute: 0.8 10*3/uL (ref 0.1–1.0)
Monocytes Relative: 7 %
Neutro Abs: 11 10*3/uL — ABNORMAL HIGH (ref 1.7–7.7)
Neutrophils Relative %: 86 %
Platelets: 324 10*3/uL (ref 150–400)
RBC: 4.84 MIL/uL (ref 4.22–5.81)
RDW: 13.8 % (ref 11.5–15.5)
WBC: 12.7 10*3/uL — ABNORMAL HIGH (ref 4.0–10.5)
nRBC: 0 % (ref 0.0–0.2)

## 2023-06-21 LAB — BASIC METABOLIC PANEL
Anion gap: 6 (ref 5–15)
BUN: 23 mg/dL (ref 8–23)
CO2: 26 mmol/L (ref 22–32)
Calcium: 8.9 mg/dL (ref 8.9–10.3)
Chloride: 105 mmol/L (ref 98–111)
Creatinine, Ser: 0.85 mg/dL (ref 0.61–1.24)
GFR, Estimated: >60 mL/min (ref >60)
Glucose, Bld: 98 mg/dL (ref 70–99)
Potassium: 4 mmol/L (ref 3.5–5.1)
Sodium: 137 mmol/L (ref 135–145)

## 2023-06-21 LAB — C-REACTIVE PROTEIN: CRP: 0.6 mg/dL (ref <1.0)

## 2023-06-21 LAB — MAGNESIUM: Magnesium: 2.1 mg/dL (ref 1.7–2.4)

## 2023-06-21 LAB — PROCALCITONIN: Procalcitonin: <0.1 ng/mL

## 2023-06-21 LAB — BRAIN NATRIURETIC PEPTIDE: B Natriuretic Peptide: 172.3 pg/mL — ABNORMAL HIGH (ref 0.0–100.0)

## 2023-06-21 MED ORDER — EMPAGLIFLOZIN 10 MG PO TABS: 10.0000 mg | ORAL_TABLET | Freq: Every day | ORAL | 0 refills | Status: DC

## 2023-06-21 MED ORDER — ROSUVASTATIN CALCIUM 40 MG PO TABS
40.0000 mg | ORAL_TABLET | Freq: Every day | ORAL | 3 refills | Status: DC
  Filled 2023-06-21: qty 30, 30d supply, fill #0

## 2023-06-21 MED ORDER — LOSARTAN POTASSIUM 25 MG PO TABS
25.0000 mg | ORAL_TABLET | Freq: Every evening | ORAL | 3 refills | Status: DC
  Filled 2023-06-21: qty 30, 30d supply, fill #0

## 2023-06-21 MED ORDER — BENZONATATE 100 MG PO CAPS
200.0000 mg | ORAL_CAPSULE | Freq: Three times a day (TID) | ORAL | Status: DC
  Filled 2023-06-21: qty 2

## 2023-06-21 MED ORDER — SPIRONOLACTONE 25 MG PO TABS: 12.5000 mg | ORAL_TABLET | Freq: Every day | ORAL | 0 refills | Status: DC

## 2023-06-21 MED ORDER — SPIRONOLACTONE 25 MG PO TABS
12.5000 mg | ORAL_TABLET | Freq: Every day | ORAL | 0 refills | Status: DC
  Filled 2023-06-21: qty 30, 60d supply, fill #0

## 2023-06-21 MED ORDER — EMPAGLIFLOZIN 10 MG PO TABS
10.0000 mg | ORAL_TABLET | Freq: Every day | ORAL | 0 refills | Status: DC
  Filled 2023-06-21: qty 30, 30d supply, fill #0

## 2023-06-21 MED ORDER — METOPROLOL SUCCINATE ER 25 MG PO TB24
25.0000 mg | ORAL_TABLET | Freq: Every evening | ORAL | 3 refills | Status: DC
  Filled 2023-06-21: qty 30, 30d supply, fill #0

## 2023-06-21 MED ORDER — BENZONATATE 200 MG PO CAPS
200.0000 mg | ORAL_CAPSULE | Freq: Three times a day (TID) | ORAL | 0 refills | Status: AC | PRN
  Filled 2023-06-21: qty 60, 20d supply, fill #0

## 2023-06-21 NOTE — Progress Notes (Signed)
Mobility Specialist Progress Note;   06/21/23 1055  Mobility  Activity Ambulated with assistance in hallway  Level of Assistance Standby assist, set-up cues, supervision of patient - no hands on  Assistive Device None  Distance Ambulated (ft) 200 ft  Activity Response Tolerated well  Mobility Referral Yes  Mobility visit 1 Mobility  Mobility Specialist Start Time (ACUTE ONLY) 1055  Mobility Specialist Stop Time (ACUTE ONLY) 1106  Mobility Specialist Time Calculation (min) (ACUTE ONLY) 11 min   Pt eager for mobility. Required no physical assistance during ambulation, SV. Took 1x standing rest break d/t SOB, however VSS. No other c/o during session. Returned pt safely back to chair with all needs met. RN notified.   Caesar Bookman Mobility Specialist Please contact via SecureChat or [employer] [phone]

## 2023-06-21 NOTE — Progress Notes (Signed)
   Heart Failure Stewardship Pharmacist Progress Note   PCP: Wilhemina Bonito., MD PCP-Cardiologist: Nanetta Batty, MD    HPI:  78 yo M with PMH hypertension, afib on Eliquis, COPD, remote tobacco use, BPH, and GERD. Presented to the ED with worsening SOB and cough. [facility], but symptoms did not improve. Tested positive for COVID. In the ED trop 353->659->80 and BNP 365->453->197. Previous cardiac cath in 2019 showed normal coronaries and ECHO with EF 50-55%, G1DD. ECHO on 1/14 showed EF reduced to 20-25%, global hypokinesis, mild concentric LVH, RV function mildly reduced, dilated inferior vena cava. Initial EKG with NSR, but telemetry on 1/14 with afib, rate low 100s. Received IV furosemide 20 mg x1 and was started on metoprolol XL. Due to bradycardia the night of 1/14, metoprolol was stopped.   R/LHC on 1/16 showed minimal nonobstructive CAD, RA 5, PA 25, wedge 16, CO/CI 5.7/2.9. Suspect COVID myocarditis vs HTN cardiomyopathy.   Patient reports feeling okay today. States that he still has shortness of breath but attributes this to COVID rather than fluid. Denies chest pain. No LEE. Reviewed discharge medications. Patient will receive medications from [facility] today and then continue using the [place] for future refills.   Discharge HF Medications: Beta Blocker: metoprolol XL 25 mg daily ACE/ARB/ARNI: losartan 25 mg daily  Prior to admission HF Medications: None - metoprolol tartrate 25 mg every 8 hrs as needed for afib HR>100 as long as SBP>100 - patient reported not taking  Pertinent Lab Values: Serum creatinine 0.85, BUN 23, Potassium 4.0, Sodium 137, BNP 197.8, Magnesium 2.1  Vital Signs: Weight: 163 lbs (admission weight: 165 lbs) Blood pressure: 120-140/70s  Heart rate: 60-80s I/O: net -0.2 L yesterday; net -0.6 L since admission  Medication Assistance / Insurance Benefits Check: Does the patient have prescription  insurance?  Yes Type of insurance plan: [facility] + [employer]  Outpatient Pharmacy:  [facility] Is the patient willing to use [facility] TOC pharmacy at discharge? Yes Is the patient willing to transition their outpatient pharmacy to utilize a [facility] outpatient pharmacy?   No    Assessment: 1. Acute on chronic systolic CHF (LVEF 20-25%), due to NICM. NYHA class II symptoms. - Appears euvolemic on exam. No diuretics indicated. - Continue losartan 25 mg daily and metoprolol XL 25 mg daily - Consider starting spironolactone and Jardiance 10 mg daily at follow up   Plan: 1) Medication changes recommended at this time: - Start Jardiance 10 mg daily at follow up  2) Patient assistance: - Patient gets all of his medications from the [place] at no cost  3)  Education  - Patient has been educated on current HF medications and potential additions to HF medication regimen - Patient verbalizes understanding that over the next few months, these medication doses may change and more medications may be added to optimize HF regimen - Patient has been educated on basic disease state pathophysiology and goals of therapy    Sharen Hones, PharmD, BCPS Heart Failure Stewardship Pharmacist Phone [phone]

## 2023-06-21 NOTE — Progress Notes (Signed)
   Patient Name: Robert Adams Date of Encounter: 06/21/2023 [facility] Cardiologist: Nanetta Batty, MD   Interval Summary  .    Cath yesterday with mild nonobstructive disease, RA 5, wedge 16, CO 5.7.  V gram with EF ~40%  Noted to be extremely hypertensive in lab.    Vital Signs .    Vitals:   06/20/23 1818 06/20/23 2023 06/20/23 2317 06/21/23 0600  BP: (!) 158/71 (!) 151/57 (!) 125/99 (!) 146/70  Pulse: 72  60 71  Resp: 20 18 20 18   Temp: 98.4 F (36.9 C) 97.8 F (36.6 C) 98 F (36.7 C) 98.2 F (36.8 C)  TempSrc: Oral Oral Oral Oral  SpO2:   94% 95%  Weight:    74 kg  Height:       No intake or output data in the 24 hours ending 06/21/23 1020     06/21/2023    6:00 AM 06/20/2023    5:41 AM 06/19/2023    2:04 AM  Last 3 Weights  Weight (lbs) 163 lb 1.6 oz 162 lb 166 lb 0.1 oz  Weight (kg) 73.982 kg 73.483 kg 75.3 kg      Telemetry/ECG    AF, rates in low 100s - Personally Reviewed  Physical Exam .   GEN: No acute distress.   Neck: JVP ~6cm Cardiac: iRRR, no murmurs, rubs, or gallops.  Respiratory: Clear to auscultation bilaterally. GI: Soft, nontender, non-distended  MS: No edema; +2 radial pulses  Assessment & Plan .     ACS:  Type II MI.  Cont Eliquis (instead of ASA) and Crestor 40mg   CAD:  Mild: Cont Eliquis (instead of ASA) and Crestor 40mg   NICM cardiomyopathy:  EF 20-25%.  Cath yesterday with mild disease.   Increase Toprol to 50mg .  Cont losartan (can change to [place]).  Start Jardiance 10mg  and sprionolactone 25mg  QPM.  Etiology either COVID myocarditis (improving) vs HTN vs AF.  AF:  Cont Eliquis, increase Toprol as above.  HL:  Continue crestor 40mg   HTN:  BP above goal 130/80;  increase Toprol to 50mg .  Continue losartan.  Cardiology will sign off; will arrange outpatient follow up.     For questions or updates, please contact [facility] Please consult [email or web address] for contact info under         Signed, Orbie Pyo, MD

## 2023-06-21 NOTE — Discharge Summary (Signed)
Physician Discharge Summary   Patient: Robert Adams MRN: [id number] DOB: 01/11/1946  Admit date:     06/18/2023  Discharge date: 06/21/23  Discharge Physician: Thad Ranger, MD    PCP: Wilhemina Bonito., MD   Recommendations at discharge:   Started on Toprol XL, losartan, spironolactone Started on Jardiance 10 mg daily  Discharge Diagnoses:    Acute respiratory failure with hypoxia (HCC)   COPD with acute exacerbation (HCC)   COVID-19   Elevated troponin   (HFpEF) heart failure with preserved ejection fraction (HCC)   Paroxysmal atrial fibrillation (HCC)   Chronic anticoagulation   Essential hypertension   Anxiety   BPH (benign prostatic hyperplasia)    Hospital Course:   78 y.o. male with medical history significant of hypertension COPD, remote tobacco abuse, BPH, and GERD presented to the hospital due to worsening respiratory symptoms. He reported a significant increase in shortness of breath with report of having a deep cough over the past three days.  The patient had been experiencing these symptoms for over the last week, but not to this extent.   The patient had been on a course of prednisone and Augmentin, prescribed approximately 9-10 days ago by the [place]. Despite this treatment, his symptoms have progressively worsened. The patient was diagnosed with COVID-19 two days ago, although he had been symptomatic for a longer period.   The patient has a past history of smoking but quit approximately 12 years ago. He does not typically require oxygen therapy. The patient's worsening respiratory symptoms, in the context of his recent COVID-19 diagnosis, necessitated hospital admission for further management and monitoring.  Assessment and Plan:   Acute respiratory failure with hypoxia - has been progressive over several months, mild decline over the last 3 to 4 days prior to admission - His presentation is consistent with acute on chronic systolic heart failure EF 20 to  25% which is a decline from his echocardiogram done 6 years ago with a EF of 55% -Currently stable, O2 sats 95% on room air, patient feels close to his baseline  Incidental COVID-19 viral illness -Mild, inflammatory markers relatively stable, still has some cough -Patient had been on Augmentin and prednisone prior to admission, received IV Solu-Medrol, last dose on 06/20/2023.  Antibiotics discontinued.  - Procalcitonin less than 0.1, CRP 0.6, improving, leukocytosis improving -Continue Tessalon Perles, Hycodan as needed for cough    NSTEMI,  acute on chronic systolic heart failure EF now down to 20% drop from 55% in 2019.   - Gradually progressive exertional shortness of breath for several months.  -Initially was placed on IV heparin drip, aspirin.  Placed on beta-blocker however 12 some bradycardia on the night of 06/18/2023.  TSH stable -Underwent cardiac cath which showed mild disease -Per cardiology, continue Eliquis, Crestor 40 mg daily, Toprol-XL, continue losartan, and Jardiance, spironolactone     Paroxysmal atrial fibrillation on chronic anticoagulation   -Currently in rate control, off of beta-blocker due to bradycardia on 06/18/2023,  -continue Eliquis.   Essential hypertension  -BP stable, continue Toprol, losartan, spironolactone -Cardiology will place on Entresto outpatient   Anxiety  -Continue Lexapro    BPH  -Continue finasteride and tamsulosin   Mild steroid-induced leukocytosis.    Last steroid dose 06/20/2023.     Pain control - [employer] Controlled Substance Reporting System database was reviewed. and patient was instructed, not to drive, operate heavy machinery, perform activities at heights, swimming or participation in water activities or provide baby-sitting services  while on Pain, Sleep and Anxiety Medications; until their outpatient Physician has advised to do so again. Also recommended to not to take more than prescribed Pain, Sleep and Anxiety  Medications.  Consultants: Cardiology Procedures performed: Cardiac cath Disposition: Home Diet recommendation:  Discharge Diet Orders (From admission, onward)     Start     Ordered   06/21/23 0000  Diet - low sodium heart healthy        06/21/23 1048            DISCHARGE MEDICATION: Allergies as of 06/21/2023       Reactions   Grass Extracts [gramineae Pollens]    Other reaction(s): Other (See Comments) Sneezing   Mirtazapine Rash   Penicillins Palpitations        Medication List     STOP taking these medications    amoxicillin-clavulanate 875-125 MG tablet Commonly known as: AUGMENTIN   metoprolol tartrate 25 MG tablet Commonly known as: LOPRESSOR   predniSONE 20 MG tablet Commonly known as: DELTASONE       TAKE these medications    albuterol 108 (90 Base) MCG/ACT inhaler Commonly known as: VENTOLIN HFA Inhale 2 puffs into the lungs every 6 (six) hours as needed for wheezing or shortness of breath.   apixaban 5 MG Tabs tablet Commonly known as: ELIQUIS Take 5 mg by mouth 2 (two) times daily.   augmented betamethasone dipropionate 0.05 % cream Commonly known as: DIPROLENE-AF Apply 1 Application topically in the morning and at bedtime.   benzonatate 200 MG capsule Commonly known as: TESSALON Take 1 capsule (200 mg total) by mouth 3 (three) times daily as needed for cough. What changed:  medication strength how much to take   cyanocobalamin 2000 MCG tablet Take 2,500 mcg by mouth as needed. 2,533mcg   empagliflozin 10 MG Tabs tablet Commonly known as: Jardiance Take 1 tablet (10 mg total) by mouth daily before breakfast.   escitalopram 10 MG tablet Commonly known as: LEXAPRO Take 10 mg by mouth daily.   finasteride 5 MG tablet Commonly known as: PROSCAR Take 5 mg by mouth every evening.   guaiFENesin-Codeine 100-6.33 MG/5ML Soln Take 5 mLs by mouth every 6 (six) hours as needed (cough).   losartan 25 MG tablet Commonly known as:  COZAAR Take 1 tablet (25 mg total) by mouth every evening.   metoprolol succinate 25 MG 24 hr tablet Commonly known as: TOPROL-XL Take 1 tablet (25 mg total) by mouth every evening.   PRESERVISION AREDS 2 PO Take 1 capsule by mouth daily.   rosuvastatin 40 MG tablet Commonly known as: CRESTOR Take 1 tablet (40 mg total) by mouth daily. Start taking on: June 22, 2023   spironolactone 25 MG tablet Commonly known as: Aldactone Take 0.5 tablets (12.5 mg total) by mouth daily.   Stiolto Respimat 2.5-2.5 MCG/ACT Aers Generic drug: Tiotropium Bromide-Olodaterol 2 each am What changed:  how much to take how to take this when to take this additional instructions   tamsulosin 0.4 MG Caps capsule Commonly known as: FLOMAX Take 0.4 mg by mouth at bedtime.   traZODone 50 MG tablet Commonly known as: DESYREL Take 50 mg by mouth at bedtime.        Follow-up Information     [facility]. Go in 11 day(s).   Specialty: Cardiology Why: Hospital follow up 07/02/2023 @ 11 am PLEASE bring a current medication list to appointment FREE valet parking, Entrance C, off [address]  Contact information: [address] [place] [address] [phone]        Wilhemina Bonito., MD. Schedule an appointment as soon as possible for a visit in 2 week(s).   Specialty: Internal Medicine Why: for hospital follow-up Contact information: [address] [place] [address] [phone]                Discharge Exam: Filed Weights   06/19/23 0204 06/20/23 0541 06/21/23 0600  Weight: 75.3 kg 73.5 kg 74 kg   S: No acute complaints, feels a lot better, wants to go home today.  Cleared by cardiology.   BP (!) 146/70 (BP Location: Left Arm)   Pulse 71   Temp 98.2 F (36.8 C) (Oral)   Resp 18   Ht 5' 10.5" (1.791 m)   Wt 74 kg   SpO2 95%   BMI 23.07 kg/m   Physical Exam General: Alert and oriented x 3,  NAD Cardiovascular: S1 S2 clear, RRR.  Respiratory: CTAB, no wheezing, rales or rhonchi Gastrointestinal: Soft, nontender, nondistended, NBS Ext: no pedal edema bilaterally Neuro: no new deficits Psych: Normal affect   Condition at discharge: fair  The results of significant diagnostics from this hospitalization (including imaging, microbiology, ancillary and laboratory) are listed below for reference.   Imaging Studies: CARDIAC CATHETERIZATION Result Date: 06/20/2023   Prox RCA lesion is 20% stenosed.   Prox Cx lesion is 30% stenosed.   Prox LAD to Mid LAD lesion is 30% stenosed. Findings: Ao = 183/74 (117) LV = 173/26 RA = 5 (v waves to 10) RV = 42/12 PA = 43/14 (25) PCW = 16 (v waves to 25) Fick cardiac output/index = 5.7/2.9 PVR = 1.6 WU Ao sat = 99% PA sat = 75%, 75% PAPi = 5.8 Assessment: 1. Minimal non-obstructive CAD 2. EF 40-45% by V-gram 3. Mildly elevated filling pressures with normal cardiac output 4. Severe HTN (given hydralzine in cath lab) Plan/Discussion: Suspect COVID myocarditis versus HTNive cardiomyopathy. Now improving. Can consider cMRI as needed. Titrate GDMT as tolerated. Arvilla Meres, MD 6:17 PM  DG Chest Port 1 View Result Date: 06/19/2023 CLINICAL DATA:  78 year old male with history of shortness of breath. EXAM: PORTABLE CHEST 1 VIEW COMPARISON:  Chest x-ray 06/18/2023. FINDINGS: Lung volumes are normal. No consolidative airspace disease. Mild scarring in the left lung base, unchanged. No pleural effusions. No pneumothorax. No pulmonary nodule or mass noted. Pulmonary vasculature and the cardiomediastinal silhouette are within normal limits. Atherosclerosis in the thoracic aorta. IMPRESSION: 1.  No radiographic evidence of acute cardiopulmonary disease. 2. Aortic atherosclerosis. Electronically Signed   By: Trudie Reed M.D.   On: 06/19/2023 07:02   ECHOCARDIOGRAM COMPLETE Result Date: 06/18/2023    ECHOCARDIOGRAM REPORT   Patient Name:   MANUS BERGTHOLD Date of  Exam: 06/18/2023 Medical Rec #:  [id number]      Height:       70.5 in Accession #:    [id number]     Weight:       165.3 lb Date of Birth:  April 20, 1946       BSA:          1.935 m Patient Age:    78 years       BP:           111/74 mmHg Patient Gender: M              HR:           110 bpm.  Exam Location:  Inpatient Procedure: 2D Echo, Cardiac Doppler and Color Doppler Indications:    Elevated troponins  History:        Patient has prior history of Echocardiogram examinations, most                 recent 06/24/2017. COPD, Arrythmias:Atrial Fibrillation; Risk                 Factors:Hypertension.  Sonographer:    Amy Chionchio Referring Phys: Madelyn Flavors, A IMPRESSIONS  1. Left ventricular ejection fraction, by estimation, is 20 to 25%. The left ventricle has severely decreased function. The left ventricle demonstrates global hypokinesis. There is mild concentric left ventricular hypertrophy. Left ventricular diastolic  parameters are indeterminate.  2. Right ventricular systolic function is mildly reduced. The right ventricular size is normal.  3. The mitral valve is grossly normal. No evidence of mitral valve regurgitation. No evidence of mitral stenosis.  4. The aortic valve was not well visualized. Aortic valve regurgitation is not visualized. No aortic stenosis is present.  5. The inferior vena cava is dilated in size with <50% respiratory variability, suggesting right atrial pressure of 15 mmHg. Comparison(s): LVEF is markedly worse from prior reporting. Reaching out to primary team. FINDINGS  Left Ventricle: Left ventricular ejection fraction, by estimation, is 20 to 25%. The left ventricle has severely decreased function. The left ventricle demonstrates global hypokinesis. The left ventricular internal cavity size was normal in size. There is mild concentric left ventricular hypertrophy. Left ventricular diastolic parameters are indeterminate. Right Ventricle: The right ventricular size is normal. No increase  in right ventricular wall thickness. Right ventricular systolic function is mildly reduced. Left Atrium: Left atrial size was normal in size. Right Atrium: Right atrial size was normal in size. Pericardium: There is no evidence of pericardial effusion. Mitral Valve: The mitral valve is grossly normal. No evidence of mitral valve regurgitation. No evidence of mitral valve stenosis. MV peak gradient, 3.6 mmHg. The mean mitral valve gradient is 2.0 mmHg. Tricuspid Valve: The tricuspid valve is normal in structure. Tricuspid valve regurgitation is not demonstrated. No evidence of tricuspid stenosis. Aortic Valve: The aortic valve was not well visualized. Aortic valve regurgitation is not visualized. No aortic stenosis is present. Aortic valve mean gradient measures 2.0 mmHg. Aortic valve peak gradient measures 3.5 mmHg. Aortic valve area, by VTI measures 2.78 cm. Pulmonic Valve: The pulmonic valve was not well visualized. Pulmonic valve regurgitation is not visualized. No evidence of pulmonic stenosis. Aorta: The aortic root and ascending aorta are structurally normal, with no evidence of dilitation. Venous: The inferior vena cava is dilated in size with less than 50% respiratory variability, suggesting right atrial pressure of 15 mmHg. IAS/Shunts: The interatrial septum was not well visualized.  LEFT VENTRICLE PLAX 2D LVIDd:         4.60 cm LVIDs:         4.30 cm LV PW:         1.10 cm LV IVS:        0.90 cm LVOT diam:     2.10 cm LV SV:         35 LV SV Index:   18 LVOT Area:     3.46 cm  RIGHT VENTRICLE          IVC RV Basal diam:  3.30 cm  IVC diam: 2.20 cm TAPSE (M-mode): 1.1 cm RIGHT ATRIUM           Index RA Area:  18.00 cm RA Volume:   50.50 ml  26.09 ml/m  AORTIC VALVE AV Area (Vmax):    2.81 cm AV Area (Vmean):   2.69 cm AV Area (VTI):     2.78 cm AV Vmax:           93.40 cm/s AV Vmean:          64.200 cm/s AV VTI:            0.128 m AV Peak Grad:      3.5 mmHg AV Mean Grad:      2.0 mmHg LVOT Vmax:          75.83 cm/s LVOT Vmean:        49.867 cm/s LVOT VTI:          0.102 m LVOT/AV VTI ratio: 0.80  AORTA Ao Root diam: 3.10 cm Ao Asc diam:  3.30 cm MITRAL VALVE             TRICUSPID VALVE MV Area VTI:  2.19 cm   TR Peak grad:   17.5 mmHg MV Peak grad: 3.6 mmHg   TR Vmax:        209.00 cm/s MV Mean grad: 2.0 mmHg MV Vmax:      0.95 m/s   SHUNTS MV Vmean:     57.9 cm/s  Systemic VTI:  0.10 m                          Systemic Diam: 2.10 cm Riley Lam MD Electronically signed by Riley Lam MD Signature Date/Time: 06/18/2023/4:45:57 PM    Final    DG Chest 2 View Result Date: 06/18/2023 CLINICAL DATA:  Shortness of breath, chest congestion EXAM: CHEST - 2 VIEW COMPARISON:  09/12/2022 FINDINGS: Mild bibasilar atelectasis. No focal consolidation. No pleural effusion or pneumothorax. Heart is normal in size.  Thoracic aortic atherosclerosis. IMPRESSION: Mild bibasilar atelectasis. No acute cardiopulmonary disease. Electronically Signed   By: Charline Bills M.D.   On: 06/18/2023 03:33    Microbiology: Results for orders placed or performed during the hospital encounter of 06/18/23  Resp panel by RT-PCR (RSV, Flu A&B, Covid) Anterior Nasal Swab     Status: Abnormal   Collection Time: 06/18/23  3:05 AM   Specimen: Anterior Nasal Swab  Result Value Ref Range Status   SARS Coronavirus 2 by RT PCR POSITIVE (A) NEGATIVE Final   Influenza A by PCR NEGATIVE NEGATIVE Final   Influenza B by PCR NEGATIVE NEGATIVE Final    Comment: (NOTE) The Xpert Xpress SARS-CoV-2/FLU/RSV plus assay is intended as an aid in the diagnosis of influenza from Nasopharyngeal swab specimens and should not be used as a sole basis for treatment. Nasal washings and aspirates are unacceptable for Xpert Xpress SARS-CoV-2/FLU/RSV testing.  Fact Sheet for Patients: BloggerCourse.com  Fact Sheet for Healthcare Providers: SeriousBroker.it  This test is not yet  approved or cleared by the [place] FDA and has been authorized for detection and/or diagnosis of SARS-CoV-2 by FDA under an Emergency Use Authorization (EUA). This EUA will remain in effect (meaning this test can be used) for the duration of the COVID-19 declaration under Section 564(b)(1) of the Act, 21 U.S.C. section 360bbb-3(b)(1), unless the authorization is terminated or revoked.     Resp Syncytial Virus by PCR NEGATIVE NEGATIVE Final    Comment: (NOTE) Fact Sheet for Patients: BloggerCourse.com  Fact Sheet for Healthcare Providers: SeriousBroker.it  This test is not yet approved or cleared  by the [place] and has been authorized for detection and/or diagnosis of SARS-CoV-2 by FDA under an Emergency Use Authorization (EUA). This EUA will remain in effect (meaning this test can be used) for the duration of the COVID-19 declaration under Section 564(b)(1) of the Act, 21 U.S.C. section 360bbb-3(b)(1), unless the authorization is terminated or revoked.  Performed at [facility], [address] [place], [place] [address]     Labs: CBC: Recent Labs  Lab 06/18/23 0305 06/19/23 0449 06/20/23 0534 06/20/23 1716 06/20/23 1723 06/21/23 0528  WBC 13.0* 8.9 14.9*  --   --  12.7*  NEUTROABS 10.6*  --  12.8*  --   --  11.0*  HGB 14.6 14.9 13.9 11.2* 12.9*  13.3 14.3  HCT 43.6 43.8 40.4 33.0* 38.0*  39.0 42.3  MCV 88.4 86.9 86.3  --   --  87.4  PLT 313 305 289  --   --  324   Basic Metabolic Panel: Recent Labs  Lab 06/18/23 0305 06/19/23 0044 06/19/23 0449 06/19/23 0815 06/20/23 0534 06/20/23 1716 06/20/23 1723 06/21/23 0528  NA 135  --  133*  --  133* 144 138  138 137  K 4.2  --  4.8  --  4.1 3.3* 4.1  4.2 4.0  CL 104  --  100  --  102  --   --  105  CO2 19*  --  22  --  23  --   --  26  GLUCOSE 97  --  121*  --  146*  --   --  98  BUN 15  --  15  --  28*  --   --  23  CREATININE 0.87  --   0.99  --  0.98  --   --  0.85  CALCIUM 8.9  --  8.6*  --  8.3*  --   --  8.9  MG  --  1.7  --   --  2.2  --   --  2.1  PHOS  --   --   --  4.2  --   --   --   --    Liver Function Tests: Recent Labs  Lab 06/19/23 0048  AST 33  ALT 27  ALKPHOS 62  BILITOT 0.8  PROT 5.6*  ALBUMIN 3.0*   CBG: No results for input(s): "GLUCAP" in the last 168 hours.  Discharge time spent: greater than 30 minutes.  Signed: Thad Ranger, MD [facility] 06/21/2023

## 2023-06-21 NOTE — Plan of Care (Signed)
  Problem: Education: Goal: Knowledge of risk factors and measures for prevention of condition will improve Outcome: Adequate for Discharge   Problem: Coping: Goal: Psychosocial and spiritual needs will be supported Outcome: Adequate for Discharge   Problem: Respiratory: Goal: Will maintain a patent airway Outcome: Adequate for Discharge Goal: Complications related to the disease process, condition or treatment will be avoided or minimized Outcome: Adequate for Discharge   Problem: Education: Goal: Knowledge of General Education information will improve Description: Including pain rating scale, medication(s)/side effects and non-pharmacologic comfort measures Outcome: Adequate for Discharge   Problem: Health Behavior/Discharge Planning: Goal: Ability to manage health-related needs will improve Outcome: Adequate for Discharge   Problem: Clinical Measurements: Goal: Ability to maintain clinical measurements within normal limits will improve Outcome: Adequate for Discharge Goal: Will remain free from infection Outcome: Adequate for Discharge Goal: Diagnostic test results will improve Outcome: Adequate for Discharge Goal: Respiratory complications will improve Outcome: Adequate for Discharge Goal: Cardiovascular complication will be avoided Outcome: Adequate for Discharge   Problem: Activity: Goal: Risk for activity intolerance will decrease Outcome: Adequate for Discharge   Problem: Nutrition: Goal: Adequate nutrition will be maintained Outcome: Adequate for Discharge   Problem: Coping: Goal: Level of anxiety will decrease Outcome: Adequate for Discharge   Problem: Elimination: Goal: Will not experience complications related to bowel motility Outcome: Adequate for Discharge Goal: Will not experience complications related to urinary retention Outcome: Adequate for Discharge   Problem: Pain Management: Goal: General experience of comfort will improve Outcome:  Adequate for Discharge   Problem: Safety: Goal: Ability to remain free from injury will improve Outcome: Adequate for Discharge   Problem: Skin Integrity: Goal: Risk for impaired skin integrity will decrease Outcome: Adequate for Discharge   Problem: Education: Goal: Understanding of CV disease, CV risk reduction, and recovery process will improve Outcome: Adequate for Discharge Goal: Individualized Educational Video(s) Outcome: Adequate for Discharge   Problem: Activity: Goal: Ability to return to baseline activity level will improve Outcome: Adequate for Discharge   Problem: Cardiovascular: Goal: Ability to achieve and maintain adequate cardiovascular perfusion will improve Outcome: Adequate for Discharge Goal: Vascular access site(s) Level 0-1 will be maintained Outcome: Adequate for Discharge   Problem: Health Behavior/Discharge Planning: Goal: Ability to safely manage health-related needs after discharge will improve Outcome: Adequate for Discharge

## 2023-07-02 ENCOUNTER — Encounter (HOSPITAL_COMMUNITY): Payer: PPO

## 2023-07-02 ENCOUNTER — Ambulatory Visit: Payer: No Typology Code available for payment source | Admitting: Internal Medicine

## 2023-07-11 ENCOUNTER — Ambulatory Visit: Payer: PPO | Admitting: General Practice

## 2023-08-23 ENCOUNTER — Ambulatory Visit: Payer: PPO | Admitting: Internal Medicine

## 2023-09-04 DIAGNOSIS — I4891 Unspecified atrial fibrillation: Secondary | ICD-10-CM | POA: Diagnosis not present

## 2024-03-31 ENCOUNTER — Telehealth (HOSPITAL_COMMUNITY): Payer: Self-pay

## 2024-03-31 NOTE — Telephone Encounter (Signed)
 Outside/paper referral received by Dr. Tor from [place].   [id number]  Attempted to call patient regarding interest in pulmonary rehab- no answer, left message. Sent MyChart message.

## 2024-04-02 ENCOUNTER — Telehealth (HOSPITAL_COMMUNITY): Payer: Self-pay

## 2024-04-02 NOTE — Telephone Encounter (Signed)
 Patient's wife left message asking us  to call her number regarding pulmonary rehab (DPR on file). Called patient's wife Orie, she then asked for us  to call her husband on his home number. Attempted to call patient at home number- no answer, left message.

## 2024-04-03 ENCOUNTER — Telehealth (HOSPITAL_COMMUNITY): Payer: Self-pay

## 2024-04-03 NOTE — Telephone Encounter (Signed)
 Called patient to see if he was interested in participating in the Pulmonary Rehab Program. Patient will come in for orientation on 04/10/24 and will attend the 10:15 exercise class.  Pensions consultant.

## 2024-04-08 ENCOUNTER — Other Ambulatory Visit (HOSPITAL_COMMUNITY): Payer: Self-pay

## 2024-04-09 ENCOUNTER — Telehealth (HOSPITAL_COMMUNITY): Payer: Self-pay

## 2024-04-09 NOTE — Telephone Encounter (Signed)
 Called pt to confirm his PR appointment for tomorrow. No answer. Unable to leave VM.

## 2024-04-10 ENCOUNTER — Encounter (HOSPITAL_COMMUNITY)
Admission: RE | Admit: 2024-04-10 | Discharge: 2024-04-10 | Disposition: A | Discharge status: Home / Self Care | Source: Ambulatory Visit | Attending: Pulmonary Disease | Admitting: Pulmonary Disease

## 2024-04-10 ENCOUNTER — Encounter (HOSPITAL_COMMUNITY): Payer: Self-pay

## 2024-04-10 VITALS — Ht 70.0 in | Wt 159.8 lb

## 2024-04-10 DIAGNOSIS — J449 Chronic obstructive pulmonary disease, unspecified: Secondary | ICD-10-CM | POA: Insufficient documentation

## 2024-04-10 NOTE — Progress Notes (Signed)
 Pulmonary Individual Treatment Plan  Patient Details  Name: Robert Adams MRN: [id number] Date of Birth: 04-01-1946 Referring Provider:   [name] Pulmonary Rehab Walk Test from 04/10/2024 in [facility]  Referring Provider [name]    Initial Encounter Date:  Flowsheet Row Pulmonary Rehab Walk Test from 04/10/2024 in [facility]  Date 04/10/24    Visit Diagnosis: Chronic obstructive pulmonary disease, unspecified COPD type (HCC)  Patient's Home Medications on Admission:   Current Outpatient Medications:    albuterol  (PROVENTIL  HFA;VENTOLIN  HFA) 108 (90 Base) MCG/ACT inhaler, Inhale 2 puffs into the lungs every 6 (six) hours as needed for wheezing or shortness of breath. , Disp: , Rfl:    apixaban  (ELIQUIS ) 5 MG TABS tablet, Take 5 mg by mouth 2 (two) times daily., Disp: , Rfl:    augmented betamethasone dipropionate (DIPROLENE-AF) 0.05 % cream, Apply 1 Application topically in the morning and at bedtime., Disp: , Rfl:    benzonatate  (TESSALON ) 200 MG capsule, Take 1 capsule (200 mg total) by mouth 3 (three) times daily as needed for cough., Disp: 60 capsule, Rfl: 0   cyanocobalamin 2000 MCG tablet, Take 2,500 mcg by mouth as needed. 2,535mcg, Disp: , Rfl:    empagliflozin  (JARDIANCE ) 10 MG TABS tablet, Take 1 tablet (10 mg total) by mouth daily before breakfast., Disp: 30 tablet, Rfl: 0   escitalopram  (LEXAPRO ) 10 MG tablet, Take 10 mg by mouth daily., Disp: , Rfl:    finasteride  (PROSCAR ) 5 MG tablet, Take 5 mg by mouth every evening., Disp: , Rfl:    guaiFENesin -Codeine  100-6.33 MG/5ML SOLN, Take 5 mLs by mouth every 6 (six) hours as needed (cough)., Disp: , Rfl:    losartan  (COZAAR ) 25 MG tablet, Take 1 tablet (25 mg total) by mouth every evening., Disp: 30 tablet, Rfl: 3   metoprolol  succinate (TOPROL -XL) 25 MG 24 hr tablet, Take 1 tablet (25 mg total) by mouth  every evening., Disp: 30 tablet, Rfl: 3   Multiple Vitamins-Minerals (PRESERVISION AREDS 2 PO), Take 1 capsule by mouth daily., Disp: , Rfl:    PACERONE 200 MG tablet, Take 200 mg by mouth daily., Disp: , Rfl:    rosuvastatin  (CRESTOR ) 40 MG tablet, Take 1 tablet (40 mg total) by mouth daily., Disp: 30 tablet, Rfl: 3   spironolactone  (ALDACTONE ) 25 MG tablet, Take 0.5 tablets (12.5 mg total) by mouth daily., Disp: 30 tablet, Rfl: 0   Tamsulosin  HCl (FLOMAX ) 0.4 MG CAPS, Take 0.4 mg by mouth at bedtime., Disp: , Rfl:    Tiotropium Bromide-Olodaterol (STIOLTO RESPIMAT ) 2.5-2.5 MCG/ACT AERS, 2 each am (Patient taking differently: Inhale 2 puffs into the lungs daily.), Disp: 1 each, Rfl: 11   traZODone  (DESYREL ) 50 MG tablet, Take 50 mg by mouth at bedtime., Disp: , Rfl:   Past Medical History: Past Medical History:  Diagnosis Date   Allergy    seasonal   Anxiety    Arthritis    BPH (benign prostatic hyperplasia)    COPD (chronic obstructive pulmonary disease) (HCC)    Depression    GERD (gastroesophageal reflux disease)    Glaucoma    Hypertension    Sleep apnea     Tobacco Use: Social History   Tobacco Use  Smoking Status Former   Current packs/day: 0.00   Average packs/day: 1 pack/day for 30.0 years (30.0 ttl pk-yrs)   Types: Cigarettes   Start date: 06/04/1980  Quit date: 06/04/2010   Years since quitting: 13.8  Smokeless Tobacco Never    Labs: Review Flowsheet       Latest Ref Rng & Units 08/23/2017 06/20/2023  Labs for ITP Cardiac and Pulmonary Rehab  Cholestrol 0 - 200 mg/dL 833  869   LDL (calc) 0 - 99 mg/dL 92  74   HDL-C >59 mg/dL 60  45   Trlycerides <849 mg/dL 68  55   PH, Arterial 2.64 - 7.45 - 7.445   PCO2 arterial 32 - 48 mmHg - 27.4   Bicarbonate 20.0 - 28.0 mmol/L 20.0 - 28.0 mmol/L - 24.1  23.4  18.8   TCO2 22 - 32 mmol/L 22 - 32 mmol/L - 25  25  20    Acid-base deficit 0.0 - 2.0 mmol/L - 1.0  4.0   O2 Saturation % % - 75  75  99     Details        Multiple values from one day are sorted in reverse-chronological order         Capillary Blood Glucose: Lab Results  Component Value Date   GLUCAP 96 08/15/2022     Pulmonary Assessment Scores:  Pulmonary Assessment Scores     Row Name 04/10/24 1314         ADL UCSD   ADL Phase Entry     SOB Score total 48       CAT Score   CAT Score 18       mMRC Score   mMRC Score 4       UCSD: Self-administered rating of dyspnea associated with activities of daily living (ADLs) 6-point scale (0 = not at all to 5 = maximal or unable to do because of breathlessness)  Scoring Scores range from 0 to 120.  Minimally important difference is 5 units  CAT: CAT can identify the health impairment of COPD patients and is better correlated with disease progression.  CAT has a scoring range of zero to 40. The CAT score is classified into four groups of low (less than 10), medium (10 - 20), high (21-30) and very high (31-40) based on the impact level of disease on health status. A CAT score over 10 suggests significant symptoms.  A worsening CAT score could be explained by an exacerbation, poor medication adherence, poor inhaler technique, or progression of COPD or comorbid conditions.  CAT MCID is 2 points  mMRC: mMRC (Modified Medical Research Council) Dyspnea Scale is used to assess the degree of baseline functional disability in patients of respiratory disease due to dyspnea. No minimal important difference is established. A decrease in score of 1 point or greater is considered a positive change.   Pulmonary Function Assessment:  Pulmonary Function Assessment - 04/10/24 1327       Breath   Shortness of Breath Yes;Limiting activity          Exercise Target Goals: Exercise Program Goal: Individual exercise prescription set using results from initial 6 min walk test and THRR while considering  patient's activity barriers and safety.   Exercise Prescription Goal: Initial exercise  prescription builds to 30-45 minutes a day of aerobic activity, 2-3 days per week.  Home exercise guidelines will be given to patient during program as part of exercise prescription that the participant will acknowledge.  Activity Barriers & Risk Stratification:  Activity Barriers & Cardiac Risk Stratification - 04/10/24 1328       Activity Barriers & Cardiac Risk Stratification   Activity Barriers  Deconditioning;Muscular Weakness;Shortness of Breath;Arthritis          6 Minute Walk:  6 Minute Walk     Row Name 04/10/24 1422         6 Minute Walk   Phase Initial     Distance 1085 feet     Walk Time 6 minutes     # of Rest Breaks 1  4:31-5:00     MPH 2.05     METS 2.34     RPE 14     Perceived Dyspnea  3     VO2 Peak 8.2     Symptoms No     Resting HR 65 bpm     Resting BP 114/62     Resting Oxygen  Saturation  97 %     Exercise Oxygen  Saturation  during 6 min walk 84 %     Max Ex. HR 92 bpm     Max Ex. BP 146/62     2 Minute Post BP 122/60       Interval HR   1 Minute HR 81     2 Minute HR 92     3 Minute HR 82     4 Minute HR 90     5 Minute HR 92     6 Minute HR 92     2 Minute Post HR 79     Interval Heart Rate? Yes       Interval Oxygen    Interval Oxygen ? Yes     Baseline Oxygen  Saturation % 97 %     1 Minute Oxygen  Saturation % 95 %     1 Minute Liters of Oxygen  0 L     2 Minute Oxygen  Saturation % 84 %     2 Minute Liters of Oxygen  0 L  increased to 2     3 Minute Oxygen  Saturation % 98 %     3 Minute Liters of Oxygen  2 L     4 Minute Oxygen  Saturation % 91 %     4 Minute Liters of Oxygen  2 L     5 Minute Oxygen  Saturation % 90 %     5 Minute Liters of Oxygen  2 L     6 Minute Oxygen  Saturation % 92 %     6 Minute Liters of Oxygen  2 L     2 Minute Post Oxygen  Saturation % 95 %     2 Minute Post Liters of Oxygen  2 L        Oxygen  Initial Assessment:  Oxygen  Initial Assessment - 04/10/24 1324       Home Oxygen    Home Oxygen  Device  E-Tanks;Home Concentrator    Sleep Oxygen  Prescription None    Home Exercise Oxygen  Prescription Continuous    Liters per minute 2    Home Resting Oxygen  Prescription None    Compliance with Home Oxygen  Use Yes      Initial 6 min Walk   Oxygen  Used None      Program Oxygen  Prescription   Program Oxygen  Prescription None      Intervention   Short Term Goals To learn and exhibit compliance with exercise, home and travel O2 prescription;To learn and understand importance of maintaining oxygen  saturations>88%;To learn and demonstrate proper use of respiratory medications;To learn and understand importance of monitoring SPO2 with pulse oximeter and demonstrate accurate use of the pulse oximeter.;To learn and demonstrate proper pursed lip breathing techniques or other breathing techniques.  Long  Term Goals Exhibits compliance with exercise, home  and travel O2 prescription;Maintenance of O2 saturations>88%;Compliance with respiratory medication;Verbalizes importance of monitoring SPO2 with pulse oximeter and return demonstration;Exhibits proper breathing techniques, such as pursed lip breathing or other method taught during program session          Oxygen  Re-Evaluation:   Oxygen  Discharge (Final Oxygen  Re-Evaluation):   Initial Exercise Prescription:  Initial Exercise Prescription - 04/10/24 1400       Date of Initial Exercise RX and Referring Provider   Date 04/10/24    Referring Provider [name]    Expected Discharge Date 07/16/24      Oxygen    Oxygen  Continuous    Liters 2    Maintain Oxygen  Saturation 88% or higher      Recumbant Elliptical   Level 1    RPM 58    Watts 82    Minutes 15    METs 3.52      Track   Minutes 15    METs 2      Prescription Details   Frequency (times per week) 2    Duration Progress to 30 minutes of continuous aerobic without signs/symptoms of physical distress      Intensity   THRR 40-80% of Max Heartrate 56-113    Ratings of  Perceived Exertion 11-13    Perceived Dyspnea 0-4      Progression   Progression Continue to progress workloads to maintain intensity without signs/symptoms of physical distress.      Resistance Training   Training Prescription Yes    Weight blue bands    Reps 10-15          Perform Capillary Blood Glucose checks as needed.  Exercise Prescription Changes:   Exercise Comments:   Exercise Goals and Review:   Exercise Goals     Row Name 04/10/24 1329             Exercise Goals   Increase Physical Activity Yes       Intervention Provide advice, education, support and counseling about physical activity/exercise needs.;Develop an individualized exercise prescription for aerobic and resistive training based on initial evaluation findings, risk stratification, comorbidities and participant's personal goals.       Expected Outcomes Short Term: Attend rehab on a regular basis to increase amount of physical activity.;Long Term: Add in home exercise to make exercise part of routine and to increase amount of physical activity.;Long Term: Exercising regularly at least 3-5 days a week.       Increase Strength and Stamina Yes       Intervention Provide advice, education, support and counseling about physical activity/exercise needs.;Develop an individualized exercise prescription for aerobic and resistive training based on initial evaluation findings, risk stratification, comorbidities and participant's personal goals.       Expected Outcomes Short Term: Increase workloads from initial exercise prescription for resistance, speed, and METs.;Short Term: Perform resistance training exercises routinely during rehab and add in resistance training at home;Long Term: Improve cardiorespiratory fitness, muscular endurance and strength as measured by increased METs and functional capacity ( )       Able to understand and use rate of perceived exertion (RPE) scale Yes       Intervention Provide  education and explanation on how to use RPE scale       Expected Outcomes Short Term: Able to use RPE daily in rehab to express subjective intensity level;Long Term:  Able to use RPE to guide intensity level when exercising  independently       Able to understand and use Dyspnea scale Yes       Intervention Provide education and explanation on how to use Dyspnea scale       Expected Outcomes Short Term: Able to use Dyspnea scale daily in rehab to express subjective sense of shortness of breath during exertion;Long Term: Able to use Dyspnea scale to guide intensity level when exercising independently       Knowledge and understanding of Target Heart Rate Range (THRR) Yes       Intervention Provide education and explanation of THRR including how the numbers were predicted and where they are located for reference       Expected Outcomes Short Term: Able to state/look up THRR;Long Term: Able to use THRR to govern intensity when exercising independently;Short Term: Able to use daily as guideline for intensity in rehab       Understanding of Exercise Prescription Yes       Intervention Provide education, explanation, and written materials on patient's individual exercise prescription       Expected Outcomes Short Term: Able to explain program exercise prescription;Long Term: Able to explain home exercise prescription to exercise independently          Exercise Goals Re-Evaluation :   Discharge Exercise Prescription (Final Exercise Prescription Changes):   Nutrition:  Target Goals: Understanding of nutrition guidelines, daily intake of sodium 1500mg , cholesterol 200mg , calories 30% from fat and 7% or less from saturated fats, daily to have 5 or more servings of fruits and vegetables.  Biometrics:    Nutrition Therapy Plan and Nutrition Goals:   Nutrition Assessments:  MEDIFICTS Score Key: >=70 Need to make dietary changes  40-70 Heart Healthy Diet <= 40 Therapeutic Level Cholesterol  Diet   Picture Your Plate Scores: <59 Unhealthy dietary pattern with much room for improvement. 41-50 Dietary pattern unlikely to meet recommendations for good health and room for improvement. 51-60 More healthful dietary pattern, with some room for improvement.  >60 Healthy dietary pattern, although there may be some specific behaviors that could be improved.    Nutrition Goals Re-Evaluation:   Nutrition Goals Discharge (Final Nutrition Goals Re-Evaluation):   Psychosocial: Target Goals: Acknowledge presence or absence of significant depression and/or stress, maximize coping skills, provide positive support system. Participant is able to verbalize types and ability to use techniques and skills needed for reducing stress and depression.  Initial Review & Psychosocial Screening:  Initial Psych Review & Screening - 04/10/24 1321       Initial Review   Current issues with Current Psychotropic Meds;Current Anxiety/Panic    Comments Pt taking medication for anxiety. He feels this benefits him.      Family Dynamics   Good Support System? Yes    Comments Spouse, son, and daughter      Barriers   Psychosocial barriers to participate in program There are no identifiable barriers or psychosocial needs.      Screening Interventions   Interventions Encouraged to exercise          Quality of Life Scores:  Scores of 19 and below usually indicate a poorer quality of life in these areas.  A difference of  2-3 points is a clinically meaningful difference.  A difference of 2-3 points in the total score of the Quality of Life Index has been associated with significant improvement in overall quality of life, self-image, physical symptoms, and general health in studies assessing change in quality of life.  PHQ-9: Review Flowsheet       04/10/2024  Depression screen PHQ 2/9  Decreased Interest 1  Down, Depressed, Hopeless 1  PHQ - 2 Score 2  Altered sleeping 0  Tired, decreased energy  0  Change in appetite 0  Feeling bad or failure about yourself  0  Trouble concentrating 0  Moving slowly or fidgety/restless 0  Suicidal thoughts 0  PHQ-9 Score 2  Difficult doing work/chores Somewhat difficult   Interpretation of Total Score  Total Score Depression Severity:  1-4 = Minimal depression, 5-9 = Mild depression, 10-14 = Moderate depression, 15-19 = Moderately severe depression, 20-27 = Severe depression   Psychosocial Evaluation and Intervention:  Psychosocial Evaluation - 04/10/24 1323       Psychosocial Evaluation & Interventions   Interventions Encouraged to exercise with the program and follow exercise prescription    Comments Merrick denies any psychosocial barriers at this time.    Expected Outcomes For Trindon to participate in rehab free of concerns.    Continue Psychosocial Services  No Follow up required          Psychosocial Re-Evaluation:   Psychosocial Discharge (Final Psychosocial Re-Evaluation):   Education: Education Goals: Education classes will be provided on a weekly basis, covering required topics. Participant will state understanding/return demonstration of topics presented.  Learning Barriers/Preferences:  Learning Barriers/Preferences - 04/10/24 1324       Learning Barriers/Preferences   Learning Barriers Sight    Learning Preferences Skilled Demonstration          Education Topics: Know Your Numbers Group instruction that is supported by a PowerPoint presentation. Instructor discusses importance of knowing and understanding resting, exercise, and post-exercise oxygen  saturation, heart rate, and blood pressure. Oxygen  saturation, heart rate, blood pressure, rating of perceived exertion, and dyspnea are reviewed along with a normal range for these values.    Exercise for the Pulmonary Patient Group instruction that is supported by a PowerPoint presentation. Instructor discusses benefits of exercise, core components of exercise,  frequency, duration, and intensity of an exercise routine, importance of utilizing pulse oximetry during exercise, safety while exercising, and options of places to exercise outside of rehab.    MET Level  Group instruction provided by PowerPoint, verbal discussion, and written material to support subject matter. Instructor reviews what METs are and how to increase METs.    Pulmonary Medications Verbally interactive group education provided by instructor with focus on inhaled medications and proper administration.   Anatomy and Physiology of the Respiratory System Group instruction provided by PowerPoint, verbal discussion, and written material to support subject matter. Instructor reviews respiratory cycle and anatomical components of the respiratory system and their functions. Instructor also reviews differences in obstructive and restrictive respiratory diseases with examples of each.    Oxygen  Safety Group instruction provided by PowerPoint, verbal discussion, and written material to support subject matter. There is an overview of "What is Oxygen " and "Why do we need it".  Instructor also reviews how to create a safe environment for oxygen  use, the importance of using oxygen  as prescribed, and the risks of noncompliance. There is a brief discussion on traveling with oxygen  and resources the patient may utilize.   Oxygen  Use Group instruction provided by PowerPoint, verbal discussion, and written material to discuss how supplemental oxygen  is prescribed and different types of oxygen  supply systems. Resources for more information are provided.    Breathing Techniques Group instruction that is supported by demonstration and informational handouts. Instructor discusses the benefits of  pursed lip and diaphragmatic breathing and detailed demonstration on how to perform both.     Risk Factor Reduction Group instruction that is supported by a PowerPoint presentation. Instructor discusses the  definition of a risk factor, different risk factors for pulmonary disease, and how the heart and lungs work together.   Pulmonary Diseases Group instruction provided by PowerPoint, verbal discussion, and written material to support subject matter. Instructor gives an overview of the different type of pulmonary diseases. There is also a discussion on risk factors and symptoms as well as ways to manage the diseases.   Stress and Energy Conservation Group instruction provided by PowerPoint, verbal discussion, and written material to support subject matter. Instructor gives an overview of stress and the impact it can have on the body. Instructor also reviews ways to reduce stress. There is also a discussion on energy conservation and ways to conserve energy throughout the day.   Warning Signs and Symptoms Group instruction provided by PowerPoint, verbal discussion, and written material to support subject matter. Instructor reviews warning signs and symptoms of stroke, heart attack, cold and flu. Instructor also reviews ways to prevent the spread of infection.   Other Education Group or individual verbal, written, or video instructions that support the educational goals of the pulmonary rehab program.    Knowledge Questionnaire Score:  Knowledge Questionnaire Score - 04/10/24 1313       Knowledge Questionnaire Score   Pre Score 15/18          Core Components/Risk Factors/Patient Goals at Admission:  Personal Goals and Risk Factors at Admission - 04/10/24 1324       Core Components/Risk Factors/Patient Goals on Admission   Improve shortness of breath with ADL's Yes    Intervention Provide education, individualized exercise plan and daily activity instruction to help decrease symptoms of SOB with activities of daily living.    Expected Outcomes Short Term: Improve cardiorespiratory fitness to achieve a reduction of symptoms when performing ADLs;Long Term: Be able to perform more ADLs  without symptoms or delay the onset of symptoms          Core Components/Risk Factors/Patient Goals Review:    Core Components/Risk Factors/Patient Goals at Discharge (Final Review):    ITP Comments: Dr. Slater Staff is Medical Director for Pulmonary Rehab at [facility].

## 2024-04-10 NOTE — Progress Notes (Signed)
 Pulmonary Rehab Orientation Physical Assessment Note  Patient Details  Name: Robert Adams MRN: [id number] Date of Birth: 10-02-1945 Referring Provider:  Dr. Jereld Boos, [place], [place]   Physical assessment reveals patient is alert and oriented x 4.  Heart rate is normal, breath sounds clear to auscultation, no wheezes, rales, or rhonchi. Pt denies chronic cough. Bowel sounds present x4 quads.  Pt denies abdominal discomfort, nausea, vomiting, diarrhea or constipation. Grip strength equal, strong. Distal pulses +2; no swelling to lower extremities.   Ronal Levin, RN, BSN

## 2024-04-10 NOTE — Progress Notes (Signed)
 Robert Adams Lowers 78 y.o. male Pulmonary Rehab Orientation Note This patient who was referred to Pulmonary Rehab by Dr. Tor with the diagnosis of COPD arrived today in Cardiac and Pulmonary Rehab. He arrived ambulatory with normal gait. He does carry portable oxygen . [facility] is the provider for their DME. Per patient, Kilan uses oxygen  infrequently. Color good, skin warm and dry. Patient is oriented to time and place. Patient's medical history, psychosocial health, and medications reviewed. Psychosocial assessment reveals patient lives with spouse. Marshel is currently retired. Patient hobbies include fishing and playing golf. Patient reports his stress level is low. Areas of stress/anxiety include health. Patient does not exhibit signs of depression. PHQ2/9 score 1/2. Gibran shows good  coping skills with positive outlook on life. Offered emotional support and reassurance. Will continue to monitor. Physical assessment performed by Ronal Levin RN. Please see their orientation physical assessment note. Lawton reports he  does take medications as prescribed. Patient states he  follows a regular  diet. The patient reports no specific efforts to gain or lose weight.. Patient's weight will be monitored closely. Demonstration and practice of PLB using pulse oximeter. Dyquan able to return demonstration satisfactorily. Safety and hand hygiene in the exercise area reviewed with patient. Ranny voices understanding of the information reviewed. Department expectations discussed with patient and achievable goals were set. The patient shows enthusiasm about attending the program and we look forward to working with Robert. Milt completed a 6 min walk test today and is scheduled to begin exercise on 04/16/24 at 10:15 am.  1250-1420 Khady Vandenberg J Calyn Rubi, MS, ACSM-CEP

## 2024-04-16 ENCOUNTER — Encounter (HOSPITAL_COMMUNITY)
Admission: RE | Admit: 2024-04-16 | Discharge: 2024-04-16 | Disposition: A | Discharge status: Home / Self Care | Source: Ambulatory Visit

## 2024-04-16 DIAGNOSIS — J449 Chronic obstructive pulmonary disease, unspecified: Secondary | ICD-10-CM

## 2024-04-16 NOTE — Progress Notes (Signed)
 Daily Session Note  Patient Details  Name: Robert Adams MRN: [id number] Date of Birth: 1946/01/11 Referring Provider:   [name] Pulmonary Rehab Walk Test from 04/10/2024 in [facility]  Referring Provider [name]    Encounter Date: 04/16/2024  Check In:  Session Check In - 04/16/24 1130       Check-In   Supervising physician immediately available to respond to emergencies [facility] MD immediately available    Physician(s) Josefa Beauvais, NP    Location [facility]    Staff Present Ronal Levin, RN, Maud Moats, MS, ACSM-CEP, Exercise Physiologist;Koua Deeg Claudene Idelia Aris BS, ACSM-CEP, Exercise Physiologist    Virtual Visit No    Medication changes reported     No    Fall or balance concerns reported    No    Tobacco Cessation No Change    Warm-up and Cool-down Performed as group-led instruction    Resistance Training Performed Yes    VAD Patient? No    PAD/SET Patient? No      Pain Assessment   Currently in Pain? No/denies    Multiple Pain Sites No          Capillary Blood Glucose: No results found for this or any previous visit (from the past 24 hours).    Social History   Tobacco Use  Smoking Status Former   Current packs/day: 0.00   Average packs/day: 1 pack/day for 30.0 years (30.0 ttl pk-yrs)   Types: Cigarettes   Start date: 06/04/1980   Quit date: 06/04/2010   Years since quitting: 13.8  Smokeless Tobacco Never    Goals Met:  Proper associated with RPD/PD & O2 Sat Independence with exercise equipment Exercise tolerated well No report of concerns or symptoms today Strength training completed today  Goals Unmet:  Not Applicable  Comments: Service time is from 1003 to 1149.    Dr. Slater Staff is Medical Director for Pulmonary Rehab at [facility].

## 2024-04-21 ENCOUNTER — Encounter (HOSPITAL_COMMUNITY)
Admission: RE | Admit: 2024-04-21 | Discharge: 2024-04-21 | Disposition: A | Discharge status: Home / Self Care | Source: Ambulatory Visit

## 2024-04-21 DIAGNOSIS — J449 Chronic obstructive pulmonary disease, unspecified: Secondary | ICD-10-CM

## 2024-04-21 NOTE — Progress Notes (Signed)
 Daily Session Note  Patient Details  Name: Robert Adams MRN: [id number] Date of Birth: 08-Jan-1946 Referring Provider:   [name] Pulmonary Rehab Walk Test from 04/10/2024 in [facility]  Referring Provider [name]    Encounter Date: 04/21/2024  Check In:  Session Check In - 04/21/24 1116       Check-In   Supervising physician immediately available to respond to emergencies [facility] MD immediately available    Physician(s) Josefa Beauvais, NP    [facility]    Staff Present Ronal Levin, RN, Maud Moats, MS, ACSM-CEP, Exercise Physiologist;Emily Forse Claudene, RT;Randi Midge BS, ACSM-CEP, Exercise Physiologist;Maria Whitaker, RN, BSN    Virtual Visit No    Medication changes reported     No    Fall or balance concerns reported    Yes    Comments fell at home a couple of days ago    Tobacco Cessation No Change    Warm-up and Cool-down Performed as group-led instruction    Resistance Training Performed Yes    VAD Patient? No    PAD/SET Patient? No      Pain Assessment   Currently in Pain? No/denies    Multiple Pain Sites No          Capillary Blood Glucose: No results found for this or any previous visit (from the past 24 hours).    Social History   Tobacco Use  Smoking Status Former   Current packs/day: 0.00   Average packs/day: 1 pack/day for 30.0 years (30.0 ttl pk-yrs)   Types: Cigarettes   Start date: 06/04/1980   Quit date: 06/04/2010   Years since quitting: 13.8  Smokeless Tobacco Never    Goals Met:  Proper associated with RPD/PD & O2 Sat Independence with exercise equipment Exercise tolerated well No report of concerns or symptoms today Strength training completed today  Goals Unmet:  Not Applicable  Comments: Service time is from 1014 to 1137.    Dr. Slater Staff is Medical Director for Pulmonary Rehab at [facility].

## 2024-04-23 ENCOUNTER — Telehealth (HOSPITAL_COMMUNITY): Payer: Self-pay | Admitting: *Deleted

## 2024-04-23 ENCOUNTER — Encounter (HOSPITAL_COMMUNITY)
Admission: RE | Admit: 2024-04-23 | Discharge: 2024-04-23 | Disposition: A | Discharge status: Home / Self Care | Source: Ambulatory Visit

## 2024-04-23 NOTE — Telephone Encounter (Signed)
 Received incoming message from this pt on departmental voicemail.  Pt continues to have soreness in his neck and shoulder from fall he had a couple of days.  Plans to return on Tuesday. Saturnino Bernett PEAK, BSN Cardiac and Emergency Planning/management Officer

## 2024-04-28 ENCOUNTER — Encounter (HOSPITAL_COMMUNITY)
Admission: RE | Admit: 2024-04-28 | Discharge: 2024-04-28 | Disposition: A | Discharge status: Home / Self Care | Source: Ambulatory Visit

## 2024-04-28 VITALS — Wt 164.5 lb

## 2024-04-28 DIAGNOSIS — J449 Chronic obstructive pulmonary disease, unspecified: Secondary | ICD-10-CM | POA: Diagnosis not present

## 2024-04-28 NOTE — Progress Notes (Signed)
 Daily Session Note  Patient Details  Name: Robert Adams MRN: [id number] Date of Birth: 1945-12-22 Referring Provider:   [name] Pulmonary Rehab Walk Test from 04/10/2024 in [facility], [facility]  Referring Provider [name]    Encounter Date: 04/28/2024  Check In:  Session Check In - 04/28/24 1035       Check-In   Supervising physician immediately available to respond to emergencies [facility] MD immediately available    Physician(s) Rosabel Mose, NP    [facility]    Staff Present Ronal Levin, RN, Maud Moats, MS, ACSM-CEP, Exercise Physiologist;Myalynn Lingle Claudene, RT;Randi Reeve BS, ACSM-CEP, Exercise Physiologist    Virtual Visit No    Medication changes reported     No    Fall or balance concerns reported    No    Tobacco Cessation No Change    Warm-up and Cool-down Performed as group-led instruction    Resistance Training Performed Yes    VAD Patient? No    PAD/SET Patient? No      Pain Assessment   Currently in Pain? No/denies          Capillary Blood Glucose: No results found for this or any previous visit (from the past 24 hours).   Exercise Prescription Changes - 04/28/24 1200       Response to Exercise   Blood Pressure (Admit) 110/66    Blood Pressure (Exercise) 130/60    Blood Pressure (Exit) 116/52    Heart Rate (Admit) 72 bpm    Heart Rate (Exercise) 92 bpm    Heart Rate (Exit) 82 bpm    Oxygen  Saturation (Admit) 100 %    Oxygen  Saturation (Exercise) 96 %    Oxygen  Saturation (Exit) 94 %    Rating of Perceived Exertion (Exercise) 13    Perceived Dyspnea (Exercise) 2    Duration Continue with 30 min of aerobic exercise without signs/symptoms of physical distress.    Intensity THRR unchanged      Progression   Progression Continue to progress workloads to maintain intensity without signs/symptoms of physical distress.      Resistance Training   Training Prescription Yes     Weight blue bands    Reps 10-15    Time 10 Minutes      Oxygen    Oxygen  Continuous    Liters 2      Recumbant Elliptical   Level 2    Minutes 15    METs 3.7      Track   Laps 7    Minutes 15    METs 2.08      Oxygen    Maintain Oxygen  Saturation 88% or higher          Social History   Tobacco Use  Smoking Status Former   Current packs/day: 0.00   Average packs/day: 1 pack/day for 30.0 years (30.0 ttl pk-yrs)   Types: Cigarettes   Start date: 06/04/1980   Quit date: 06/04/2010   Years since quitting: 13.9  Smokeless Tobacco Never    Goals Met:  Proper associated with RPD/PD & O2 Sat Independence with exercise equipment Exercise tolerated well No report of concerns or symptoms today Strength training completed today  Goals Unmet:  Not Applicable  Comments: Service time is from 1023 to 1145.    Dr. Slater Staff is Medical Director for Pulmonary Rehab at [facility].

## 2024-04-29 NOTE — Progress Notes (Signed)
 Pulmonary Individual Treatment Plan  Patient Details  Name: Robert Adams MRN: [id number] Date of Birth: [birth year] Referring Provider:   [name] Pulmonary Rehab Walk Test from 04/10/2024 in [facility]  Referring Provider [name]    Initial Encounter Date:  Flowsheet Row Pulmonary Rehab Walk Test from 04/10/2024 in [facility], [facility]  Date 04/10/24    Visit Diagnosis: Chronic obstructive pulmonary disease, unspecified COPD type (HCC)  Patient's Home Medications on Admission:   Current Outpatient Medications:    albuterol  (PROVENTIL  HFA;VENTOLIN  HFA) 108 (90 Base) MCG/ACT inhaler, Inhale 2 puffs into the lungs every 6 (six) hours as needed for wheezing or shortness of breath. , Disp: , Rfl:    apixaban  (ELIQUIS ) 5 MG TABS tablet, Take 5 mg by mouth 2 (two) times daily., Disp: , Rfl:    augmented betamethasone dipropionate (DIPROLENE-AF) 0.05 % cream, Apply 1 Application topically in the morning and at bedtime., Disp: , Rfl:    benzonatate  (TESSALON ) 200 MG capsule, Take 1 capsule (200 mg total) by mouth 3 (three) times daily as needed for cough., Disp: 60 capsule, Rfl: 0   cyanocobalamin 2000 MCG tablet, Take 2,500 mcg by mouth as needed. 2,562mcg, Disp: , Rfl:    empagliflozin  (JARDIANCE ) 10 MG TABS tablet, Take 1 tablet (10 mg total) by mouth daily before breakfast., Disp: 30 tablet, Rfl: 0   escitalopram  (LEXAPRO ) 10 MG tablet, Take 10 mg by mouth daily., Disp: , Rfl:    finasteride  (PROSCAR ) 5 MG tablet, Take 5 mg by mouth every evening., Disp: , Rfl:    guaiFENesin -Codeine  100-6.33 MG/5ML SOLN, Take 5 mLs by mouth every 6 (six) hours as needed (cough)., Disp: , Rfl:    losartan  (COZAAR ) 25 MG tablet, Take 1 tablet (25 mg total) by mouth every evening., Disp: 30 tablet, Rfl: 3   metoprolol  succinate (TOPROL -XL) 25 MG 24 hr tablet, Take 1 tablet (25 mg total) by mouth  every evening., Disp: 30 tablet, Rfl: 3   Multiple Vitamins-Minerals (PRESERVISION AREDS 2 PO), Take 1 capsule by mouth daily., Disp: , Rfl:    PACERONE 200 MG tablet, Take 200 mg by mouth daily., Disp: , Rfl:    rosuvastatin  (CRESTOR ) 40 MG tablet, Take 1 tablet (40 mg total) by mouth daily., Disp: 30 tablet, Rfl: 3   spironolactone  (ALDACTONE ) 25 MG tablet, Take 0.5 tablets (12.5 mg total) by mouth daily., Disp: 30 tablet, Rfl: 0   Tamsulosin  HCl (FLOMAX ) 0.4 MG CAPS, Take 0.4 mg by mouth at bedtime., Disp: , Rfl:    Tiotropium Bromide-Olodaterol (STIOLTO RESPIMAT ) 2.5-2.5 MCG/ACT AERS, 2 each am (Patient taking differently: Inhale 2 puffs into the lungs daily.), Disp: 1 each, Rfl: 11   traZODone  (DESYREL ) 50 MG tablet, Take 50 mg by mouth at bedtime., Disp: , Rfl:   Past Medical History: Past Medical History:  Diagnosis Date   Allergy    seasonal   Anxiety    Arthritis    BPH (benign prostatic hyperplasia)    COPD (chronic obstructive pulmonary disease) (HCC)    Depression    GERD (gastroesophageal reflux disease)    Glaucoma    Hypertension    Sleep apnea     Tobacco Use: Social History   Tobacco Use  Smoking Status Former   Current packs/day: 0.00   Average packs/day: 1 pack/day for 30.0 years (30.0 ttl pk-yrs)   Types: Cigarettes   Start date: 06/04/1980  Quit date: 06/04/2010   Years since quitting: 13.9  Smokeless Tobacco Never    Labs: Review Flowsheet       Latest Ref Rng & Units 08/23/2017 06/20/2023  Labs for ITP Cardiac and Pulmonary Rehab  Cholestrol 0 - 200 mg/dL 833  869   LDL (calc) 0 - 99 mg/dL 92  74   HDL-C >59 mg/dL 60  45   Trlycerides <849 mg/dL 68  55   PH, Arterial 2.64 - 7.45 - 7.445   PCO2 arterial 32 - 48 mmHg - 27.4   Bicarbonate 20.0 - 28.0 mmol/L 20.0 - 28.0 mmol/L - 24.1  23.4  18.8   TCO2 22 - 32 mmol/L 22 - 32 mmol/L - 25  25  20    Acid-base deficit 0.0 - 2.0 mmol/L - 1.0  4.0   O2 Saturation % % - 75  75  99     Details        Multiple values from one day are sorted in reverse-chronological order         Capillary Blood Glucose: Lab Results  Component Value Date   GLUCAP 96 08/15/2022     Pulmonary Assessment Scores:  Pulmonary Assessment Scores     Row Name 04/10/24 1314         ADL UCSD   ADL Phase Entry     SOB Score total 48       CAT Score   CAT Score 18       mMRC Score   mMRC Score 4       UCSD: Self-administered rating of dyspnea associated with activities of daily living (ADLs) 6-point scale (0 = not at all to 5 = maximal or unable to do because of breathlessness)  Scoring Scores range from 0 to 120.  Minimally important difference is 5 units  CAT: CAT can identify the health impairment of COPD patients and is better correlated with disease progression.  CAT has a scoring range of zero to 40. The CAT score is classified into four groups of low (less than 10), medium (10 - 20), high (21-30) and very high (31-40) based on the impact level of disease on health status. A CAT score over 10 suggests significant symptoms.  A worsening CAT score could be explained by an exacerbation, poor medication adherence, poor inhaler technique, or progression of COPD or comorbid conditions.  CAT MCID is 2 points  mMRC: mMRC (Modified Medical Research Council) Dyspnea Scale is used to assess the degree of baseline functional disability in patients of respiratory disease due to dyspnea. No minimal important difference is established. A decrease in score of 1 point or greater is considered a positive change.   Pulmonary Function Assessment:  Pulmonary Function Assessment - 04/10/24 1327       Breath   Shortness of Breath Yes;Limiting activity          Exercise Target Goals: Exercise Program Goal: Individual exercise prescription set using results from initial 6 min walk test and THRR while considering  patient's activity barriers and safety.   Exercise Prescription Goal: Initial exercise  prescription builds to 30-45 minutes a day of aerobic activity, 2-3 days per week.  Home exercise guidelines will be given to patient during program as part of exercise prescription that the participant will acknowledge.  Activity Barriers & Risk Stratification:  Activity Barriers & Cardiac Risk Stratification - 04/10/24 1328       Activity Barriers & Cardiac Risk Stratification   Activity Barriers  Deconditioning;Muscular Weakness;Shortness of Breath;Arthritis          6 Minute Walk:  6 Minute Walk     Row Name 04/10/24 1422         6 Minute Walk   Phase Initial     Distance 1085 feet     Walk Time 6 minutes     # of Rest Breaks 1  4:31-5:00     MPH 2.05     METS 2.34     RPE 14     Perceived Dyspnea  3     VO2 Peak 8.2     Symptoms No     Resting HR 65 bpm     Resting BP 114/62     Resting Oxygen  Saturation  97 %     Exercise Oxygen  Saturation  during 6 min walk 84 %     Max Ex. HR 92 bpm     Max Ex. BP 146/62     2 Minute Post BP 122/60       Interval HR   1 Minute HR 81     2 Minute HR 92     3 Minute HR 82     4 Minute HR 90     5 Minute HR 92     6 Minute HR 92     2 Minute Post HR 79     Interval Heart Rate? Yes       Interval Oxygen    Interval Oxygen ? Yes     Baseline Oxygen  Saturation % 97 %     1 Minute Oxygen  Saturation % 95 %     1 Minute Liters of Oxygen  0 L     2 Minute Oxygen  Saturation % 84 %     2 Minute Liters of Oxygen  0 L  increased to 2     3 Minute Oxygen  Saturation % 98 %     3 Minute Liters of Oxygen  2 L     4 Minute Oxygen  Saturation % 91 %     4 Minute Liters of Oxygen  2 L     5 Minute Oxygen  Saturation % 90 %     5 Minute Liters of Oxygen  2 L     6 Minute Oxygen  Saturation % 92 %     6 Minute Liters of Oxygen  2 L     2 Minute Post Oxygen  Saturation % 95 %     2 Minute Post Liters of Oxygen  2 L        Oxygen  Initial Assessment:  Oxygen  Initial Assessment - 04/10/24 1324       Home Oxygen    Home Oxygen  Device  E-Tanks;Home Concentrator    Sleep Oxygen  Prescription None    Home Exercise Oxygen  Prescription Continuous    Liters per minute 2    Home Resting Oxygen  Prescription None    Compliance with Home Oxygen  Use Yes      Initial 6 min Walk   Oxygen  Used None      Program Oxygen  Prescription   Program Oxygen  Prescription None      Intervention   Short Term Goals To learn and exhibit compliance with exercise, home and travel O2 prescription;To learn and understand importance of maintaining oxygen  saturations>88%;To learn and demonstrate proper use of respiratory medications;To learn and understand importance of monitoring SPO2 with pulse oximeter and demonstrate accurate use of the pulse oximeter.;To learn and demonstrate proper pursed lip breathing techniques or other breathing techniques.  Long  Term Goals Exhibits compliance with exercise, home  and travel O2 prescription;Maintenance of O2 saturations>88%;Compliance with respiratory medication;Verbalizes importance of monitoring SPO2 with pulse oximeter and return demonstration;Exhibits proper breathing techniques, such as pursed lip breathing or other method taught during program session          Oxygen  Re-Evaluation:  Oxygen  Re-Evaluation     Row Name 04/22/24 1013             Program Oxygen  Prescription   Program Oxygen  Prescription None         Home Oxygen    Home Oxygen  Device E-Tanks;Home Concentrator       Sleep Oxygen  Prescription None       Home Exercise Oxygen  Prescription Continuous       Liters per minute 2       Home Resting Oxygen  Prescription None       Compliance with Home Oxygen  Use Yes         Goals/Expected Outcomes   Short Term Goals To learn and exhibit compliance with exercise, home and travel O2 prescription;To learn and understand importance of maintaining oxygen  saturations>88%;To learn and demonstrate proper use of respiratory medications;To learn and understand importance of monitoring SPO2 with  pulse oximeter and demonstrate accurate use of the pulse oximeter.;To learn and demonstrate proper pursed lip breathing techniques or other breathing techniques.        Long  Term Goals Exhibits compliance with exercise, home  and travel O2 prescription;Maintenance of O2 saturations>88%;Compliance with respiratory medication;Verbalizes importance of monitoring SPO2 with pulse oximeter and return demonstration;Exhibits proper breathing techniques, such as pursed lip breathing or other method taught during program session       Goals/Expected Outcomes Compliance and understanding of oxygen  saturation monitoring and breathing techniques to decrease shortness of breath.          Oxygen  Discharge (Final Oxygen  Re-Evaluation):  Oxygen  Re-Evaluation - 04/22/24 1013       Program Oxygen  Prescription   Program Oxygen  Prescription None      Home Oxygen    Home Oxygen  Device E-Tanks;Home Concentrator    Sleep Oxygen  Prescription None    Home Exercise Oxygen  Prescription Continuous    Liters per minute 2    Home Resting Oxygen  Prescription None    Compliance with Home Oxygen  Use Yes      Goals/Expected Outcomes   Short Term Goals To learn and exhibit compliance with exercise, home and travel O2 prescription;To learn and understand importance of maintaining oxygen  saturations>88%;To learn and demonstrate proper use of respiratory medications;To learn and understand importance of monitoring SPO2 with pulse oximeter and demonstrate accurate use of the pulse oximeter.;To learn and demonstrate proper pursed lip breathing techniques or other breathing techniques.     Long  Term Goals Exhibits compliance with exercise, home  and travel O2 prescription;Maintenance of O2 saturations>88%;Compliance with respiratory medication;Verbalizes importance of monitoring SPO2 with pulse oximeter and return demonstration;Exhibits proper breathing techniques, such as pursed lip breathing or other method taught during program  session    Goals/Expected Outcomes Compliance and understanding of oxygen  saturation monitoring and breathing techniques to decrease shortness of breath.          Initial Exercise Prescription:  Initial Exercise Prescription - 04/10/24 1400       Date of Initial Exercise RX and Referring Provider   Date 04/10/24    Referring Provider [name]    Expected Discharge Date 07/16/24      Oxygen    Oxygen  Continuous    Liters  2    Maintain Oxygen  Saturation 88% or higher      Recumbant Elliptical   Level 1    RPM 58    Watts 82    Minutes 15    METs 3.52      Track   Minutes 15    METs 2      Prescription Details   Frequency (times per week) 2    Duration Progress to 30 minutes of continuous aerobic without signs/symptoms of physical distress      Intensity   THRR 40-80% of Max Heartrate 56-113    Ratings of Perceived Exertion 11-13    Perceived Dyspnea 0-4      Progression   Progression Continue to progress workloads to maintain intensity without signs/symptoms of physical distress.      Resistance Training   Training Prescription Yes    Weight blue bands    Reps 10-15          Perform Capillary Blood Glucose checks as needed.  Exercise Prescription Changes:   Exercise Prescription Changes     Row Name 04/28/24 1200             Response to Exercise   Blood Pressure (Admit) 110/66       Blood Pressure (Exercise) 130/60       Blood Pressure (Exit) 116/52       Heart Rate (Admit) 72 bpm       Heart Rate (Exercise) 92 bpm       Heart Rate (Exit) 82 bpm       Oxygen  Saturation (Admit) 100 %       Oxygen  Saturation (Exercise) 96 %       Oxygen  Saturation (Exit) 94 %       Rating of Perceived Exertion (Exercise) 13       Perceived Dyspnea (Exercise) 2       Duration Continue with 30 min of aerobic exercise without signs/symptoms of physical distress.       Intensity THRR unchanged         Progression   Progression Continue to progress workloads to  maintain intensity without signs/symptoms of physical distress.         Resistance Training   Training Prescription Yes       Weight blue bands       Reps 10-15       Time 10 Minutes         Oxygen    Oxygen  Continuous       Liters 2         Recumbant Elliptical   Level 2       Minutes 15       METs 3.7         Track   Laps 7       Minutes 15       METs 2.08         Oxygen    Maintain Oxygen  Saturation 88% or higher          Exercise Comments:   Exercise Comments     Row Name 04/24/24 0954           Exercise Comments Pt has completed 2nd day of exercise. He exercises for 15 min on the Octane and track. Jyron averages 2.7 METs at level 2 on the Octane and 1.92 METs on the track. He performs the warmup and cooldown standing without limitations. Discussed METs.          Exercise  Goals and Review:   Exercise Goals     Row Name 04/10/24 1329             Exercise Goals   Increase Physical Activity Yes       Intervention Provide advice, education, support and counseling about physical activity/exercise needs.;Develop an individualized exercise prescription for aerobic and resistive training based on initial evaluation findings, risk stratification, comorbidities and participant's personal goals.       Expected Outcomes Short Term: Attend rehab on a regular basis to increase amount of physical activity.;Long Term: Add in home exercise to make exercise part of routine and to increase amount of physical activity.;Long Term: Exercising regularly at least 3-5 days a week.       Increase Strength and Stamina Yes       Intervention Provide advice, education, support and counseling about physical activity/exercise needs.;Develop an individualized exercise prescription for aerobic and resistive training based on initial evaluation findings, risk stratification, comorbidities and participant's personal goals.       Expected Outcomes Short Term: Increase workloads from initial  exercise prescription for resistance, speed, and METs.;Short Term: Perform resistance training exercises routinely during rehab and add in resistance training at home;Long Term: Improve cardiorespiratory fitness, muscular endurance and strength as measured by increased METs and functional capacity ( )       Able to understand and use rate of perceived exertion (RPE) scale Yes       Intervention Provide education and explanation on how to use RPE scale       Expected Outcomes Short Term: Able to use RPE daily in rehab to express subjective intensity level;Long Term:  Able to use RPE to guide intensity level when exercising independently       Able to understand and use Dyspnea scale Yes       Intervention Provide education and explanation on how to use Dyspnea scale       Expected Outcomes Short Term: Able to use Dyspnea scale daily in rehab to express subjective sense of shortness of breath during exertion;Long Term: Able to use Dyspnea scale to guide intensity level when exercising independently       Knowledge and understanding of Target Heart Rate Range (THRR) Yes       Intervention Provide education and explanation of THRR including how the numbers were predicted and where they are located for reference       Expected Outcomes Short Term: Able to state/look up THRR;Long Term: Able to use THRR to govern intensity when exercising independently;Short Term: Able to use daily as guideline for intensity in rehab       Understanding of Exercise Prescription Yes       Intervention Provide education, explanation, and written materials on patient's individual exercise prescription       Expected Outcomes Short Term: Able to explain program exercise prescription;Long Term: Able to explain home exercise prescription to exercise independently          Exercise Goals Re-Evaluation :  Exercise Goals Re-Evaluation     Row Name 04/22/24 1010 04/24/24 0954           Exercise Goal Re-Evaluation    Exercise Goals Review Increase Physical Activity;Able to understand and use Dyspnea scale;Understanding of Exercise Prescription;Increase Strength and Stamina;Knowledge and understanding of Target Heart Rate Range (THRR);Able to understand and use rate of perceived exertion (RPE) scale Increase Physical Activity;Able to understand and use Dyspnea scale;Understanding of Exercise Prescription;Increase Strength and Stamina;Knowledge and understanding of Target Heart  Rate Range (THRR);Able to understand and use rate of perceived exertion (RPE) scale      Comments Robert Adams has completed 2 exercise sessions. He exercises for 15 min on the Octane and track. He averages 2.7 METs at level 2 on the Octane and 1.92 METs on the track. He performs the warmup and cooldown standing without limitations. It is too soon to notate any discernable progressions. Will continue to monitor and progress as able. Robert Adams has completed 2 exercise sessions. He exercises for 15 min on the Octane and track. He averages 2.7 METs at level 2 on the Octane and 1.92 METs on the track. He performs the warmup and cooldown standing without limitations. It is too soon to notate any discernable progressions. Will continue to monitor and progress as able.      Expected Outcomes Through exercise at rehab and home, the patient will decrease shortness of breath with daily activities and feel confident in carrying out an exercise regimen at home. Through exercise at rehab and home, the patient will decrease shortness of breath with daily activities and feel confident in carrying out an exercise regimen at home.         Discharge Exercise Prescription (Final Exercise Prescription Changes):  Exercise Prescription Changes - 04/28/24 1200       Response to Exercise   Blood Pressure (Admit) 110/66    Blood Pressure (Exercise) 130/60    Blood Pressure (Exit) 116/52    Heart Rate (Admit) 72 bpm    Heart Rate (Exercise) 92 bpm    Heart Rate (Exit) 82 bpm     Oxygen  Saturation (Admit) 100 %    Oxygen  Saturation (Exercise) 96 %    Oxygen  Saturation (Exit) 94 %    Rating of Perceived Exertion (Exercise) 13    Perceived Dyspnea (Exercise) 2    Duration Continue with 30 min of aerobic exercise without signs/symptoms of physical distress.    Intensity THRR unchanged      Progression   Progression Continue to progress workloads to maintain intensity without signs/symptoms of physical distress.      Resistance Training   Training Prescription Yes    Weight blue bands    Reps 10-15    Time 10 Minutes      Oxygen    Oxygen  Continuous    Liters 2      Recumbant Elliptical   Level 2    Minutes 15    METs 3.7      Track   Laps 7    Minutes 15    METs 2.08      Oxygen    Maintain Oxygen  Saturation 88% or higher          Nutrition:  Target Goals: Understanding of nutrition guidelines, daily intake of sodium 1500mg , cholesterol 200mg , calories 30% from fat and 7% or less from saturated fats, daily to have 5 or more servings of fruits and vegetables.  Biometrics:    Nutrition Therapy Plan and Nutrition Goals:  Nutrition Therapy & Goals - 04/16/24 1242       Nutrition Therapy   Diet Regular diet    Drug/Food Interactions Statins/Certain Fruits      Personal Nutrition Goals   Nutrition Goal Patient to improve diet quality by using the plate method as a guide for meal planning to include lean protein/plant protein, fruits, vegetables, whole grains, nonfat dairy as part of a well-balanced diet.    Comments Pt with medical history significant for COPD, CAD, HTN, heart failure, IBD.  Pt identifies need to improve diet quality, though verbalizes some reluctance in making dietary changes. Current wt borderline low based on BMI < 23 kg/m2 for >= 65 years.      Intervention Plan   Intervention Prescribe, educate and counsel regarding individualized specific dietary modifications aiming towards targeted core components such as weight,  hypertension, lipid management, diabetes, heart failure and other comorbidities.;Nutrition handout(s) given to patient.   Handdout: Nutrition Tips for Better Breathing   Expected Outcomes Short Term Goal: Understand basic principles of dietary content, such as calories, fat, sodium, cholesterol and nutrients.;Long Term Goal: Adherence to prescribed nutrition plan.          Nutrition Assessments:  MEDIFICTS Score Key: >=70 Need to make dietary changes  40-70 Heart Healthy Diet <= 40 Therapeutic Level Cholesterol Diet  Flowsheet Row PULMONARY REHAB CHRONIC OBSTRUCTIVE PULMONARY DISEASE from 04/21/2024 in [facility]  Picture Your Plate Total Score on Admission 47   Picture Your Plate Scores: <59 Unhealthy dietary pattern with much room for improvement. 41-50 Dietary pattern unlikely to meet recommendations for good health and room for improvement. 51-60 More healthful dietary pattern, with some room for improvement.  >60 Healthy dietary pattern, although there may be some specific behaviors that could be improved.    Nutrition Goals Re-Evaluation:   Nutrition Goals Discharge (Final Nutrition Goals Re-Evaluation):   Psychosocial: Target Goals: Acknowledge presence or absence of significant depression and/or stress, maximize coping skills, provide positive support system. Participant is able to verbalize types and ability to use techniques and skills needed for reducing stress and depression.  Initial Review & Psychosocial Screening:  Initial Psych Review & Screening - 04/10/24 1321       Initial Review   Current issues with Current Psychotropic Meds;Current Anxiety/Panic    Comments Pt taking medication for anxiety. He feels this benefits him.      Family Dynamics   Good Support System? Yes    Comments Spouse, son, and daughter      Barriers   Psychosocial barriers to participate in program There are no identifiable  barriers or psychosocial needs.      Screening Interventions   Interventions Encouraged to exercise          Quality of Life Scores:  Scores of 19 and below usually indicate a poorer quality of life in these areas.  A difference of  2-3 points is a clinically meaningful difference.  A difference of 2-3 points in the total score of the Quality of Life Index has been associated with significant improvement in overall quality of life, self-image, physical symptoms, and general health in studies assessing change in quality of life.  PHQ-9: Review Flowsheet       04/10/2024  Depression screen PHQ 2/9  Decreased Interest 1  Down, Depressed, Hopeless 1  PHQ - 2 Score 2  Altered sleeping 0  Tired, decreased energy 0  Change in appetite 0  Feeling bad or failure about yourself  0  Trouble concentrating 0  Moving slowly or fidgety/restless 0  Suicidal thoughts 0  PHQ-9 Score 2  Difficult doing work/chores Somewhat difficult   Interpretation of Total Score  Total Score Depression Severity:  1-4 = Minimal depression, 5-9 = Mild depression, 10-14 = Moderate depression, 15-19 = Moderately severe depression, 20-27 = Severe depression   Psychosocial Evaluation and Intervention:  Psychosocial Evaluation - 04/10/24 1323       Psychosocial Evaluation & Interventions  Interventions Encouraged to exercise with the program and follow exercise prescription    Comments Juliano denies any psychosocial barriers at this time.    Expected Outcomes For Antoney to participate in rehab free of concerns.    Continue Psychosocial Services  No Follow up required          Psychosocial Re-Evaluation:  Psychosocial Re-Evaluation     Row Name 04/22/24 [phone]             Psychosocial Re-Evaluation   Current issues with Current Anxiety/Panic;Current Psychotropic Meds       Comments Monthly psy/soc re-eval is as follows: Robert Adams continues to deny any psy/soc barriers or concerns at this time. Robert Adams has  good support from his family.       Expected Outcomes For Robert Adams to participate in PR free of any psy/soc barriers or concerns.       Interventions Encouraged to attend Pulmonary Rehabilitation for the exercise       Continue Psychosocial Services  No Follow up required          Psychosocial Discharge (Final Psychosocial Re-Evaluation):  Psychosocial Re-Evaluation - 04/22/24 0925       Psychosocial Re-Evaluation   Current issues with Current Anxiety/Panic;Current Psychotropic Meds    Comments Monthly psy/soc re-eval is as follows: Robert Adams continues to deny any psy/soc barriers or concerns at this time. Robert Adams has good support from his family.    Expected Outcomes For Robert Adams to participate in PR free of any psy/soc barriers or concerns.    Interventions Encouraged to attend Pulmonary Rehabilitation for the exercise    Continue Psychosocial Services  No Follow up required          Education: Education Goals: Education classes will be provided on a weekly basis, covering required topics. Participant will state understanding/return demonstration of topics presented.  Learning Barriers/Preferences:  Learning Barriers/Preferences - 04/10/24 1324       Learning Barriers/Preferences   Learning Barriers Sight    Learning Preferences Skilled Demonstration          Education Topics: Know Your Numbers Group instruction that is supported by a PowerPoint presentation. Instructor discusses importance of knowing and understanding resting, exercise, and post-exercise oxygen  saturation, heart rate, and blood pressure. Oxygen  saturation, heart rate, blood pressure, rating of perceived exertion, and dyspnea are reviewed along with a normal range for these values.    Exercise for the Pulmonary Patient Group instruction that is supported by a PowerPoint presentation. Instructor discusses benefits of exercise, core components of exercise, frequency, duration, and intensity of an exercise routine,  importance of utilizing pulse oximetry during exercise, safety while exercising, and options of places to exercise outside of rehab.    MET Level  Group instruction provided by PowerPoint, verbal discussion, and written material to support subject matter. Instructor reviews what METs are and how to increase METs.    Pulmonary Medications Verbally interactive group education provided by instructor with focus on inhaled medications and proper administration.   Anatomy and Physiology of the Respiratory System Group instruction provided by PowerPoint, verbal discussion, and written material to support subject matter. Instructor reviews respiratory cycle and anatomical components of the respiratory system and their functions. Instructor also reviews differences in obstructive and restrictive respiratory diseases with examples of each.    Oxygen  Safety Group instruction provided by PowerPoint, verbal discussion, and written material to support subject matter. There is an overview of "What is Oxygen " and "Why do we need it".  Instructor also reviews how  to create a safe environment for oxygen  use, the importance of using oxygen  as prescribed, and the risks of noncompliance. There is a brief discussion on traveling with oxygen  and resources the patient may utilize.   Oxygen  Use Group instruction provided by PowerPoint, verbal discussion, and written material to discuss how supplemental oxygen  is prescribed and different types of oxygen  supply systems. Resources for more information are provided.    Breathing Techniques Group instruction that is supported by demonstration and informational handouts. Instructor discusses the benefits of pursed lip and diaphragmatic breathing and detailed demonstration on how to perform both.     Risk Factor Reduction Group instruction that is supported by a PowerPoint presentation. Instructor discusses the definition of a risk factor, different risk factors for  pulmonary disease, and how the heart and lungs work together.   Pulmonary Diseases Group instruction provided by PowerPoint, verbal discussion, and written material to support subject matter. Instructor gives an overview of the different type of pulmonary diseases. There is also a discussion on risk factors and symptoms as well as ways to manage the diseases. Flowsheet Row PULMONARY REHAB CHRONIC OBSTRUCTIVE PULMONARY DISEASE from 04/16/2024 in [facility]  Date 04/16/24  Educator RT  Instruction Review Code 1- Verbalizes Understanding    Stress and Energy Conservation Group instruction provided by PowerPoint, verbal discussion, and written material to support subject matter. Instructor gives an overview of stress and the impact it can have on the body. Instructor also reviews ways to reduce stress. There is also a discussion on energy conservation and ways to conserve energy throughout the day.   Warning Signs and Symptoms Group instruction provided by PowerPoint, verbal discussion, and written material to support subject matter. Instructor reviews warning signs and symptoms of stroke, heart attack, cold and flu. Instructor also reviews ways to prevent the spread of infection.   Other Education Group or individual verbal, written, or video instructions that support the educational goals of the pulmonary rehab program.    Knowledge Questionnaire Score:  Knowledge Questionnaire Score - 04/10/24 1313       Knowledge Questionnaire Score   Pre Score 15/18          Core Components/Risk Factors/Patient Goals at Admission:  Personal Goals and Risk Factors at Admission - 04/10/24 1324       Core Components/Risk Factors/Patient Goals on Admission   Improve shortness of breath with ADL's Yes    Intervention Provide education, individualized exercise plan and daily activity instruction to help decrease symptoms of SOB with  activities of daily living.    Expected Outcomes Short Term: Improve cardiorespiratory fitness to achieve a reduction of symptoms when performing ADLs;Long Term: Be able to perform more ADLs without symptoms or delay the onset of symptoms          Core Components/Risk Factors/Patient Goals Review:   Goals and Risk Factor Review     Row Name 04/22/24 0926             Core Components/Risk Factors/Patient Goals Review   Personal Goals Review Improve shortness of breath with ADL's;Develop more efficient breathing techniques such as purse lipped breathing and diaphragmatic breathing and practicing self-pacing with activity.       Review Monthly review of patient's Core Components/Risk Factors/Patient Goals are as follows: Goal progressing for improving shortness of breath. Robert Adams is currently exercising on 2L to keep sats >88%. He is exercising on the recumbant elliptical and walking the track.  Goal progressing for developing more efficient breathing techniques such as purse lipped breathing and diaphragmatic breathing; and practicing self-pacing with activity. We will continue to monitor his progress throughout the program.       Expected Outcomes To improve shortness of breath with ADL's and develop more efficient breathing techniques such as purse lipped breathing and diaphragmatic breathing; and practicing self-pacing with activity.          Core Components/Risk Factors/Patient Goals at Discharge (Final Review):   Goals and Risk Factor Review - 04/22/24 0926       Core Components/Risk Factors/Patient Goals Review   Personal Goals Review Improve shortness of breath with ADL's;Develop more efficient breathing techniques such as purse lipped breathing and diaphragmatic breathing and practicing self-pacing with activity.    Review Monthly review of patient's Core Components/Risk Factors/Patient Goals are as follows: Goal progressing for improving shortness of breath. Robert Adams is currently  exercising on 2L to keep sats >88%. He is exercising on the recumbant elliptical and walking the track. Goal progressing for developing more efficient breathing techniques such as purse lipped breathing and diaphragmatic breathing; and practicing self-pacing with activity. We will continue to monitor his progress throughout the program.    Expected Outcomes To improve shortness of breath with ADL's and develop more efficient breathing techniques such as purse lipped breathing and diaphragmatic breathing; and practicing self-pacing with activity.          ITP Comments: Pt is making expected progress toward Pulmonary Rehab goals after completing 3 session(s). Recommend continued exercise, life style modification, education, and utilization of breathing techniques to increase stamina and strength, while also decreasing shortness of breath with exertion.  Dr. Slater Staff is Medical Director for Pulmonary Rehab at [facility].

## 2024-05-05 ENCOUNTER — Encounter (HOSPITAL_COMMUNITY)

## 2024-05-07 ENCOUNTER — Telehealth (HOSPITAL_COMMUNITY): Payer: Self-pay

## 2024-05-07 ENCOUNTER — Encounter (HOSPITAL_COMMUNITY)
Admission: RE | Admit: 2024-05-07 | Discharge: 2024-05-07 | Disposition: A | Discharge status: Home / Self Care | Source: Ambulatory Visit

## 2024-05-07 DIAGNOSIS — J449 Chronic obstructive pulmonary disease, unspecified: Secondary | ICD-10-CM | POA: Insufficient documentation

## 2024-05-07 NOTE — Telephone Encounter (Deleted)
-----   Message from [place] sent at 05/07/2024  9:09 AM EST ----- Hello all, This pt called and stated that he was in the ER last night and that he has to have more test ran on him. He will be out today. If someone can f/u with him that would be great. Thank you.

## 2024-05-07 NOTE — Telephone Encounter (Signed)
 Pt will be out for his pulmonary rehab session on today. Pt stated that he had to go to the ER and has to have more test ran on him.

## 2024-05-07 NOTE — Telephone Encounter (Addendum)
Nothing further need.

## 2024-05-12 ENCOUNTER — Encounter (HOSPITAL_COMMUNITY): Admission: RE | Admit: 2024-05-12 | Source: Ambulatory Visit

## 2024-05-12 ENCOUNTER — Telehealth (HOSPITAL_COMMUNITY): Payer: Self-pay

## 2024-05-12 NOTE — Telephone Encounter (Signed)
 Returned call from Dorn's wife, Robert Adams. I recommended discharging from Pulmonary Rehab. Robert Adams agreed with this. Will discharge Robert Adams from PR.

## 2024-05-12 NOTE — Telephone Encounter (Signed)
 Patient's wife called and stated that Robert Adams is in and out of ER.  She wanted to cancel his Pulmonary Rehab appointments until after Christmas

## 2024-05-13 NOTE — Progress Notes (Signed)
 Discharge Progress Report  Patient Details  Name: Robert Adams MRN: [id number] Date of Birth: 1946-04-24 Referring Provider:   [name] Walk Test from 04/10/2024 in [facility]  Referring Provider [name]     Number of Visits: 3  Reason for Discharge:  Early Exit:  extended illness  Smoking History:  Social History   Tobacco Use  Smoking Status Former   Current packs/day: 0.00   Average packs/day: 1 pack/day for 30.0 years (30.0 ttl pk-yrs)   Types: Cigarettes   Start date: 06/04/1980   Quit date: 06/04/2010   Years since quitting: 13.9  Smokeless Tobacco Never    Diagnosis:  Chronic obstructive pulmonary disease, unspecified COPD type (HCC)  ADL UCSD:  Pulmonary Assessment Scores     Row Name 04/10/24 1314         ADL UCSD   ADL Phase Entry     SOB Score total 48       CAT Score   CAT Score 18       mMRC Score   mMRC Score 4        Initial Exercise Prescription:  Initial Exercise Prescription - 04/10/24 1400       Date of Initial Exercise RX and Referring Provider   Date 04/10/24    Referring Provider [name]    Expected Discharge Date 07/16/24      Oxygen    Oxygen  Continuous    Liters 2    Maintain Oxygen  Saturation 88% or higher      Recumbant Elliptical   Level 1    RPM 58    Watts 82    Minutes 15    METs 3.52      Track   Minutes 15    METs 2      Prescription Details   Frequency (times per week) 2    Duration Progress to 30 minutes of continuous aerobic without signs/symptoms of physical distress      Intensity   THRR 40-80% of Max Heartrate 56-113    Ratings of Perceived Exertion 11-13    Perceived Dyspnea 0-4      Progression   Progression Continue to progress workloads to maintain intensity without signs/symptoms of physical distress.      Resistance Training   Training Prescription Yes    Weight blue bands    Reps 10-15           Discharge Exercise Prescription (Final Exercise Prescription Changes):  Exercise Prescription Changes - 04/28/24 1200       Response to Exercise   Blood Pressure (Admit) 110/66    Blood Pressure (Exercise) 130/60    Blood Pressure (Exit) 116/52    Heart Rate (Admit) 72 bpm    Heart Rate (Exercise) 92 bpm    Heart Rate (Exit) 82 bpm    Oxygen  Saturation (Admit) 100 %    Oxygen  Saturation (Exercise) 96 %    Oxygen  Saturation (Exit) 94 %    Rating of Perceived Exertion (Exercise) 13    Perceived Dyspnea (Exercise) 2    Duration Continue with 30 min of aerobic exercise without signs/symptoms of physical distress.    Intensity THRR unchanged      Progression   Progression Continue to progress workloads to maintain intensity without signs/symptoms of physical distress.      Resistance Training   Training Prescription Yes    Weight blue bands    Reps 10-15  Time 10 Minutes      Oxygen    Oxygen  Continuous    Liters 2      Recumbant Elliptical   Level 2    Minutes 15    METs 3.7      Track   Laps 7    Minutes 15    METs 2.08      Oxygen    Maintain Oxygen  Saturation 88% or higher          Functional Capacity:  6 Minute Walk     Row Name 04/10/24 1422         6 Minute Walk   Phase Initial     Distance 1085 feet     Walk Time 6 minutes     # of Rest Breaks 1  4:31-5:00     MPH 2.05     METS 2.34     RPE 14     Perceived Dyspnea  3     VO2 Peak 8.2     Symptoms No     Resting HR 65 bpm     Resting BP 114/62     Resting Oxygen  Saturation  97 %     Exercise Oxygen  Saturation  during 6 min walk 84 %     Max Ex. HR 92 bpm     Max Ex. BP 146/62     2 Minute Post BP 122/60       Interval HR   1 Minute HR 81     2 Minute HR 92     3 Minute HR 82     4 Minute HR 90     5 Minute HR 92     6 Minute HR 92     2 Minute Post HR 79     Interval Heart Rate? Yes       Interval Oxygen    Interval Oxygen ? Yes     Baseline Oxygen  Saturation % 97 %     1  Minute Oxygen  Saturation % 95 %     1 Minute Liters of Oxygen  0 L     2 Minute Oxygen  Saturation % 84 %     2 Minute Liters of Oxygen  0 L  increased to 2     3 Minute Oxygen  Saturation % 98 %     3 Minute Liters of Oxygen  2 L     4 Minute Oxygen  Saturation % 91 %     4 Minute Liters of Oxygen  2 L     5 Minute Oxygen  Saturation % 90 %     5 Minute Liters of Oxygen  2 L     6 Minute Oxygen  Saturation % 92 %     6 Minute Liters of Oxygen  2 L     2 Minute Post Oxygen  Saturation % 95 %     2 Minute Post Liters of Oxygen  2 L        Psychological, QOL, Others - Outcomes: PHQ 2/9:    04/10/2024    1:12 PM  Depression screen PHQ 2/9  Decreased Interest 1  Down, Depressed, Hopeless 1  PHQ - 2 Score 2  Altered sleeping 0  Tired, decreased energy 0  Change in appetite 0  Feeling bad or failure about yourself  0  Trouble concentrating 0  Moving slowly or fidgety/restless 0  Suicidal thoughts 0  PHQ-9 Score 2  Difficult doing work/chores Somewhat difficult    Quality of Life:   Personal Goals: Goals established at orientation  with interventions provided to work toward goal.  Personal Goals and Risk Factors at Admission - 04/10/24 1324       Core Components/Risk Factors/Patient Goals on Admission   Improve shortness of breath with ADL's Yes    Intervention Provide education, individualized exercise plan and daily activity instruction to help decrease symptoms of SOB with activities of daily living.    Expected Outcomes Short Term: Improve cardiorespiratory fitness to achieve a reduction of symptoms when performing ADLs;Long Term: Be able to perform more ADLs without symptoms or delay the onset of symptoms           Personal Goals Discharge:  Goals and Risk Factor Review     Row Name 04/22/24 0926 05/13/24 1136           Core Components/Risk Factors/Patient Goals Review   Personal Goals Review Improve shortness of breath with ADL's;Develop more efficient breathing  techniques such as purse lipped breathing and diaphragmatic breathing and practicing self-pacing with activity. Improve shortness of breath with ADL's;Develop more efficient breathing techniques such as purse lipped breathing and diaphragmatic breathing and practicing self-pacing with activity.      Review Monthly review of patients Core Components/Risk Factors/Patient Goals are as follows: Goal progressing for improving shortness of breath. Anel is currently exercising on 2L to keep sats >88%. He is exercising on the recumbant elliptical and walking the track. Goal progressing for developing more efficient breathing techniques such as purse lipped breathing and diaphragmatic breathing; and practicing self-pacing with activity. We will continue to monitor his progress throughout the program. Jahon withdrew from the [facility] program on 12/9 completing 3 sessions. Discharge review of patients Core Components/Risk Factors/Patient Goals are as follows: Goal not met for improving shortness of breath with ADLs. Jerone had to use 2L [place] with exercise to keep his sats >88%. Goal not met for developing more efficient breathing techniques such as purse lipped breathing and diaphragmatic breathing; and practicing self-pacing with activity. Due to inconsistency attendance from illness Teague was unable to meet his goals at time of discharge.      Expected Outcomes To improve shortness of breath with ADL's and develop more efficient breathing techniques such as purse lipped breathing and diaphragmatic breathing; and practicing self-pacing with activity. To improve shortness of breath with ADL's and develop more efficient breathing techniques such as purse lipped breathing and diaphragmatic breathing; and practicing self-pacing with activity post Pulm Rehab         Exercise Goals and Review:  Exercise Goals     Row Name 04/10/24 1329             Exercise Goals   Increase Physical Activity Yes       Intervention  Provide advice, education, support and counseling about physical activity/exercise needs.;Develop an individualized exercise prescription for aerobic and resistive training based on initial evaluation findings, risk stratification, comorbidities and participant's personal goals.       Expected Outcomes Short Term: Attend rehab on a regular basis to increase amount of physical activity.;Long Term: Add in home exercise to make exercise part of routine and to increase amount of physical activity.;Long Term: Exercising regularly at least 3-5 days a week.       Increase Strength and Stamina Yes       Intervention Provide advice, education, support and counseling about physical activity/exercise needs.;Develop an individualized exercise prescription for aerobic and resistive training based on initial evaluation findings, risk stratification, comorbidities and participant's personal goals.  Expected Outcomes Short Term: Increase workloads from initial exercise prescription for resistance, speed, and METs.;Short Term: Perform resistance training exercises routinely during rehab and add in resistance training at home;Long Term: Improve cardiorespiratory fitness, muscular endurance and strength as measured by increased METs and functional capacity ( )       Able to understand and use rate of perceived exertion (RPE) scale Yes       Intervention Provide education and explanation on how to use RPE scale       Expected Outcomes Short Term: Able to use RPE daily in rehab to express subjective intensity level;Long Term:  Able to use RPE to guide intensity level when exercising independently       Able to understand and use Dyspnea scale Yes       Intervention Provide education and explanation on how to use Dyspnea scale       Expected Outcomes Short Term: Able to use Dyspnea scale daily in rehab to express subjective sense of shortness of breath during exertion;Long Term: Able to use Dyspnea scale to guide  intensity level when exercising independently       Knowledge and understanding of Target Heart Rate Range (THRR) Yes       Intervention Provide education and explanation of THRR including how the numbers were predicted and where they are located for reference       Expected Outcomes Short Term: Able to state/look up THRR;Long Term: Able to use THRR to govern intensity when exercising independently;Short Term: Able to use daily as guideline for intensity in rehab       Understanding of Exercise Prescription Yes       Intervention Provide education, explanation, and written materials on patient's individual exercise prescription       Expected Outcomes Short Term: Able to explain program exercise prescription;Long Term: Able to explain home exercise prescription to exercise independently          Exercise Goals Re-Evaluation:  Exercise Goals Re-Evaluation     Row Name 04/22/24 1010 04/24/24 0954 05/13/24 1013         Exercise Goal Re-Evaluation   Exercise Goals Review Increase Physical Activity;Able to understand and use Dyspnea scale;Understanding of Exercise Prescription;Increase Strength and Stamina;Knowledge and understanding of Target Heart Rate Range (THRR);Able to understand and use rate of perceived exertion (RPE) scale Increase Physical Activity;Able to understand and use Dyspnea scale;Understanding of Exercise Prescription;Increase Strength and Stamina;Knowledge and understanding of Target Heart Rate Range (THRR);Able to understand and use rate of perceived exertion (RPE) scale Increase Physical Activity;Able to understand and use Dyspnea scale;Understanding of Exercise Prescription;Increase Strength and Stamina;Knowledge and understanding of Target Heart Rate Range (THRR);Able to understand and use rate of perceived exertion (RPE) scale     Comments Medhansh has completed 2 exercise sessions. He exercises for 15 min on the Octane and track. He averages 2.7 METs at level 2 on the Octane and  1.92 METs on the track. He performs the warmup and cooldown standing without limitations. It is too soon to notate any discernable progressions. Will continue to monitor and progress as able. Doron has completed 2 exercise sessions. He exercises for 15 min on the Octane and track. He averages 2.7 METs at level 2 on the Octane and 1.92 METs on the track. He performs the warmup and cooldown standing without limitations. It is too soon to notate any discernable progressions. Will continue to monitor and progress as able. Elye has completed 3 exercise sessions. His peak METs were 3.7  on the Octane and 2.38 METs on the track. Pt is being discharged due to illness.     Expected Outcomes Through exercise at rehab and home, the patient will decrease shortness of breath with daily activities and feel confident in carrying out an exercise regimen at home. Through exercise at rehab and home, the patient will decrease shortness of breath with daily activities and feel confident in carrying out an exercise regimen at home. Through exercise at rehab and home, the patient will decrease shortness of breath with daily activities and feel confident in carrying out an exercise regimen at home.        Nutrition & Weight - Outcomes:    Nutrition:  Nutrition Therapy & Goals - 04/16/24 1242       Nutrition Therapy   Diet Regular diet    Drug/Food Interactions Statins/Certain Fruits      Personal Nutrition Goals   Nutrition Goal Patient to improve diet quality by using the plate method as a guide for meal planning to include lean protein/plant protein, fruits, vegetables, whole grains, nonfat dairy as part of a well-balanced diet.    Comments Pt with medical history significant for COPD, CAD, HTN, heart failure, IBD. Pt identifies need to improve diet quality, though verbalizes some reluctance in making dietary changes. Current wt borderline low based on BMI < 23 kg/m2 for >= 65 years.      Intervention Plan    Intervention Prescribe, educate and counsel regarding individualized specific dietary modifications aiming towards targeted core components such as weight, hypertension, lipid management, diabetes, heart failure and other comorbidities.;Nutrition handout(s) given to patient.   Handdout: Nutrition Tips for Better Breathing   Expected Outcomes Short Term Goal: Understand basic principles of dietary content, such as calories, fat, sodium, cholesterol and nutrients.;Long Term Goal: Adherence to prescribed nutrition plan.          Nutrition Discharge:   Education Questionnaire Score:  Knowledge Questionnaire Score - 04/10/24 1313       Knowledge Questionnaire Score   Pre Score 15/18          Goals reviewed with patient; copy given to patient.

## 2024-05-14 ENCOUNTER — Encounter (HOSPITAL_COMMUNITY)

## 2024-05-19 ENCOUNTER — Encounter (HOSPITAL_COMMUNITY)

## 2024-05-21 ENCOUNTER — Encounter (HOSPITAL_COMMUNITY)

## 2024-05-26 ENCOUNTER — Ambulatory Visit

## 2024-05-26 ENCOUNTER — Encounter (HOSPITAL_COMMUNITY)

## 2024-05-26 ENCOUNTER — Ambulatory Visit: Admitting: Internal Medicine

## 2024-05-26 ENCOUNTER — Encounter: Payer: Self-pay | Admitting: Internal Medicine

## 2024-05-26 VITALS — BP 128/66 | HR 76 | Ht 70.0 in | Wt 162.8 lb

## 2024-05-26 DIAGNOSIS — R0609 Other forms of dyspnea: Secondary | ICD-10-CM

## 2024-05-26 DIAGNOSIS — J449 Chronic obstructive pulmonary disease, unspecified: Secondary | ICD-10-CM

## 2024-05-26 DIAGNOSIS — J441 Chronic obstructive pulmonary disease with (acute) exacerbation: Secondary | ICD-10-CM | POA: Diagnosis not present

## 2024-05-26 DIAGNOSIS — Z87891 Personal history of nicotine dependence: Secondary | ICD-10-CM

## 2024-05-26 LAB — CBC WITH DIFFERENTIAL/PLATELET
Basophils Absolute: 0 K/uL (ref 0.0–0.1)
Basophils Relative: 0.1 % (ref 0.0–3.0)
Eosinophils Absolute: 0 K/uL (ref 0.0–0.7)
Eosinophils Relative: 0 % (ref 0.0–5.0)
HCT: 47.5 % (ref 39.0–52.0)
Hemoglobin: 15.6 g/dL (ref 13.0–17.0)
Lymphocytes Relative: 2.5 % — ABNORMAL LOW (ref 12.0–46.0)
Lymphs Abs: 0.4 K/uL — ABNORMAL LOW (ref 0.7–4.0)
MCHC: 32.8 g/dL (ref 30.0–36.0)
MCV: 89.2 fl (ref 78.0–100.0)
Monocytes Absolute: 0.3 K/uL (ref 0.1–1.0)
Monocytes Relative: 1.8 % — ABNORMAL LOW (ref 3.0–12.0)
Neutro Abs: 15 K/uL — ABNORMAL HIGH (ref 1.4–7.7)
Neutrophils Relative %: 95.6 % — ABNORMAL HIGH (ref 43.0–77.0)
Platelets: 373 K/uL (ref 150.0–400.0)
RBC: 5.33 Mil/uL (ref 4.22–5.81)
RDW: 14.3 % (ref 11.5–15.5)
WBC: 15.6 K/uL — ABNORMAL HIGH (ref 4.0–10.5)

## 2024-05-26 LAB — BASIC METABOLIC PANEL WITH GFR
BUN: 32 mg/dL — ABNORMAL HIGH (ref 6–23)
CO2: 26 meq/L (ref 19–32)
Calcium: 10 mg/dL (ref 8.4–10.5)
Chloride: 100 meq/L (ref 96–112)
Creatinine, Ser: 0.95 mg/dL (ref 0.40–1.50)
GFR: 75.36 mL/min
Glucose, Bld: 119 mg/dL — ABNORMAL HIGH (ref 70–99)
Potassium: 4.8 meq/L (ref 3.5–5.1)
Sodium: 137 meq/L (ref 135–145)

## 2024-05-26 LAB — BRAIN NATRIURETIC PEPTIDE: Pro B Natriuretic peptide (BNP): 88 pg/mL (ref 1.0–100.0)

## 2024-05-26 LAB — SEDIMENTATION RATE: Sed Rate: 1 mm/h (ref 0–20)

## 2024-05-26 LAB — TSH: TSH: 1.05 u[IU]/mL (ref 0.35–5.50)

## 2024-05-26 NOTE — Patient Instructions (Addendum)
 Stop asmanex  Plan A = Automatic = Always=    Stiolto 2 puff each am   Plan B = Backup (to supplement plan A, not to replace it) Use your albuterol  inhaler as a rescue medication to be used if you can't catch your breath by resting or slowing your pace  or doing a relaxed purse lip breathing pattern.  - The less you use it, the better it will work when you need it. - Ok to use the inhaler up to 2 puffs  every 4 hours if you must but call for appointment if use goes up over your usual need - Don't leave home without it !!  (think of it like the spare tire or starter fluid for your car)   Plan C = Crisis (instead of Plan B but only if Plan B stops working) - only use your albuterol  nebulizer if you first try Plan B and it fails to help > ok to use the nebulizer up to every 4 hours but if start needing it regularly call for immediate appointment  Also  Ok to try albuterol  15 min before an activity (on alternating days)  that you know would usually make you short of breath and see if it makes any difference and if makes none then don't take albuterol  after activity unless you can't catch your breath as this means it's the resting that helps, not the albuterol .      Make sure you check your oxygen  saturation at your highest level of activity(NOT after you stop)  to be sure it stays over 90% and keep track of it at least once a week, more often if breathing getting worse, and let me know if losing ground. (Collect the dots to connect the dots approach)    Pantoprazole  (protonix ) 40 mg   Take  30-60 min before first meal of the day and Pepcid  (famotidine )  20 mg after supper until return to office - this is the best way to tell whether stomach acid is contributing to your problem.    GERD (REFLUX)  is an extremely common cause of respiratory symptoms just like yours , many times with no obvious heartburn at all.    It can be treated with medication, but also with lifestyle changes including elevation  of the head of your bed (ideally with 6 -8inch blocks under the headboard of your bed),  Smoking cessation, avoidance of late meals, excessive alcohol, and avoid fatty foods, chocolate, peppermint, colas, red wine, and acidic juices such as orange juice.  NO MINT OR MENTHOL  PRODUCTS SO NO COUGH DROPS - LUDENs USE SUGARLESS CANDY INSTEAD (Jolley ranchers or [employer] or [employer]) or even ice chips will also do - the key is to swallow to prevent all throat clearing. NO OIL BASED VITAMINS - use powdered substitutes.  Avoid fish oil when coughing.  Prednisone  20 mg 2 x 2 days then 1 x 2 days the one half x a week then try off.  Please remember to go to the  x-ray department  for your tests - we will call you with the results when they are available   Please remember to go to the lab department   for your tests - we will call you with the results when they are available.      Please schedule a follow up office visit in 4 weeks, sooner if needed  with all medications /inhalers/ solutions in hand so we can verify exactly what you are  taking. This includes all medications from all doctors and over the counters

## 2024-05-26 NOTE — Progress Notes (Signed)
 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 Tanda Loring. Pt c/o SOB x 4 months. He states he gets SOB if he walks too fast, walks up an incline or does too much work.    doe = MMRC1 = can walk nl pace, flat grade, can't hurry or go up hills or steps s sob mb and back 200 ft slt grade walking back to house but not so sob he has to stop when gets back  Some better after saba/ no need at rest or noct / now rarely using saba  Rec Plan A = Automatic if having bad enough breathing to justify it = symbicort  160 Take 2 puffs first thing in am and then another 2 puffs about 12 hours later.  Work on inhaler technique:    Plan B = Backup Only use your albuterol  (proair ) as a rescue medication    06/27/2022  f/u ov/Kyshawn Teal re: copd    maint on trelegy  200 Chief Complaint  Patient presents with   Follow-up    Review PFT today.  Needs refill for Trelegy.  This helps sx. Doing well.  Dis have episode of A-Fib, now on Eloquis  Dyspnea: MMRC1 = can walk nl pace, flat grade, can't hurry or go uphills or steps s sob   Cough:  hoarseness / some throat clearing but no significant production  Sleeping: well p clonazepam  SABA use: rarely  02: none  Covid status:   never shots/ one infection Lung cancer [facility] :  [facility]  Rec Plan A = Automatic = Always=    Trelegy 100 one click each am  Plan B = Backup (to supplement plan A, not to replace it) Only use your albuterol  inhaler as a rescue medication Plan C = Crisis (instead of Plan B but  only if Plan B stops working) - only use your albuterol  nebulizer if you first try Plan B  Plan D = Doctor - call me if B and C not adequate     06/13/2023  f/u ov/Robert Adams re: GOLD 3 copd maint on duoneb tid/ stopped trelegy   Chief Complaint  Patient presents with   Follow-up    SOB x 6 weeks.  Seen at [facility].  Rx Prednisone  and Ipratropium Bromide/Albuterol  /sulfate 0.5mg /3mg  per 3 ml.  Dyspnea:  mb and back x 200 ft each way uphill back to house stops half way  Cough: barking cough / hoarseness / assoc rhinitis with pnds x years  Sleeping: trazadone on side slt increase HOB electric s  resp cc  SABA use: neb 3 x time  02: none    Rec Stiolto 2 puffs each am until return in 3 months Work on inhaler technique:  try albuterol  15 min before an activity (on alternating days)  that you know would usually make you short of breath(mailbox) Please schedule a follow up visit in 3 months  but call sooner if needed for referral to pulmonary rehab  Add : consider gabapentin for painful neuropathy     Pulmonary rehab last done 04/28/24    05/26/2024  f/u ov/Robert Adams re: GOLD 3 copd  maint on Stiolto   [facility] placed on 02 tanks / just finished doxy one day prior and still taking prednisone  20 mg 3 daily but getting worse by the day  (weaker )  Chief Complaint  Patient presents with   Medical Management of Chronic Issues   COPD    Increased DOE x 6 wks. He gets winded walking short distances such as room to room at home. He states he was hospitalized right after last visit Jan 2025 with covid.   Dyspnea:  Room to room and steady decline since mid novembrer 2025  Cough: none  Sleeping: 30 degrees down to 10 degrees s   resp cc  SABA use: 1-2 x daily / neb for saline only  02: prn     No obvious day to day or daytime variability or assoc excess/ purulent sputum or mucus plugs or hemoptysis or cp or chest tightness, subjective wheeze or overt sinus or hb symptoms.    Also denies  any obvious fluctuation of symptoms with weather or environmental changes or other aggravating or alleviating factors except as outlined above   No unusual exposure hx or h/o childhood pna/ asthma or knowledge of premature birth.  Current Allergies, Complete Past Medical History, Past Surgical History, Family History, and Social History were reviewed in [employer] record.  ROS  The following are not active complaints unless bolded Hoarseness, sore throat, dysphagia (globus) , dental problems, itching, sneezing,  nasal congestion or discharge of excess mucus or purulent secretions, ear ache,   fever, chills, sweats, unintended wt loss or wt gain, classically pleuritic or exertional cp,  orthopnea pnd or arm/hand swelling  or leg swelling, presyncope, palpitations, abdominal pain, anorexia, nausea, vomiting, diarrhea  or change in bowel habits or change in bladder habits, change in stools or change in urine, dysuria, hematuria,  rash, arthralgias, visual complaints, headache, numbness, weakness or ataxia or problems with walking or coordination,  change in mood or  memory.          Outpatient Medications Prior to Visit  Medication Sig Dispense Refill   albuterol  (PROVENTIL  HFA;VENTOLIN  HFA) 108 (90 Base) MCG/ACT inhaler Inhale 2 puffs into the lungs every 6 (six) hours as needed for wheezing or shortness of breath.      apixaban  (ELIQUIS ) 5 MG TABS tablet Take 5 mg by mouth 2 (two) times daily.     augmented betamethasone dipropionate (DIPROLENE-AF) 0.05 % cream Apply 1 Application topically in the morning and at bedtime.     benzonatate  (TESSALON ) 200 MG capsule Take 1 capsule (200 mg total) by mouth 3 (three) times daily as needed for cough. 60 capsule 0   escitalopram  (LEXAPRO ) 10 MG tablet Take 10 mg by mouth daily.     finasteride  (PROSCAR ) 5 MG tablet Take 5 mg by mouth every evening.     fluticasone  (FLONASE) 50 MCG/ACT nasal spray Place 1 spray into both nostrils  daily.     ipratropium (ATROVENT) 0.03 % nasal spray Place 1 spray into both nostrils 2 (two) times daily.     losartan  (COZAAR ) 100 MG tablet Take 100 mg by mouth daily. for high blood pressure     Multiple Vitamins-Minerals (PRESERVISION AREDS 2 PO) Take 1 capsule by mouth daily.  Tamsulosin  HCl (FLOMAX ) 0.4 MG CAPS Take 0.4 mg by mouth at bedtime.     Tiotropium Bromide-Olodaterol (STIOLTO RESPIMAT ) 2.5-2.5 MCG/ACT AERS 2 each am 1 each 11   traZODone  (DESYREL ) 50 MG tablet Take 50 mg by mouth at bedtime.     mometasone (ASMANEX) 220 MCG/ACT inhaler Inhale 2 puffs into the lungs daily.     cyanocobalamin 2000 MCG tablet Take 2,500 mcg by mouth as needed. 2,555mcg     empagliflozin  (JARDIANCE ) 10 MG TABS tablet Take 1 tablet (10 mg total) by mouth daily before breakfast. 30 tablet 0   guaiFENesin -Codeine  100-6.33 MG/5ML SOLN Take 5 mLs by mouth every 6 (six) hours as needed (cough).     losartan  (COZAAR ) 25 MG tablet Take 1 tablet (25 mg total) by mouth every evening. 30 tablet 3   metoprolol  succinate (TOPROL -XL) 25 MG 24 hr tablet Take 1 tablet (25 mg total) by mouth every evening. 30 tablet 3   PACERONE 200 MG tablet Take 200 mg by mouth daily.     rosuvastatin  (CRESTOR ) 40 MG tablet Take 1 tablet (40 mg total) by mouth daily. 30 tablet 3   spironolactone  (ALDACTONE ) 25 MG tablet Take 0.5 tablets (12.5 mg total) by mouth daily. 30 tablet 0   No facility-administered medications prior to visit.         Outpatient Medications Prior to Visit  Medication Sig Dispense Refill   albuterol  (PROVENTIL  HFA;VENTOLIN  HFA) 108 (90 Base) MCG/ACT inhaler Inhale 2 puffs into the lungs every 6 (six) hours as needed for wheezing or shortness of breath.      apixaban  (ELIQUIS ) 5 MG TABS tablet Take 5 mg by mouth 2 (two) times daily.     augmented betamethasone dipropionate (DIPROLENE-AF) 0.05 % cream Apply 1 Application topically in the morning and at bedtime.     benzonatate  (TESSALON ) 200 MG  capsule Take 1 capsule (200 mg total) by mouth 3 (three) times daily as needed for cough. 60 capsule 0   escitalopram  (LEXAPRO ) 10 MG tablet Take 10 mg by mouth daily.     finasteride  (PROSCAR ) 5 MG tablet Take 5 mg by mouth every evening.     fluticasone  (FLONASE) 50 MCG/ACT nasal spray Place 1 spray into both nostrils daily.     ipratropium (ATROVENT) 0.03 % nasal spray Place 1 spray into both nostrils 2 (two) times daily.     losartan  (COZAAR ) 100 MG tablet Take 100 mg by mouth daily. for high blood pressure     Multiple Vitamins-Minerals (PRESERVISION AREDS 2 PO) Take 1 capsule by mouth daily.     Tamsulosin  HCl (FLOMAX ) 0.4 MG CAPS Take 0.4 mg by mouth at bedtime.     Tiotropium Bromide-Olodaterol (STIOLTO RESPIMAT ) 2.5-2.5 MCG/ACT AERS 2 each am 1 each 11   traZODone  (DESYREL ) 50 MG tablet Take 50 mg by mouth at bedtime.     mometasone (ASMANEX) 220 MCG/ACT inhaler Inhale 2 puffs into the lungs daily.     cyanocobalamin 2000 MCG tablet Take 2,500 mcg by mouth as needed. 2,594mcg     empagliflozin  (JARDIANCE ) 10 MG TABS tablet Take 1 tablet (10 mg total) by mouth daily before breakfast. 30 tablet 0   guaiFENesin -Codeine  100-6.33 MG/5ML SOLN Take 5 mLs by mouth every 6 (six) hours as needed (cough).     losartan  (COZAAR ) 25 MG tablet Take 1 tablet (25 mg total) by mouth every evening. 30 tablet 3   metoprolol  succinate (TOPROL -XL) 25 MG 24 hr tablet Take 1 tablet (25 mg  total) by mouth every evening. 30 tablet 3   PACERONE 200 MG tablet Take 200 mg by mouth daily.     rosuvastatin  (CRESTOR ) 40 MG tablet Take 1 tablet (40 mg total) by mouth daily. 30 tablet 3   spironolactone  (ALDACTONE ) 25 MG tablet Take 0.5 tablets (12.5 mg total) by mouth daily. 30 tablet 0   No facility-administered medications prior to visit.    Past Medical History:  Diagnosis Date   Allergy    seasonal   Anxiety    Arthritis    BPH (benign prostatic hyperplasia)    COPD (chronic obstructive pulmonary disease)  (HCC)    Depression    GERD (gastroesophageal reflux disease)    Glaucoma    Hypertension    Sleep apnea     Objective:   Physical Exam    Wts  05/26/2024      162  06/13/2023          165  06/27/2022        168  03/06/2022        170 03/31/2021     166  03/06/2021       164   12/27/16 167 lb (75.8 kg)  10/22/16 165 lb (74.8 kg)  06/24/16 160 lb (72.6 kg)   Vital signs reviewed  05/26/2024  - Note at rest 02 sats  95% on RA   General appearance:    amb anxious wm / mod hoarse, dry cough   HEENT :  Oropharynx  clear   Nasal turbinates nl    NECK :  without JVD/Nodes/TM/ nl carotid upstrokes bilaterally   LUNGS: no acc muscle use,  Mod barrel  contour chest wall with bilateral  Distant bs s audible wheeze and  without cough on insp or exp maneuvers and mod  Hyperresonant  to  percussion bilaterally     CV:  RRR  no s3 or murmur or increase in P2, and no edema   ABD:  soft and nontender with pos mid insp Hoover's  in the supine position. No bruits or organomegaly appreciated, bowel sounds nl  MS:   Ext warm without deformities or   obvious joint restrictions , calf tenderness, cyanosis or clubbing  SKIN: warm and dry without lesions    NEURO:  alert, approp, nl sensorium with  no motor or cerebellar deficits apparent.        Labs ordered/ reviewed:      Chemistry      Component Value Date/Time   NA 137 05/26/2024 1354   NA 138 07/26/2017 1137   K 4.8 05/26/2024 1354   CL 100 05/26/2024 1354   CO2 26 05/26/2024 1354   BUN 32 (H) 05/26/2024 1354   BUN 19 07/26/2017 1137   CREATININE 0.95 05/26/2024 1354      Component Value Date/Time   CALCIUM  10.0 05/26/2024 1354   ALKPHOS 62 06/19/2023 0048   AST 33 06/19/2023 0048   ALT 27 06/19/2023 0048   BILITOT 0.8 06/19/2023 0048   BILITOT 0.5 08/23/2017 0958        Lab Results  Component Value Date   WBC 15.6 (H) 05/26/2024   HGB 15.6 05/26/2024   HCT 47.5 05/26/2024   MCV 89.2 05/26/2024   PLT 373.0  05/26/2024        Lab Results  Component Value Date   TSH 1.05 05/26/2024     Lab Results  Component Value Date   PROBNP 88.0 05/26/2024  Lab Results  Component Value Date   ESRSEDRATE 1 05/26/2024        Assessment & Plan COPD GOLD III  DOE (dyspnea on exertion) Dyspnea on exertion much worse since mid Nov 2025 ? Etiology  - 05/26/2024   Walked on RA  x  2  lap(s) =  approx 500  ft  @ mod pace, stopped due to sob  with lowest 02 sats 91%   Symptoms (doe across the room)  are markedly disproportionate to objective findings and not clear to what extent this is actually a pulmonary  problem but pt does appear to have difficult to sort out respiratory symptoms of unknown origin for which  DDX  = almost all start with A and  include Adherence, Ace Inhibitors, Acid Reflux, Active Sinus Disease, Alpha 1 Antitripsin deficiency, Anxiety masquerading as Airways dz,  ABPA,  Allergy(esp in young), Aspiration (esp in elderly), Adverse effects of meds,  Active smoking or Vaping, A bunch of PE's/clot burden (a few small clots can't cause this syndrome unless there is already severe underlying pulm or vascular dz with poor reserve),  Anemia or thyroid  disorder, plus two Bs  = Bronchiectasis and Beta blocker use..and one C= CHF   Labs have excluded most the items in the DDx  except for adverse drug effects (appears to be seeing multiple providers from different centers and not sure we have accurte med rec at this point.  Also difficult to r/o gerd and anxiety from so much pred rx so rec  >>> max gerd rx   >>> wean pred down over several week, using baselin walk and sats today as comparison    COPD with acute exacerbation (HCC) - Quit smoking 2012 MZ with borderline adequate levels = Spirometry 12/27/2016  FEV1 1.45 (45%)  Ratio 47  With no rx prior  - 03/06/2021  D/c trelegy 200 and trial of symb 80 2bid with prn saba  - 03/06/2021   Walked on RA x  3  lap(s) =  approx 750 @ fast pace,  stopped due to sob with lowest 02 sats 89% - 03/13/21  CTa  Neg x for ASCVD/ emphysema  - Labs ordered 03/31/2021  :  allergy profile IgE  20  Eos 0.1    alpha one AT phenotype  MZ / level 94 so rec pft then consider replacement rx  - PFT's  06/27/2022   FEV1 1.40 (48 % ) ratio 0.50   p Trelegy 100/saba prior to study with DLCO  9.98 (40%)   and FV curve classically concave    - 06/13/2023  After extensive coaching inhaler device,  effectiveness =    75% with smi > try stiolto x 2 weeks and call for referral to pulmonary rehab if willing  06/13/2023   Walked on RA  x  2  lap(s) =  approx 500  ft  @ brisk pace, stopped due to feet hurt with lowest 02 sats 94%    Continue stiolto and see if steroids can be tapered with approp rx with saba:  Re SABA :  I spent extra time with pt today reviewing appropriate use of albuterol  for prn use on exertion with the following points: 1) saba is for relief of sob that does not improve by walking a slower pace or resting but rather if the pt does not improve after trying this first. 2) If the pt is convinced, as many are, that saba helps recover from activity faster then it's  easy to tell if this is the case by re-challenging : ie stop, take the inhaler, then p 5 minutes try the exact same activity (intensity of workload) that just caused the symptoms and see if they are substantially diminished or not after saba 3) if there is an activity that reproducibly causes the symptoms, try the saba 15 min before the activity on alternate days   If in fact the saba really does help, then fine to continue to use it prn but advised may need to look closer at the maintenance regimen being used to achieve better control of airways disease with exertion.   F/u in 4 weeks with all meds in hand using a trust but verify approach to confirm accurate Medication  Reconciliation The principal here is that until we are certain that the  patients are doing what we've asked, it makes no sense  to ask them to do more.  Call with discrepancies with med list in meantime.   Each maintenance medication was reviewed in detail including emphasizing most importantly the difference between maintenance and prns and under what circumstances the prns are to be triggered using an action plan format where appropriate.  Total time for H and P, chart review, counseling, reviewing hfa/SMI/ neb/ 02/ pulse ox  device(s) , directly observing portions of ambulatory 02 saturation study/ and generating customized AVS unique to this office visit / same day charting = 45 min for multiple  refractory respiratory  symptoms of uncertain etiology                  AVS  Patient Instructions  Stop asmanex  Plan A = Automatic = Always=    Stiolto 2 puff each am   Plan B = Backup (to supplement plan A, not to replace it) Use your albuterol  inhaler as a rescue medication to be used if you can't catch your breath by resting or slowing your pace  or doing a relaxed purse lip breathing pattern.  - The less you use it, the better it will work when you need it. - Ok to use the inhaler up to 2 puffs  every 4 hours if you must but call for appointment if use goes up over your usual need - Don't leave home without it !!  (think of it like the spare tire or starter fluid for your car)   Plan C = Crisis (instead of Plan B but only if Plan B stops working) - only use your albuterol  nebulizer if you first try Plan B and it fails to help > ok to use the nebulizer up to every 4 hours but if start needing it regularly call for immediate appointment  Also  Ok to try albuterol  15 min before an activity (on alternating days)  that you know would usually make you short of breath and see if it makes any difference and if makes none then don't take albuterol  after activity unless you can't catch your breath as this means it's the resting that helps, not the albuterol .      Make sure you check your oxygen  saturation at your  highest level of activity(NOT after you stop)  to be sure it stays over 90% and keep track of it at least once a week, more often if breathing getting worse, and let me know if losing ground. (Collect the dots to connect the dots approach)    Pantoprazole  (protonix ) 40 mg   Take  30-60 min before first meal of  the day and Pepcid  (famotidine )  20 mg after supper until return to office - this is the best way to tell whether stomach acid is contributing to your problem.    GERD (REFLUX)  is an extremely common cause of respiratory symptoms just like yours , many times with no obvious heartburn at all.    It can be treated with medication, but also with lifestyle changes including elevation of the head of your bed (ideally with 6 -8inch blocks under the headboard of your bed),  Smoking cessation, avoidance of late meals, excessive alcohol, and avoid fatty foods, chocolate, peppermint, colas, red wine, and acidic juices such as orange juice.  NO MINT OR MENTHOL  PRODUCTS SO NO COUGH DROPS - LUDENs USE SUGARLESS CANDY INSTEAD (Jolley ranchers or Stover's or Environmental Manager) or even ice chips will also do - the key is to swallow to prevent all throat clearing. NO OIL BASED VITAMINS - use powdered substitutes.  Avoid fish oil when coughing.  Prednisone  20 mg 2 x 2 days then 1 x 2 days the one half x a week then try off.  Please remember to go to the  x-ray department  for your tests - we will call you with the results when they are available   Please remember to go to the lab department   for your tests - we will call you with the results when they are available.      Please schedule a follow up office visit in 4 weeks, sooner if needed  with all medications /inhalers/ solutions in hand so we can verify exactly what you are taking. This includes all medications from all doctors and over the counters            Ozell America, MD 05/27/2024           CXR PA and Lateral:   05/26/2024 :    I  personally reviewed images and impression is as follows:     Mild / mod copd / no acute findings  Assessment:          "

## 2024-05-27 ENCOUNTER — Ambulatory Visit: Payer: Self-pay | Admitting: Internal Medicine

## 2024-05-27 LAB — IGE: IgE (Immunoglobulin E), Serum: 20 kU/L

## 2024-05-27 NOTE — Assessment & Plan Note (Addendum)
-   Quit smoking 2012 MZ with borderline adequate levels = Spirometry 12/27/2016  FEV1 1.45 (45%)  Ratio 47  With no rx prior  - 03/06/2021  D/c trelegy 200 and trial of symb 80 2bid with prn saba  - 03/06/2021   Walked on RA x  3  lap(s) =  approx 750 @ fast pace, stopped due to sob with lowest 02 sats 89% - 03/13/21  CTa  Neg x for ASCVD/ emphysema  - Labs ordered 03/31/2021  :  allergy profile IgE  20  Eos 0.1    alpha one AT phenotype  MZ / level 94 so rec pft then consider replacement rx  - PFT's  06/27/2022   FEV1 1.40 (48 % ) ratio 0.50   p Trelegy 100/saba prior to study with DLCO  9.98 (40%)   and FV curve classically concave    - 06/13/2023  After extensive coaching inhaler device,  effectiveness =    75% with smi > try stiolto x 2 weeks and call for referral to pulmonary rehab if willing  06/13/2023   Walked on RA  x  2  lap(s) =  approx 500  ft  @ brisk pace, stopped due to feet hurt with lowest 02 sats 94%    Continue stiolto and see if steroids can be tapered with approp rx with saba:  Re SABA :  I spent extra time with pt today reviewing appropriate use of albuterol  for prn use on exertion with the following points: 1) saba is for relief of sob that does not improve by walking a slower pace or resting but rather if the pt does not improve after trying this first. 2) If the pt is convinced, as many are, that saba helps recover from activity faster then it's easy to tell if this is the case by re-challenging : ie stop, take the inhaler, then p 5 minutes try the exact same activity (intensity of workload) that just caused the symptoms and see if they are substantially diminished or not after saba 3) if there is an activity that reproducibly causes the symptoms, try the saba 15 min before the activity on alternate days   If in fact the saba really does help, then fine to continue to use it prn but advised may need to look closer at the maintenance regimen being used to achieve better control of  airways disease with exertion.   F/u in 4 weeks with all meds in hand using a trust but verify approach to confirm accurate Medication  Reconciliation The principal here is that until we are certain that the  patients are doing what we've asked, it makes no sense to ask them to do more.  Call with discrepancies with med list in meantime.   Each maintenance medication was reviewed in detail including emphasizing most importantly the difference between maintenance and prns and under what circumstances the prns are to be triggered using an action plan format where appropriate.  Total time for H and P, chart review, counseling, reviewing hfa/SMI/ neb/ 02/ pulse ox  device(s) , directly observing portions of ambulatory 02 saturation study/ and generating customized AVS unique to this office visit / same day charting = 45 min for multiple  refractory respiratory  symptoms of uncertain etiology

## 2024-05-27 NOTE — Assessment & Plan Note (Addendum)
 Dyspnea on exertion much worse since mid Nov 2025 ? Etiology  - 05/26/2024   Walked on RA  x  2  lap(s) =  approx 500  ft  @ mod pace, stopped due to sob  with lowest 02 sats 91%   Symptoms (doe across the room)  are markedly disproportionate to objective findings and not clear to what extent this is actually a pulmonary  problem but pt does appear to have difficult to sort out respiratory symptoms of unknown origin for which  DDX  = almost all start with A and  include Adherence, Ace Inhibitors, Acid Reflux, Active Sinus Disease, Alpha 1 Antitripsin deficiency, Anxiety masquerading as Airways dz,  ABPA,  Allergy(esp in young), Aspiration (esp in elderly), Adverse effects of meds,  Active smoking or Vaping, A bunch of PE's/clot burden (a few small clots can't cause this syndrome unless there is already severe underlying pulm or vascular dz with poor reserve),  Anemia or thyroid  disorder, plus two Bs  = Bronchiectasis and Beta blocker use..and one C= CHF   Labs have excluded most the items in the DDx  except for adverse drug effects (appears to be seeing multiple providers from different centers and not sure we have accurte med rec at this point.  Also difficult to r/o gerd and anxiety from so much pred rx so rec  >>> max gerd rx   >>> wean pred down over several week, using baselin walk and sats today as comparison

## 2024-06-02 ENCOUNTER — Encounter (HOSPITAL_COMMUNITY)

## 2024-06-09 ENCOUNTER — Encounter (HOSPITAL_COMMUNITY)

## 2024-06-11 ENCOUNTER — Encounter (HOSPITAL_COMMUNITY)

## 2024-06-16 ENCOUNTER — Telehealth: Payer: Self-pay

## 2024-06-16 ENCOUNTER — Encounter (HOSPITAL_COMMUNITY)

## 2024-06-16 NOTE — Telephone Encounter (Signed)
 Atc pt x1, left vm to call back

## 2024-06-16 NOTE — Telephone Encounter (Unsigned)
 Copied from CRM #[id number]. Topic: Clinical - Lab/Test Results >> Jun 12, 2024 12:58 PM Joesph PARAS wrote: Reason for CRM: Patient's spouse is requesting Dr. Chari nurse give her a call regarding some recent x-ray result concerns. Please return call.

## 2024-06-17 NOTE — Telephone Encounter (Signed)
 Spoke with patient & wife made Acute OV with another MD since you had nothing sooner  - requested all Rx to be brought to the appointment and advise if things become worse to seek medical attention ED / UC   -NFN

## 2024-06-18 ENCOUNTER — Encounter (HOSPITAL_COMMUNITY)

## 2024-06-23 ENCOUNTER — Encounter (HOSPITAL_COMMUNITY)

## 2024-06-24 ENCOUNTER — Ambulatory Visit (INDEPENDENT_AMBULATORY_CARE_PROVIDER_SITE_OTHER): Admitting: Pulmonary Disease

## 2024-06-24 ENCOUNTER — Encounter: Payer: Self-pay | Admitting: Pulmonary Disease

## 2024-06-24 VITALS — BP 128/72 | HR 78 | Ht 70.0 in | Wt 156.0 lb

## 2024-06-24 DIAGNOSIS — J986 Disorders of diaphragm: Secondary | ICD-10-CM | POA: Diagnosis not present

## 2024-06-24 DIAGNOSIS — J449 Chronic obstructive pulmonary disease, unspecified: Secondary | ICD-10-CM

## 2024-06-24 DIAGNOSIS — R0602 Shortness of breath: Secondary | ICD-10-CM

## 2024-06-24 DIAGNOSIS — U099 Post covid-19 condition, unspecified: Secondary | ICD-10-CM

## 2024-06-24 MED ORDER — ARFORMOTEROL TARTRATE 15 MCG/2ML IN NEBU: 15.0000 ug | INHALATION_SOLUTION | Freq: Two times a day (BID) | RESPIRATORY_TRACT | 6 refills | Status: DC

## 2024-06-24 MED ORDER — REVEFENACIN 175 MCG/3ML IN SOLN: 175.0000 ug | Freq: Every day | RESPIRATORY_TRACT | 11 refills | Status: DC

## 2024-06-24 MED ORDER — BUDESONIDE 0.5 MG/2ML IN SUSP: 0.5000 mg | Freq: Two times a day (BID) | RESPIRATORY_TRACT | 11 refills | Status: AC

## 2024-06-24 NOTE — Patient Instructions (Addendum)
 Start budesonide  nebulizer twice daily  Start brovana  nebuzlier twice daily  Start yupelri  neb daily  Start ohtuvayre  nebulizer twice daily  Recommend following up with your heart doctor regarding possible heart failure  Schedule pulmonary function tests at the front desk   Follow up with Dr. Darlean as scheduled

## 2024-06-24 NOTE — Progress Notes (Signed)
 "  Established Patient Pulmonology Office Visit   Subjective:  Patient ID: Robert Adams, male    DOB: 1945-11-18  MRN: [id number]  CC:  Chief Complaint  Patient presents with   Medical Management of Chronic Issues    Acute- SOB     Discussed the use of AI scribe software for clinical note transcription with the patient, who gave verbal consent to proceed.  History of Present Illness Robert Adams is an 79 year old male with COPD who presents with worsening shortness of breath. He is accompanied by a family member who provides additional information about his medical history.  He reports progressive shortness of breath since October that worsened after a COVID-19 infection with hospitalization at [facility] in January 2025. At home he becomes severely winded after about five steps.  He uses Stiolto two puffs daily and albuterol  and nebulizer as needed. He feels inhalers are ineffective and is relying more on the nebulizer. Prior trials of Trelegy, Breztri, and prednisone  did not improve his breathing. He uses supplemental oxygen  intermittently, with morning saturations about 93% that increase to 96-97% on oxygen . He completed some cardiopulmonary rehab but stopped because of dyspnea.  He had a heart ablation in August that did not improve his breathing. His heart function was 20-25% about a year ago and on updated echo 01/2024 EF imprved to 50%. He denies blood clots. He uses a cardiac monitoring device and has not had AFib since surgery. Resting oxygen  levels are usually at least 95%, yet he still has marked exertional shortness of breath.  Sniff test exams last year show impairment of right hemidiaphragm.        ROS   Current Medications[1]      Objective:  BP 128/72   Pulse 78   Ht 5' 10 (1.778 m)   Wt 156 lb (70.8 kg)   SpO2 94%   BMI 22.38 kg/m     Physical Exam Constitutional:      General: He is not in acute distress.    Appearance: Normal appearance.   Eyes:     General: No scleral icterus.    Conjunctiva/sclera: Conjunctivae normal.  Cardiovascular:     Rate and Rhythm: Normal rate and regular rhythm.  Pulmonary:     Breath sounds: No wheezing, rhonchi or rales.  Musculoskeletal:     Right lower leg: No edema.     Left lower leg: No edema.  Skin:    General: Skin is warm and dry.  Neurological:     General: No focal deficit present.      Diagnostic Review:  Last CBC Lab Results  Component Value Date   WBC 15.6 (H) 05/26/2024   HGB 15.6 05/26/2024   HCT 47.5 05/26/2024   MCV 89.2 05/26/2024   MCH 29.5 06/21/2023   RDW 14.3 05/26/2024   PLT 373.0 05/26/2024   Last metabolic panel Lab Results  Component Value Date   GLUCOSE 119 (H) 05/26/2024   NA 137 05/26/2024   K 4.8 05/26/2024   CL 100 05/26/2024   CO2 26 05/26/2024   BUN 32 (H) 05/26/2024   CREATININE 0.95 05/26/2024   GFR 75.36 05/26/2024   CALCIUM  10.0 05/26/2024   PHOS 4.2 06/19/2023   PROT 5.6 (L) 06/19/2023   ALBUMIN  3.0 (L) 06/19/2023   BILITOT 0.8 06/19/2023   ALKPHOS 62 06/19/2023   AST 33 06/19/2023   ALT 27 06/19/2023   ANIONGAP 6 06/21/2023   CT Chest 05/30/24 LUNGS/PLEURA:  -  Emphysema.  - No pneumothorax.  - No pulmonary masses.  - No pleural effusions.   FL CHEST FLUOROSCOPY, 02/21/2024 10:39 AM  INDICATION: Disorders of diaphragm \ J98.6 Disorders of diaphragm  COMPARISON: [place] chest fluoroscopy 01/29/2024, 1 V PCXR 02/18/2026  TECHNIQUE: Multiple fluoroscopic images were obtained of the chest during inspiration, expiration, and with short and prolonged sniff/inspiration.  FINDINGS:  Both hemidiaphragms move inferiorly with inspiration.  The degree of movement is much smaller with the right hemidiaphragm with paradoxical motion with sniff, unchanged from previous exam on 01/29/2024.     Assessment & Plan:   Assessment & Plan COPD GOLD III  Orders:   Ambulatory referral to Pharmacotherapy Clinic   budesonide  (PULMICORT ) 0.5  MG/2ML nebulizer solution; Take 2 mLs (0.5 mg total) by nebulization 2 (two) times daily.   arformoterol  (BROVANA ) 15 MCG/2ML NEBU; Take 2 mLs (15 mcg total) by nebulization 2 (two) times daily.   revefenacin  (YUPELRI ) 175 MCG/3ML nebulizer solution; Take 3 mLs (175 mcg total) by nebulization daily.   Spirometry with graph; Future  Shortness of breath      Assessment and Plan Assessment & Plan Chronic obstructive pulmonary disease Severe COPD with increased dyspnea post-COVID-19. Current inhaler regimen ineffective. Oxygen  saturation 93-95%, occasionally needs supplemental oxygen . Differential includes pulmonary hypertension. - Switched to nebulizer regimen: budesonide , brovana  and yupelri  - Initiated Ohtuvayre  for COPD. - Scheduled pulmonary function tests. - Consider right heart catheterization if nebulizer therapy is ineffective to evaluate for pulmonary hypertension  Right hemidiaphragm dysfunction Persistent dysfunction with reduced movement compared to the left, unchanged from previous assessments. - contributes to dyspnea  Post-COVID-19 condition Persistent dyspnea and reduced lung function post-COVID-19. Possible acute decrease in heart function during infection with recovery. Current lung function may have a new lower baseline. - Continue to monitor lung function and symptoms. - Encouraged physical activity to improve conditioning and oxygen  utilization.      No follow-ups on file.   Dorn KATHEE Chill, MD     [1]  Current Outpatient Medications:    albuterol  (PROVENTIL  HFA;VENTOLIN  HFA) 108 (90 Base) MCG/ACT inhaler, Inhale 2 puffs into the lungs every 6 (six) hours as needed for wheezing or shortness of breath. , Disp: , Rfl:    apixaban  (ELIQUIS ) 5 MG TABS tablet, Take 5 mg by mouth 2 (two) times daily., Disp: , Rfl:    arformoterol  (BROVANA ) 15 MCG/2ML NEBU, Take 2 mLs (15 mcg total) by nebulization 2 (two) times daily., Disp: 120 mL, Rfl: 6   augmented  betamethasone dipropionate (DIPROLENE-AF) 0.05 % cream, Apply 1 Application topically in the morning and at bedtime., Disp: , Rfl:    benzonatate  (TESSALON ) 200 MG capsule, Take 1 capsule (200 mg total) by mouth 3 (three) times daily as needed for cough., Disp: 60 capsule, Rfl: 0   budesonide  (PULMICORT ) 0.5 MG/2ML nebulizer solution, Take 2 mLs (0.5 mg total) by nebulization 2 (two) times daily., Disp: 120 mL, Rfl: 11   DULoxetine (CYMBALTA) 30 MG capsule, Take 60 mg by mouth., Disp: , Rfl:    escitalopram  (LEXAPRO ) 10 MG tablet, Take 10 mg by mouth daily., Disp: , Rfl:    famotidine  (PEPCID ) 20 MG tablet, Take 20 mg by mouth., Disp: , Rfl:    finasteride  (PROSCAR ) 5 MG tablet, Take 5 mg by mouth every evening., Disp: , Rfl:    fluticasone  (FLONASE) 50 MCG/ACT nasal spray, Place 1 spray into both nostrils daily., Disp: , Rfl:    ipratropium (ATROVENT) 0.03 % nasal spray, Place 1  spray into both nostrils 2 (two) times daily., Disp: , Rfl:    losartan  (COZAAR ) 100 MG tablet, Take 100 mg by mouth daily. for high blood pressure, Disp: , Rfl:    Multiple Vitamins-Minerals (PRESERVISION AREDS 2 PO), Take 1 capsule by mouth daily., Disp: , Rfl:    revefenacin  (YUPELRI ) 175 MCG/3ML nebulizer solution, Take 3 mLs (175 mcg total) by nebulization daily., Disp: 90 mL, Rfl: 11   sertraline (ZOLOFT) 25 MG tablet, Take 25 mg by mouth., Disp: , Rfl:    Tamsulosin  HCl (FLOMAX ) 0.4 MG CAPS, Take 0.4 mg by mouth at bedtime., Disp: , Rfl:    traZODone  (DESYREL ) 50 MG tablet, Take 50 mg by mouth at bedtime., Disp: , Rfl:   "

## 2024-06-25 ENCOUNTER — Telehealth: Payer: Self-pay

## 2024-06-25 ENCOUNTER — Other Ambulatory Visit: Payer: Self-pay

## 2024-06-25 ENCOUNTER — Encounter (HOSPITAL_COMMUNITY)

## 2024-06-25 DIAGNOSIS — J449 Chronic obstructive pulmonary disease, unspecified: Secondary | ICD-10-CM

## 2024-06-25 MED ORDER — ARFORMOTEROL TARTRATE 15 MCG/2ML IN NEBU: 15.0000 ug | INHALATION_SOLUTION | Freq: Two times a day (BID) | RESPIRATORY_TRACT | 6 refills | Status: AC

## 2024-06-25 MED ORDER — REVEFENACIN 175 MCG/3ML IN SOLN: 175.0000 ug | Freq: Every day | RESPIRATORY_TRACT | 11 refills | Status: AC

## 2024-06-25 NOTE — Telephone Encounter (Signed)
 Copied from CRM #[id number]. Topic: Clinical - Prescription Issue >> Jun 25, 2024  2:10 PM Robert Adams wrote: Reason for CRM: Patient's wife, Robert Adams, is having issues getting patient's arformoterol  (BROVANA ) 15 MCG/2ML NEBU, budesonide  (PULMICORT ) 0.5 MG/2ML nebulizer solution, and revefenacin  (YUPELRI ) 175 MCG/3ML nebulizer solution filled through [facility]. She would like a call from nurse.   Spoke with patient Robert Adams, Rx sent to [facility] as requested by patient

## 2024-06-25 NOTE — Telephone Encounter (Signed)
 Ohtuvayre  paperwork handed into pharmacy

## 2024-06-25 NOTE — Telephone Encounter (Signed)
 Received referral for new start Ohtuvayre . Opening benefits investigation in this thread.

## 2024-06-30 ENCOUNTER — Other Ambulatory Visit (HOSPITAL_COMMUNITY): Payer: Self-pay

## 2024-06-30 ENCOUNTER — Telehealth: Payer: Self-pay

## 2024-06-30 ENCOUNTER — Encounter (HOSPITAL_COMMUNITY)

## 2024-06-30 NOTE — Telephone Encounter (Signed)
*  Pulm  Pharmacy Patient Advocate Encounter   Received notification from Fax that prior authorization for Arformoterol  Neb Sol is required/requested.   Insurance verification completed.   The patient is insured through [facility] [employer]/RX ADVANCE.   Per test claim: PA required; PA submitted to above mentioned insurance via Latent Key/confirmation #/EOC [id number] Status is pending

## 2024-06-30 NOTE — Telephone Encounter (Signed)
 A user error has taken place: encounter opened in error, closed for administrative reasons.

## 2024-07-01 ENCOUNTER — Telehealth: Payer: Self-pay

## 2024-07-01 NOTE — Telephone Encounter (Signed)
 Received Ohtuvayre  new start paperwork. Pt is veteran. Completed VA form and faxed with clinicals to [facility].   Phone#: [phone] Fax#: [phone]

## 2024-07-01 NOTE — Telephone Encounter (Signed)
 Your request has been approved [id number] Arformoterol  Tartrate 15MCG/2ML IN NEBU Quantity:120

## 2024-07-01 NOTE — Telephone Encounter (Signed)
 Copied from CRM [id number]. Topic: Clinical - Prescription Issue >> Jul 01, 2024 11:21 AM [name] PARAS wrote: Reason for CRM:   Pt's wife is contacting clinic regarding his Ohtuvayre . It was not sent into the [facility] pharmacy on file.   Requested call back with status update once med is sent  CB#  [phone]   Spoke with patient wife Robert Adams, Rx was Faxed to [place]

## 2024-07-02 ENCOUNTER — Encounter (HOSPITAL_COMMUNITY)

## 2024-07-07 ENCOUNTER — Encounter (HOSPITAL_COMMUNITY)

## 2024-07-09 ENCOUNTER — Encounter (HOSPITAL_COMMUNITY)

## 2024-07-14 ENCOUNTER — Ambulatory Visit: Admitting: Internal Medicine

## 2024-07-14 ENCOUNTER — Encounter (HOSPITAL_COMMUNITY)

## 2024-07-16 ENCOUNTER — Encounter (HOSPITAL_COMMUNITY)
